# Patient Record
Sex: Female | Born: 1995 | Race: Black or African American | Hispanic: No | Marital: Single | Smoking: Never smoker
Health system: Southern US, Community
[De-identification: ages and names within clinical notes are randomized; demographics above are authoritative.]

## PROBLEM LIST (undated history)

## (undated) ENCOUNTER — Inpatient Hospital Stay (HOSPITAL_COMMUNITY): Payer: Self-pay

## (undated) DIAGNOSIS — O24419 Gestational diabetes mellitus in pregnancy, unspecified control: Secondary | ICD-10-CM

## (undated) DIAGNOSIS — Z8709 Personal history of other diseases of the respiratory system: Secondary | ICD-10-CM

## (undated) DIAGNOSIS — R0600 Dyspnea, unspecified: Secondary | ICD-10-CM

## (undated) DIAGNOSIS — R51 Headache: Secondary | ICD-10-CM

## (undated) DIAGNOSIS — R55 Syncope and collapse: Secondary | ICD-10-CM

## (undated) DIAGNOSIS — Z8659 Personal history of other mental and behavioral disorders: Secondary | ICD-10-CM

## (undated) HISTORY — DX: Gestational diabetes mellitus in pregnancy, unspecified control: O24.419

## (undated) HISTORY — DX: Dyspnea, unspecified: R06.00

## (undated) HISTORY — PX: NO PAST SURGERIES: SHX2092

## (undated) HISTORY — DX: Personal history of other mental and behavioral disorders: Z86.59

## (undated) HISTORY — DX: Headache: R51

## (undated) HISTORY — DX: Personal history of other diseases of the respiratory system: Z87.09

---
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
## 2013-03-07 ENCOUNTER — Inpatient Hospital Stay (HOSPITAL_COMMUNITY)
Admission: AD | Admit: 2013-03-07 | Discharge: 2013-03-07 | Disposition: A | Discharge status: Home / Self Care | Payer: Medicaid Other | Source: Ambulatory Visit | Attending: Obstetrics and Gynecology | Admitting: Obstetrics and Gynecology

## 2013-03-07 ENCOUNTER — Encounter (HOSPITAL_COMMUNITY): Payer: Self-pay

## 2013-03-07 ENCOUNTER — Inpatient Hospital Stay (HOSPITAL_COMMUNITY): Payer: Medicaid Other

## 2013-03-07 DIAGNOSIS — N926 Irregular menstruation, unspecified: Secondary | ICD-10-CM | POA: Insufficient documentation

## 2013-03-07 DIAGNOSIS — R109 Unspecified abdominal pain: Secondary | ICD-10-CM | POA: Insufficient documentation

## 2013-03-07 DIAGNOSIS — N946 Dysmenorrhea, unspecified: Secondary | ICD-10-CM

## 2013-03-07 DIAGNOSIS — N921 Excessive and frequent menstruation with irregular cycle: Secondary | ICD-10-CM

## 2013-03-07 DIAGNOSIS — N920 Excessive and frequent menstruation with regular cycle: Secondary | ICD-10-CM

## 2013-03-07 LAB — HCG, QUANTITATIVE, PREGNANCY: hCG, Beta Chain, Quant, S: <1 m[IU]/mL (ref <5)

## 2013-03-07 LAB — CBC
HCT: 38 % (ref 36.0–46.0)
MCHC: 33.4 g/dL (ref 30.0–36.0)
RDW: 14.5 % (ref 11.5–15.5)

## 2013-03-07 LAB — WET PREP, GENITAL
Trich, Wet Prep: NONE SEEN
Yeast Wet Prep HPF POC: NONE SEEN

## 2013-03-07 LAB — URINALYSIS, ROUTINE W REFLEX MICROSCOPIC
Nitrite: NEGATIVE
Protein, ur: NEGATIVE mg/dL
Urobilinogen, UA: 0.2 mg/dL (ref 0.0–1.0)

## 2013-03-07 LAB — URINE MICROSCOPIC-ADD ON

## 2013-03-07 LAB — ABO/RH: ABO/RH(D): O POS

## 2013-03-07 MED ORDER — NORGESTIMATE-ETH ESTRADIOL 0.25-35 MG-MCG PO TABS: 1.0000 | ORAL_TABLET | Freq: Every day | ORAL | Status: DC

## 2013-03-07 MED ORDER — IBUPROFEN 600 MG PO TABS: 600.0000 mg | ORAL_TABLET | Freq: Four times a day (QID) | ORAL | Status: DC | PRN

## 2013-03-07 NOTE — MAU Note (Signed)
Pt states here for abd pain in upper left side of abdomen that is constant, and lower abd pain that is intermittent. Used restroom yesterday and showed RN picture of clot/tissue?. Pt last had negative upt at home beginning of October.

## 2013-03-07 NOTE — MAU Provider Note (Signed)
Chief Complaint: Abdominal Pain   None    SUBJECTIVE HPI: Erin Wilkinson is a 17 y.o. G1P1000 who presents to maternity admissions reporting abdominal cramping and passing of a large clot, suspicious for miscarriage, in the toilet 2 days ago. She brought in photos and a video of the blood clot in the toilet.  She reports she tried to bring it in but it scared her and she dropped it back in the toilet. She reports her periods were regular until 4 months ago, and since July she has had only 2 episodes of bleeding which have been unusual since, one with just pink spotting x3-4 days, the other with heavy bleeding and clots x3 days.  She denies vaginal itching/burning, urinary symptoms, h/a, dizziness, n/v, or fever/chills.    Past Medical History  Diagnosis Date  . History of chronic bronchitis   . Asthma   . Headache(784.0)   . History of depression   . Gestational diabetes mellitus in pregnancy     new diagnosis 01/2011   History reviewed. No pertinent past surgical history. History   Social History  . Marital Status: Single    Spouse Name: N/A    Number of Children: N/A  . Years of Education: N/A   Occupational History  . Not on file.   Social History Main Topics  . Smoking status: Never Smoker   . Smokeless tobacco: Never Used  . Alcohol Use: No  . Drug Use: No  . Sexual Activity: Yes   Other Topics Concern  . Not on file   Social History Narrative  . No narrative on file   No current facility-administered medications on file prior to encounter.   No current outpatient prescriptions on file prior to encounter.   No Known Allergies  ROS: Pertinent items in HPI  OBJECTIVE Blood pressure 102/74, pulse 86, temperature 97.6 F (36.4 C), temperature source Oral, resp. rate 18, height 5\' 1"  (1.549 m), weight 96.276 kg (212 lb 4 oz). GENERAL: Well-developed, well-nourished female in no acute distress.  HEENT: Normocephalic HEART: normal rate RESP: normal  effort ABDOMEN: Soft, non-tender EXTREMITIES: Nontender, no edema NEURO: Alert and oriented Pelvic exam: Cervix pink, visually closed, without lesion, scant white creamy discharge, trace light brown bleeding, vaginal walls and external genitalia normal Bimanual exam: Cervix 0/long/high, firm, anterior, neg CMT, uterus nontender, nonenlarged, adnexa without tenderness, enlargement, or mass  LAB RESULTS Results for orders placed during the hospital encounter of 03/07/13 (from the past 24 hour(s))  URINALYSIS, ROUTINE W REFLEX MICROSCOPIC     Status: Abnormal   Collection Time    03/07/13 11:49 AM      Result Value Range   Color, Urine YELLOW  YELLOW   APPearance CLEAR  CLEAR   Specific Gravity, Urine >1.030 (*) 1.005 - 1.030   pH 6.0  5.0 - 8.0   Glucose, UA NEGATIVE  NEGATIVE mg/dL   Hgb urine dipstick LARGE (*) NEGATIVE   Bilirubin Urine NEGATIVE  NEGATIVE   Ketones, ur NEGATIVE  NEGATIVE mg/dL   Protein, ur NEGATIVE  NEGATIVE mg/dL   Urobilinogen, UA 0.2  0.0 - 1.0 mg/dL   Nitrite NEGATIVE  NEGATIVE   Leukocytes, UA SMALL (*) NEGATIVE  URINE MICROSCOPIC-ADD ON     Status: Abnormal   Collection Time    03/07/13 11:49 AM      Result Value Range   Squamous Epithelial / LPF FEW (*) RARE   WBC, UA 3-6  <3 WBC/hpf   RBC / HPF  3-6  <3 RBC/hpf   Bacteria, UA MANY (*) RARE   Urine-Other MUCOUS PRESENT    HCG, QUANTITATIVE, PREGNANCY     Status: None   Collection Time    03/07/13 12:24 PM      Result Value Range   hCG, Beta Chain, Quant, S <1  <5 mIU/mL  ABO/RH     Status: None   Collection Time    03/07/13 12:24 PM      Result Value Range   ABO/RH(D) O POS    CBC     Status: Abnormal   Collection Time    03/07/13 12:30 PM      Result Value Range   WBC 10.3  4.0 - 10.5 K/uL   RBC 5.33 (*) 3.87 - 5.11 MIL/uL   Hemoglobin 12.7  12.0 - 15.0 g/dL   HCT 57.8  46.9 - 62.9 %   MCV 71.3 (*) 78.0 - 100.0 fL   MCH 23.8 (*) 26.0 - 34.0 pg   MCHC 33.4  30.0 - 36.0 g/dL   RDW 52.8   41.3 - 24.4 %   Platelets 275  150 - 400 K/uL    IMAGING US Transvaginal Non-ob  03/07/2013   CLINICAL DATA:  Abnormal vaginal bleeding. Large clots. LMP July 2014.  EXAM: TRANSABDOMINAL AND TRANSVAGINAL ULTRASOUND OF PELVIS  TECHNIQUE: Both transabdominal and transvaginal ultrasound examinations of the pelvis were performed. Transabdominal technique was performed for global imaging of the pelvis including uterus, ovaries, adnexal regions, and pelvic cul-de-sac. It was necessary to proceed with endovaginal exam following the transabdominal exam to visualize the endometrium.  COMPARISON:  Obstetric ultrasound 02/10/2011  FINDINGS: Uterus  Measurements: Anteverted and measures 7.3 x 3.6 x 4.4 cm. Normal in size in echotexture. No evidence of mass. No fibroids or other mass visualized.  Endometrium  Thickness: Measures up to 1.5 cm and is diffusely homogeneous. No focal abnormality visualized.  Right ovary  Measurements: 3.8 x 2.0 x 2.6 cm. Normal appearance/no adnexal mass.  Left ovary  Measurements: 3.2 x 2.1 x 2.1 cm. Normal appearance/no adnexal mass.  Other findings  No free fluid.  IMPRESSION: 1. The endometrium is at the upper limits of normal for a premenopausal patient (1.5 cm). If there is clinical concern for a focal endometrial abnormality, consider followup pelvic ultrasound immediately following cessation of the menstrual cycle. 2. Normal ovaries.   Electronically Signed   By: Britta Mccreedy M.D.   On: 03/07/2013 16:41   US Pelvis Complete  03/07/2013   CLINICAL DATA:  Abnormal vaginal bleeding. Large clots. LMP July 2014.  EXAM: TRANSABDOMINAL AND TRANSVAGINAL ULTRASOUND OF PELVIS  TECHNIQUE: Both transabdominal and transvaginal ultrasound examinations of the pelvis were performed. Transabdominal technique was performed for global imaging of the pelvis including uterus, ovaries, adnexal regions, and pelvic cul-de-sac. It was necessary to proceed with endovaginal exam following the  transabdominal exam to visualize the endometrium.  COMPARISON:  Obstetric ultrasound 02/10/2011  FINDINGS: Uterus  Measurements: Anteverted and measures 7.3 x 3.6 x 4.4 cm. Normal in size in echotexture. No evidence of mass. No fibroids or other mass visualized.  Endometrium  Thickness: Measures up to 1.5 cm and is diffusely homogeneous. No focal abnormality visualized.  Right ovary  Measurements: 3.8 x 2.0 x 2.6 cm. Normal appearance/no adnexal mass.  Left ovary  Measurements: 3.2 x 2.1 x 2.1 cm. Normal appearance/no adnexal mass.  Other findings  No free fluid.  IMPRESSION: 1. The endometrium is at the upper limits of normal for  a premenopausal patient (1.5 cm). If there is clinical concern for a focal endometrial abnormality, consider followup pelvic ultrasound immediately following cessation of the menstrual cycle. 2. Normal ovaries.   Electronically Signed   By: Britta Mccreedy M.D.   On: 03/07/2013 16:41    ASSESSMENT 1. Menorrhagia with irregular cycle   2. Dysmenorrhea     PLAN Discharge home Sprintec 28 with 3 refills Outpatient f/u ultrasound ordered in 2 months F/U in clinic in 2-3 months  Return to MAU as needed   Medication List    ASK your doctor about these medications       GOODY HEADACHE PO  Take 1 tablet by mouth as needed. pain         Sharen Counter Certified Nurse-Midwife 03/07/2013  2:48 PM

## 2013-03-08 LAB — URINE CULTURE: Colony Count: NO GROWTH

## 2013-03-09 NOTE — MAU Provider Note (Signed)
Attestation of Attending Supervision of Advanced Practitioner: Evaluation and management procedures were performed by the PA/NP/CNM/OB Fellow under my supervision/collaboration. Chart reviewed and agree with management and plan.  Tilda Burrow 03/09/2013 9:36 PM

## 2013-03-11 ENCOUNTER — Encounter (HOSPITAL_COMMUNITY): Payer: Self-pay | Admitting: *Deleted

## 2013-03-18 ENCOUNTER — Encounter: Payer: Self-pay | Admitting: Nurse Practitioner

## 2013-03-19 ENCOUNTER — Telehealth: Payer: Self-pay | Admitting: *Deleted

## 2013-03-19 DIAGNOSIS — A749 Chlamydial infection, unspecified: Secondary | ICD-10-CM

## 2013-03-19 MED ORDER — AZITHROMYCIN 250 MG PO TABS: 1000.0000 mg | ORAL_TABLET | Freq: Once | ORAL | Status: DC

## 2013-03-19 NOTE — Telephone Encounter (Signed)
Message copied by Mannie Stabile on Wed Mar 19, 2013  8:22 AM ------      Message from: [place],  W      Created: Wed Mar 19, 2013  7:57 AM       She uses [employer].  ------

## 2013-03-19 NOTE — Telephone Encounter (Signed)
Message copied by Mannie Stabile on Wed Mar 19, 2013  8:22 AM ------      Message from: [place], [place] W      Created: Wed Mar 19, 2013  7:54 AM       Needs Rx called in for positive chlamydia.  ------

## 2013-05-04 ENCOUNTER — Inpatient Hospital Stay (HOSPITAL_COMMUNITY)
Admission: AD | Admit: 2013-05-04 | Discharge: 2013-05-04 | Disposition: A | Discharge status: Home / Self Care | Payer: Medicaid Other | Source: Ambulatory Visit | Attending: Obstetrics and Gynecology | Admitting: Obstetrics and Gynecology

## 2013-05-04 ENCOUNTER — Encounter (HOSPITAL_COMMUNITY): Payer: Self-pay | Admitting: *Deleted

## 2013-05-04 DIAGNOSIS — R109 Unspecified abdominal pain: Secondary | ICD-10-CM | POA: Insufficient documentation

## 2013-05-04 DIAGNOSIS — N938 Other specified abnormal uterine and vaginal bleeding: Secondary | ICD-10-CM | POA: Insufficient documentation

## 2013-05-04 DIAGNOSIS — N949 Unspecified condition associated with female genital organs and menstrual cycle: Secondary | ICD-10-CM | POA: Insufficient documentation

## 2013-05-04 DIAGNOSIS — N390 Urinary tract infection, site not specified: Secondary | ICD-10-CM

## 2013-05-04 LAB — WET PREP, GENITAL
Trich, Wet Prep: NONE SEEN
YEAST WET PREP: NONE SEEN

## 2013-05-04 LAB — URINALYSIS, ROUTINE W REFLEX MICROSCOPIC
Glucose, UA: NEGATIVE mg/dL
Ketones, ur: 15 mg/dL — AB
LEUKOCYTES UA: NEGATIVE
Nitrite: POSITIVE — AB
Protein, ur: 100 mg/dL — AB
UROBILINOGEN UA: 1 mg/dL (ref 0.0–1.0)
pH: 5.5 (ref 5.0–8.0)

## 2013-05-04 LAB — URINE MICROSCOPIC-ADD ON

## 2013-05-04 LAB — HIV ANTIBODY (ROUTINE TESTING W REFLEX): HIV: NONREACTIVE

## 2013-05-04 LAB — POCT PREGNANCY, URINE: PREG TEST UR: NEGATIVE

## 2013-05-04 MED ORDER — CIPROFLOXACIN HCL 500 MG PO TABS: 500.0000 mg | ORAL_TABLET | Freq: Two times a day (BID) | ORAL | Status: DC

## 2013-05-04 MED ORDER — NORGESTIMATE-ETH ESTRADIOL 0.25-35 MG-MCG PO TABS: 1.0000 | ORAL_TABLET | Freq: Every day | ORAL | Status: DC

## 2013-05-04 NOTE — Discharge Instructions (Signed)
Urinary Tract Infection  Urinary tract infections (UTIs) can develop anywhere along your urinary tract. Your urinary tract is your body's drainage system for removing wastes and extra water. Your urinary tract includes two kidneys, two ureters, a bladder, and a urethra. Your kidneys are a pair of bean-shaped organs. Each kidney is about the size of your fist. They are located below your ribs, one on each side of your spine.  CAUSES  Infections are caused by microbes, which are microscopic organisms, including fungi, viruses, and bacteria. These organisms are so small that they can only be seen through a microscope. Bacteria are the microbes that most commonly cause UTIs.  SYMPTOMS   Symptoms of UTIs may vary by age and gender of the patient and by the location of the infection. Symptoms in young women typically include a frequent and intense urge to urinate and a painful, burning feeling in the bladder or urethra during urination. Older women and men are more likely to be tired, shaky, and weak and have muscle aches and abdominal pain. A fever may mean the infection is in your kidneys. Other symptoms of a kidney infection include pain in your back or sides below the ribs, nausea, and vomiting.  DIAGNOSIS  To diagnose a UTI, your caregiver will ask you about your symptoms. Your caregiver also will ask to provide a urine sample. The urine sample will be tested for bacteria and white blood cells. White blood cells are made by your body to help fight infection.  TREATMENT   Typically, UTIs can be treated with medication. Because most UTIs are caused by a bacterial infection, they usually can be treated with the use of antibiotics. The choice of antibiotic and length of treatment depend on your symptoms and the type of bacteria causing your infection.  HOME CARE INSTRUCTIONS   If you were prescribed antibiotics, take them exactly as your caregiver instructs you. Finish the medication even if you feel better after you  have only taken some of the medication.   Drink enough water and fluids to keep your urine clear or pale yellow.   Avoid caffeine, tea, and carbonated beverages. They tend to irritate your bladder.   Empty your bladder often. Avoid holding urine for long periods of time.   Empty your bladder before and after sexual intercourse.   After a bowel movement, women should cleanse from front to back. Use each tissue only once.  SEEK MEDICAL CARE IF:    You have back pain.   You develop a fever.   Your symptoms do not begin to resolve within 3 days.  SEEK IMMEDIATE MEDICAL CARE IF:    You have severe back pain or lower abdominal pain.   You develop chills.   You have nausea or vomiting.   You have continued burning or discomfort with urination.  MAKE SURE YOU:    Understand these instructions.   Will watch your condition.   Will get help right away if you are not doing well or get worse.  Document Released: 01/04/2005 Document Revised: 09/26/2011 Document Reviewed: 05/05/2011  [employer] Patient Information 2014 [employer].

## 2013-05-04 NOTE — MAU Provider Note (Signed)
History     CSN: [id number]  Arrival date and time: 05/04/13 1521   First Provider Initiated Contact with Patient 05/04/13 1608      Chief Complaint  Patient presents with  . Vaginal Bleeding  . Abdominal Pain   HPI  Erin Wilkinson is a.age G1P1000 who presents today because she passed a blood clot this morning. She states that she had pain last night, but she does not have pain currently. She states that she has an appointment in the clinic on 05/09/13. She states that she wants STD testing, and would like HIV testing.   Past Medical History  Diagnosis Date  . History of chronic bronchitis   . Asthma   . Headache(784.0)   . History of depression   . Gestational diabetes mellitus in pregnancy     new diagnosis 01/2011    Past Surgical History  Procedure Laterality Date  . No past surgeries      Family History  Problem Relation Age of Onset  . Asthma Mother   . Depression Mother   . Miscarriages / [place] Mother   . Cancer Maternal Uncle   . Diabetes Maternal Grandmother   . Hypertension Maternal Grandmother   . Arthritis Paternal Grandmother   . Stroke Paternal Grandmother     History  Substance Use Topics  . Smoking status: Never Smoker   . Smokeless tobacco: Never Used  . Alcohol Use: No    Allergies: No Known Allergies  Prescriptions prior to admission  Medication Sig Dispense Refill  . norgestimate-ethinyl estradiol (ORTHO-CYCLEN,SPRINTEC,PREVIFEM) 0.25-35 MG-MCG tablet Take 1 tablet by mouth daily.  1 Package  3    ROS Physical Exam   Blood pressure 135/73, pulse 78, temperature 98.1 F (36.7 C), temperature source Oral, resp. rate 18, height 5\' 2"  (1.575 m), weight 91.627 kg (202 lb), last menstrual period 04/28/2013.  Physical Exam  MAU Course  Procedures  Results for orders placed during the hospital encounter of 05/04/13 (from the past 24 hour(s))  URINALYSIS, ROUTINE W REFLEX MICROSCOPIC     Status: Abnormal   Collection Time   05/04/13  3:50 PM      Result Value Range   Color, Urine AMBER (*) YELLOW   APPearance HAZY (*) CLEAR   Specific Gravity, Urine >1.030 (*) 1.005 - 1.030   pH 5.5  5.0 - 8.0   Glucose, UA NEGATIVE  NEGATIVE mg/dL   Hgb urine dipstick LARGE (*) NEGATIVE   Bilirubin Urine SMALL (*) NEGATIVE   Ketones, ur 15 (*) NEGATIVE mg/dL   Protein, ur 409 (*) NEGATIVE mg/dL   Urobilinogen, UA 1.0  0.0 - 1.0 mg/dL   Nitrite POSITIVE (*) NEGATIVE   Leukocytes, UA NEGATIVE  NEGATIVE  URINE MICROSCOPIC-ADD ON     Status: Abnormal   Collection Time    05/04/13  3:50 PM      Result Value Range   Squamous Epithelial / LPF FEW (*) RARE   WBC, UA 3-6  <3 WBC/hpf   RBC / HPF TOO NUMEROUS TO COUNT  <3 RBC/hpf   Bacteria, UA FEW (*) RARE   Urine-Other MUCOUS PRESENT    POCT PREGNANCY, URINE     Status: None   Collection Time    05/04/13  3:56 PM      Result Value Range   Preg Test, Ur NEGATIVE  NEGATIVE  WET PREP, GENITAL     Status: Abnormal   Collection Time    05/04/13  4:17 PM  Result Value Range   Yeast Wet Prep HPF POC NONE SEEN  NONE SEEN   Trich, Wet Prep NONE SEEN  NONE SEEN   Clue Cells Wet Prep HPF POC FEW (*) NONE SEEN   WBC, Wet Prep HPF POC FEW (*) NONE SEEN     Assessment and Plan   1. UTI (lower urinary tract infection)      Medication List         ciprofloxacin 500 MG tablet  Commonly known as:  CIPRO  Take 1 tablet (500 mg total) by mouth 2 (two) times daily.     norgestimate-ethinyl estradiol 0.25-35 MG-MCG tablet  Commonly known as:  ORTHO-CYCLEN,SPRINTEC,PREVIFEM  Take 1 tablet by mouth daily.       Follow-up Information   [facility] (as scheduled )    Specialty:  Obstetrics and Gynecology   Contact information:   [address] [place] [id number] [phone]       Tawnya CrookHogan, Serah Nicoletti Donovan 05/04/2013, 4:13 PM

## 2013-05-04 NOTE — MAU Note (Signed)
Pt presents with complaints of abdominal pressure that started last night and today she passed a large, bright red  clot Pt states that she had her period last week and has continued to bleed into this week. Denies

## 2013-05-05 LAB — GC/CHLAMYDIA PROBE AMP
CT Probe RNA: NEGATIVE
GC Probe RNA: NEGATIVE

## 2013-05-06 LAB — URINE CULTURE: Colony Count: 70000

## 2013-05-07 NOTE — MAU Provider Note (Signed)
Attestation of Attending Supervision of Advanced Practitioner: Evaluation and management procedures were performed by the PA/NP/CNM/OB Fellow under my supervision/collaboration. Chart reviewed and agree with management and plan.  Tilda BurrowFERGUSON,Keaunna Skipper V 05/07/2013 5:28 PM  Attestation of Attending Supervision of Advanced Practitioner: Evaluation and management procedures were performed by the PA/NP/CNM/OB Fellow under my supervision/collaboration. Chart reviewed and agree with management and plan.  Tilda BurrowFERGUSON,Dimitrius Steedman V 05/07/2013 5:28 PM  Attestation of Attending Supervision of Advanced Practitioner: Evaluation and management procedures were performed by the PA/NP/CNM/OB Fellow under my supervision/collaboration. Chart reviewed and agree with management and plan.  Tilda BurrowFERGUSON,Meryl Hubers V 05/07/2013 5:28 PM  Attestation of Attending Supervision of Advanced Practitioner: Evaluation and management procedures were performed by the PA/NP/CNM/OB Fellow under my supervision/collaboration. Chart reviewed and agree with management and plan.  Rilynn Habel V 05/07/2013 5:28 PM

## 2013-05-09 ENCOUNTER — Encounter: Payer: Medicaid Other | Admitting: Family Medicine

## 2013-05-09 ENCOUNTER — Encounter: Payer: Medicaid Other | Admitting: Nurse Practitioner

## 2013-06-16 ENCOUNTER — Telehealth: Payer: Self-pay | Admitting: General Practice

## 2013-06-16 ENCOUNTER — Encounter: Payer: Medicaid Other | Admitting: Obstetrics & Gynecology

## 2013-06-16 ENCOUNTER — Encounter: Payer: Self-pay | Admitting: Obstetrics & Gynecology

## 2013-06-16 NOTE — Telephone Encounter (Signed)
Called patient because she no showed for her appt today, also no showed for 1/30 appt, Called patient, no answer- left message stating we are calling about her appt that she missed today, if she would like to reschedule this appt please call our front office staff in order to do so.

## 2013-08-04 ENCOUNTER — Ambulatory Visit (INDEPENDENT_AMBULATORY_CARE_PROVIDER_SITE_OTHER): Payer: Medicaid Other | Admitting: Nurse Practitioner

## 2013-08-04 ENCOUNTER — Encounter: Payer: Self-pay | Admitting: Nurse Practitioner

## 2013-08-04 VITALS — BP 108/74 | HR 85 | Temp 99.4°F | Ht 61.0 in | Wt 207.0 lb

## 2013-08-04 DIAGNOSIS — E669 Obesity, unspecified: Secondary | ICD-10-CM

## 2013-08-04 DIAGNOSIS — Z113 Encounter for screening for infections with a predominantly sexual mode of transmission: Secondary | ICD-10-CM

## 2013-08-04 DIAGNOSIS — Z309 Encounter for contraceptive management, unspecified: Secondary | ICD-10-CM | POA: Insufficient documentation

## 2013-08-04 DIAGNOSIS — O99210 Obesity complicating pregnancy, unspecified trimester: Secondary | ICD-10-CM | POA: Insufficient documentation

## 2013-08-04 MED ORDER — NORGESTIMATE-ETH ESTRADIOL 0.25-35 MG-MCG PO TABS: 1.0000 | ORAL_TABLET | Freq: Every day | ORAL | Status: DC

## 2013-08-04 NOTE — Progress Notes (Signed)
History:  Erin Rifeequiara E Perezis a 18 y.o. G1P1001 who [facility] for follow up on vaginal bleeding, contraception and STD screening. She is currently on her 4 th package of birth control pills and doing better. She is now only bleeding on her menses times and changing her tampon every 4 hours. She has some clots. She would like to stay on BCPs and does not want any more children. New partner in past 1 year and she would like STD screening today  The following portions of the patient's history were reviewed and updated as appropriate: allergies, current medications, past family history, past medical history, past social history, past surgical history and problem list.  Review of Systems:  Pertinent items are noted in HPI.  Objective:  Physical Exam BP 108/74  Pulse 85  Temp(Src) 99.4 F (37.4 C) (Oral)  Ht 5\' 1"  (1.549 m)  Wt 207 lb (93.895 kg)  BMI 39.13 kg/m2  LMP 07/27/2013 GENERAL: Well-developed, well-nourished female in no acute distress. Obese HEENT: Normocephalic, atraumatic.  ABDOMEN: Soft, nontender, nondistended. No organomegaly. Normal bowel sounds appreciated in all quadrants.  PELVIC: Normal external female genitalia. Vagina is pink and rugated.  Normal discharge. Normal cervix contour. No adnexal mass or tenderness. Uterus is nontender and no bleeding noted EXTREMITIES: No cyanosis, clubbing, or edema, 2+ distal pulses.   Labs and Imaging No results found for this or any previous visit (from the past 24 hour(s)).   Assessment & Plan:  Assessment:  Contraception Obesity  Plans:  Advised on taking BCPs daily and menses is normal GC/Chlamydia done today Refill Orthocyclen daily/ 11 refills Follow up prn or one year  Delbert PhenixLinda M Somnang Mahan, NP 08/04/2013 3:15 PM

## 2013-08-04 NOTE — Patient Instructions (Signed)
Contraception Choices Contraception (birth control) is the use of any methods or devices to prevent pregnancy. Below are some methods to help avoid pregnancy. HORMONAL METHODS   Contraceptive implant This is a thin, plastic tube containing progesterone hormone. It does not contain estrogen hormone. Your health care provider inserts the tube in the inner part of the upper arm. The tube can remain in place for up to 3 years. After 3 years, the implant must be removed. The implant prevents the ovaries from releasing an egg (ovulation), thickens the cervical mucus to prevent sperm from entering the uterus, and thins the lining of the inside of the uterus.  Progesterone-only injections These injections are given every 3 months by your health care provider to prevent pregnancy. This synthetic progesterone hormone stops the ovaries from releasing eggs. It also thickens cervical mucus and changes the uterine lining. This makes it harder for sperm to survive in the uterus.  Birth control pills These pills contain estrogen and progesterone hormone. They work by preventing the ovaries from releasing eggs (ovulation). They also cause the cervical mucus to thicken, preventing the sperm from entering the uterus. Birth control pills are prescribed by a health care provider.Birth control pills can also be used to treat heavy periods.  Minipill This type of birth control pill contains only the progesterone hormone. They are taken every day of each month and must be prescribed by your health care provider.  Birth control patch The patch contains hormones similar to those in birth control pills. It must be changed once a week and is prescribed by a health care provider.  Vaginal ring The ring contains hormones similar to those in birth control pills. It is left in the vagina for 3 weeks, removed for 1 week, and then a new one is put back in place. The patient must be comfortable inserting and removing the ring from the  vagina.A health care provider's prescription is necessary.  Emergency contraception Emergency contraceptives prevent pregnancy after unprotected sexual intercourse. This pill can be taken right after sex or up to 5 days after unprotected sex. It is most effective the sooner you take the pills after having sexual intercourse. Most emergency contraceptive pills are available without a prescription. Check with your pharmacist. Do not use emergency contraception as your only form of birth control. BARRIER METHODS   Female condom This is a thin sheath (latex or rubber) that is worn over the penis during sexual intercourse. It can be used with spermicide to increase effectiveness.  Female condom. This is a soft, loose-fitting sheath that is put into the vagina before sexual intercourse.  Diaphragm This is a soft, latex, dome-shaped barrier that must be fitted by a health care provider. It is inserted into the vagina, along with a spermicidal jelly. It is inserted before intercourse. The diaphragm should be left in the vagina for 6 to 8 hours after intercourse.  Cervical cap This is a round, soft, latex or plastic cup that fits over the cervix and must be fitted by a health care provider. The cap can be left in place for up to 48 hours after intercourse.  Sponge This is a soft, circular piece of polyurethane foam. The sponge has spermicide in it. It is inserted into the vagina after wetting it and before sexual intercourse.  Spermicides These are chemicals that kill or block sperm from entering the cervix and uterus. They come in the form of creams, jellies, suppositories, foam, or tablets. They do not require a   prescription. They are inserted into the vagina with an applicator before having sexual intercourse. The process must be repeated every time you have sexual intercourse. INTRAUTERINE CONTRACEPTION  Intrauterine device (IUD) This is a T-shaped device that is put in a woman's uterus during a  menstrual period to prevent pregnancy. There are 2 types:  Copper IUD This type of IUD is wrapped in copper wire and is placed inside the uterus. Copper makes the uterus and fallopian tubes produce a fluid that kills sperm. It can stay in place for 10 years.  Hormone IUD This type of IUD contains the hormone progestin (synthetic progesterone). The hormone thickens the cervical mucus and prevents sperm from entering the uterus, and it also thins the uterine lining to prevent implantation of a fertilized egg. The hormone can weaken or kill the sperm that get into the uterus. It can stay in place for 3 5 years, depending on which type of IUD is used. PERMANENT METHODS OF CONTRACEPTION  Female tubal ligation This is when the woman's fallopian tubes are surgically sealed, tied, or blocked to prevent the egg from traveling to the uterus.  Hysteroscopic sterilization This involves placing a small coil or insert into each fallopian tube. Your doctor uses a technique called hysteroscopy to do the procedure. The device causes scar tissue to form. This results in permanent blockage of the fallopian tubes, so the sperm cannot fertilize the egg. It takes about 3 months after the procedure for the tubes to become blocked. You must use another form of birth control for these 3 months.  Female sterilization This is when the female has the tubes that carry sperm tied off (vasectomy).This blocks sperm from entering the vagina during sexual intercourse. After the procedure, the man can still ejaculate fluid (semen). NATURAL PLANNING METHODS  Natural family planning This is not having sexual intercourse or using a barrier method (condom, diaphragm, cervical cap) on days the woman could become pregnant.  Calendar method This is keeping track of the length of each menstrual cycle and identifying when you are fertile.  Ovulation method This is avoiding sexual intercourse during ovulation.  Symptothermal method This is  avoiding sexual intercourse during ovulation, using a thermometer and ovulation symptoms.  Post ovulation method This is timing sexual intercourse after you have ovulated. Regardless of which type or method of contraception you choose, it is important that you use condoms to protect against the transmission of sexually transmitted infections (STIs). Talk with your health care provider about which form of contraception is most appropriate for you. Document Released: 03/27/2005 Document Revised: 11/27/2012 Document Reviewed: 09/19/2012 [employer] Patient Information 2014 [employer].  

## 2013-08-05 LAB — GC/CHLAMYDIA PROBE AMP
CT PROBE, AMP APTIMA: POSITIVE — AB
GC PROBE AMP APTIMA: NEGATIVE

## 2013-08-06 ENCOUNTER — Telehealth: Payer: Self-pay

## 2013-08-06 MED ORDER — AZITHROMYCIN 500 MG PO TABS: 1000.0000 mg | ORAL_TABLET | Freq: Once | ORAL | Status: DC

## 2013-08-06 NOTE — Telephone Encounter (Signed)
Called pt with + chlamydia results [facility] [address] [facility] treatment up and take all pills at once.  She needs to make sure that she has her partner(s) treated as well to abstain from intercourse until she knows that he is treated.  That they can go to Urgent Care or [facility].  Pt stated understanding.  [facility] STD faxed.

## 2013-08-14 ENCOUNTER — Telehealth: Payer: Self-pay
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
## 2013-08-17 ENCOUNTER — Emergency Department (HOSPITAL_COMMUNITY)
Admission: EM | Admit: 2013-08-17 | Discharge: 2013-08-17 | Disposition: A | Discharge status: Home / Self Care | Payer: No Typology Code available for payment source | Attending: Emergency Medicine | Admitting: Emergency Medicine

## 2013-08-17 ENCOUNTER — Emergency Department (HOSPITAL_COMMUNITY): Payer: No Typology Code available for payment source

## 2013-08-17 ENCOUNTER — Encounter (HOSPITAL_COMMUNITY): Payer: Self-pay | Admitting: Emergency Medicine

## 2013-08-17 ENCOUNTER — Other Ambulatory Visit (HOSPITAL_COMMUNITY): Payer: Medicaid Other

## 2013-08-17 DIAGNOSIS — Z79899 Other long term (current) drug therapy: Secondary | ICD-10-CM | POA: Insufficient documentation

## 2013-08-17 DIAGNOSIS — Z792 Long term (current) use of antibiotics: Secondary | ICD-10-CM | POA: Insufficient documentation

## 2013-08-17 DIAGNOSIS — IMO0002 Reserved for concepts with insufficient information to code with codable children: Secondary | ICD-10-CM | POA: Insufficient documentation

## 2013-08-17 DIAGNOSIS — J45909 Unspecified asthma, uncomplicated: Secondary | ICD-10-CM | POA: Insufficient documentation

## 2013-08-17 DIAGNOSIS — Y9389 Activity, other specified: Secondary | ICD-10-CM | POA: Insufficient documentation

## 2013-08-17 DIAGNOSIS — S1091XA Abrasion of unspecified part of neck, initial encounter: Secondary | ICD-10-CM

## 2013-08-17 DIAGNOSIS — S139XXA Sprain of joints and ligaments of unspecified parts of neck, initial encounter: Secondary | ICD-10-CM | POA: Insufficient documentation

## 2013-08-17 DIAGNOSIS — Y92410 Unspecified street and highway as the place of occurrence of the external cause: Secondary | ICD-10-CM | POA: Insufficient documentation

## 2013-08-17 DIAGNOSIS — S161XXA Strain of muscle, fascia and tendon at neck level, initial encounter: Secondary | ICD-10-CM

## 2013-08-17 DIAGNOSIS — Z8659 Personal history of other mental and behavioral disorders: Secondary | ICD-10-CM | POA: Insufficient documentation

## 2013-08-17 LAB — I-STAT CREATININE, ED: Creatinine, Ser: 0.8 mg/dL (ref 0.50–1.10)

## 2013-08-17 MED ORDER — IOHEXOL 350 MG/ML SOLN
50.0000 mL | Freq: Once | INTRAVENOUS | Status: AC | PRN
  Administered 2013-08-17: 50 mL via INTRAVENOUS

## 2013-08-17 MED ORDER — METHOCARBAMOL 500 MG PO TABS: 500.0000 mg | ORAL_TABLET | Freq: Two times a day (BID) | ORAL | Status: DC

## 2013-08-17 MED ORDER — HYDROCODONE-ACETAMINOPHEN 5-325 MG PO TABS: 1.0000 | ORAL_TABLET | Freq: Four times a day (QID) | ORAL | Status: DC | PRN

## 2013-08-17 MED ORDER — NAPROXEN 500 MG PO TABS: 500.0000 mg | ORAL_TABLET | Freq: Two times a day (BID) | ORAL | Status: DC
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
## 2013-08-17 NOTE — ED Provider Notes (Signed)
Medical screening examination/treatment/procedure(s) were performed by non-physician practitioner and as supervising physician I was immediately available for consultation/collaboration.   EKG Interpretation None        Dagmar HaitWilliam Markey Deady, MD 08/17/13 [phone]
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
## 2013-08-17 NOTE — Discharge Instructions (Signed)
Recommend Naproxen and Robaxin for symptoms. Your may take Norco for severe pain. Apply ice to the area as instructed. Follow up with your primary doctor.  Abrasion An abrasion is a cut or scrape of the skin. Abrasions do not extend through all layers of the skin and most heal within 10 days. It is important to care for your abrasion properly to prevent infection. CAUSES  Most abrasions are caused by falling on, or gliding across, the ground or other surface. When your skin rubs on something, the outer and inner layer of skin rubs off, causing an abrasion. DIAGNOSIS  Your caregiver will be able to diagnose an abrasion during a physical exam.  TREATMENT  Your treatment depends on how large and deep the abrasion is. Generally, your abrasion will be cleaned with water and a mild soap to remove any dirt or debris. An antibiotic ointment may be put over the abrasion to prevent an infection. A bandage (dressing) may be wrapped around the abrasion to keep it from getting dirty.  You may need a tetanus shot if:  You cannot remember when you had your last tetanus shot.  You have never had a tetanus shot.  The injury broke your skin. If you get a tetanus shot, your arm may swell, get red, and feel warm to the touch. This is common and not a problem. If you need a tetanus shot and you choose not to have one, there is a rare chance of getting tetanus. Sickness from tetanus can be serious.  HOME CARE INSTRUCTIONS   If a dressing was applied, change it at least once a day or as directed by your caregiver. If the bandage sticks, soak it off with warm water.   Wash the area with water and a mild soap to remove all the ointment 2 times a day. Rinse off the soap and pat the area dry with a clean towel.   Reapply any ointment as directed by your caregiver. This will help prevent infection and keep the bandage from sticking. Use gauze over the wound and under the dressing to help keep the bandage from  sticking.   Change your dressing right away if it becomes wet or dirty.   Only take over-the-counter or prescription medicines for pain, discomfort, or fever as directed by your caregiver.   Follow up with your caregiver within 24 48 hours for a wound check, or as directed. If you were not given a wound-check appointment, look closely at your abrasion for redness, swelling, or pus. These are signs of infection. SEEK IMMEDIATE MEDICAL CARE IF:   You have increasing pain in the wound.   You have redness, swelling, or tenderness around the wound.   You have pus coming from the wound.   You have a fever or persistent symptoms for more than 2 3 days.  You have a fever and your symptoms suddenly get worse.  You have a bad smell coming from the wound or dressing.  MAKE SURE YOU:   Understand these instructions.  Will watch your condition.  Will get help right away if you are not doing well or get worse. Document Released: 01/04/2005 Document Revised: 03/13/2012 Document Reviewed: 02/28/2011 [facility] Patient Information 2014 [place] Center[place]Care, [place].  Cervical Sprain A cervical sprain is when the tissues (ligaments) that hold the neck bones in place stretch or tear. HOME CARE   Put ice on the injured area.  Put ice in a plastic bag.  Place a towel between your skin and  the bag.  Leave the ice on for 15 20 minutes, 3 4 times a day.  You may have been given a collar to wear. This collar keeps your neck from moving while you heal.  Do not take the collar off unless told by your doctor.  If you have long hair, keep it outside of the collar.  Ask your doctor before changing the position of your collar. You may need to change its position over time to make it more comfortable.  If you are allowed to take off the collar for cleaning or bathing, follow your doctor's instructions on how to do it safely.  Keep your collar clean by wiping it with mild soap and water. Dry it completely.  If the collar has removable pads, remove them every 1 2 days to hand wash them with soap and water. Allow them to air dry. They should be dry before you wear them in the collar.  Do not drive while wearing the collar.  Only take medicine as told by your doctor.  Keep all doctor visits as told.  Keep all physical therapy visits as told.  Adjust your work station so that you have good posture while you work.  Avoid positions and activities that make your problems worse.  Warm up and stretch before being active. GET HELP IF:  Your pain is not controlled with medicine.  You cannot take less pain medicine over time as planned.  Your activity level does not improve as expected. GET HELP RIGHT AWAY IF:   You are bleeding.  Your stomach is upset.  You have an allergic reaction to your medicine.  You develop new problems that you cannot explain.  You lose feeling (become numb) or you cannot move any part of your body (paralysis).  You have tingling or weakness in any part of your body.  Your symptoms get worse. Symptoms include:  Pain, soreness, stiffness, puffiness (swelling), or a burning feeling in your neck.  Pain when your neck is touched.  Shoulder or upper back pain.  Limited ability to move your neck.  Headache.  Dizziness.  Your hands or arms feel week, lose feeling, or tingle.  Muscle spasms.  Difficulty swallowing or chewing. MAKE SURE YOU:   Understand these instructions.  Will watch your condition.  Will get help right away if you are not doing well or get worse. Document Released: 09/13/2007 Document Revised: 11/27/2012 Document Reviewed: 10/02/2012 [facility] Patient Information 2014 [place], [place].

## 2013-12-16 ENCOUNTER — Encounter: Payer: Self-pay | Admitting: General Practice

## 2014-02-09 ENCOUNTER — Encounter (HOSPITAL_COMMUNITY): Payer: Self-pay | Admitting: Emergency Medicine

## 2014-04-21 ENCOUNTER — Ambulatory Visit: Payer: Medicaid Other | Admitting: Obstetrics

## 2014-06-05 ENCOUNTER — Ambulatory Visit (INDEPENDENT_AMBULATORY_CARE_PROVIDER_SITE_OTHER): Payer: Medicaid Other | Admitting: Certified Nurse Midwife

## 2014-06-05 ENCOUNTER — Encounter: Payer: Self-pay | Admitting: Certified Nurse Midwife

## 2014-06-05 VITALS — BP 114/78 | HR 82 | Temp 97.7°F | Ht 62.0 in | Wt 222.0 lb

## 2014-06-05 DIAGNOSIS — N939 Abnormal uterine and vaginal bleeding, unspecified: Secondary | ICD-10-CM | POA: Diagnosis not present

## 2014-06-05 LAB — CBC WITH DIFFERENTIAL/PLATELET
BASOS ABS: 0 10*3/uL (ref 0.0–0.1)
Basophils Relative: 0 % (ref 0–1)
EOS ABS: 0.4 10*3/uL (ref 0.0–0.7)
Eosinophils Relative: 5 % (ref 0–5)
HCT: 37.7 % (ref 36.0–46.0)
HEMOGLOBIN: 12 g/dL (ref 12.0–15.0)
Lymphocytes Relative: 31 % (ref 12–46)
Lymphs Abs: 2.5 10*3/uL (ref 0.7–4.0)
MCH: 22.5 pg — AB (ref 26.0–34.0)
MCHC: 31.8 g/dL (ref 30.0–36.0)
MCV: 70.7 fL — ABNORMAL LOW (ref 78.0–100.0)
MONOS PCT: 7 % (ref 3–12)
MPV: 10.2 fL (ref 8.6–12.4)
Monocytes Absolute: 0.6 10*3/uL (ref 0.1–1.0)
Neutro Abs: 4.6 10*3/uL (ref 1.7–7.7)
Neutrophils Relative %: 57 % (ref 43–77)
Platelets: 297 10*3/uL (ref 150–400)
RBC: 5.33 MIL/uL — ABNORMAL HIGH (ref 3.87–5.11)
RDW: 15.6 % — ABNORMAL HIGH (ref 11.5–15.5)
WBC: 8.1 10*3/uL (ref 4.0–10.5)

## 2014-06-05 LAB — TSH: TSH: 0.054 u[IU]/mL — ABNORMAL LOW (ref 0.350–4.500)

## 2014-06-05 MED ORDER — IBUPROFEN 800 MG PO TABS: 800.0000 mg | ORAL_TABLET | Freq: Three times a day (TID) | ORAL | Status: DC | PRN

## 2014-06-05 NOTE — Progress Notes (Signed)
Patient ID: Erin Wilkinson Conkey, female   DOB: Aug 23, 1995, 19 y.o.   MRN: [id number]   Chief Complaint  Patient presents with  . Menstrual Problem    HPI Erin Wilkinson Heaslip is a 19 y.o. female.  C/O: irregular, heavy periods lasting 2 months. LMP started mid December and is still menstruating. Before December no period for 6 months.  G1P1. Doesn't desire hormonal contraception, desires hysterectomy.     HPI  Past Medical History  Diagnosis Date  . History of chronic bronchitis   . Asthma   . Headache(784.0)   . History of depression   . Gestational diabetes mellitus in pregnancy     new diagnosis 01/2011    Past Surgical History  Procedure Laterality Date  . No past surgeries      Family History  Problem Relation Age of Onset  . Asthma Mother   . Depression Mother   . Miscarriages / [place]   . Cancer Maternal Uncle   . Diabetes Maternal Grandmother   . Hypertension Maternal Grandmother   . Arthritis Paternal Grandmother   . Stroke Paternal Grandmother     Social History History  Substance Use Topics  . Smoking status: Never Smoker   . Smokeless tobacco: Never Used  . Alcohol Use: No    No Known Allergies  Current Outpatient Prescriptions  Medication Sig Dispense Refill  . ibuprofen (ADVIL,MOTRIN) 800 MG tablet Take 1 tablet (800 mg total) by mouth every 8 (eight) hours as needed. 30 tablet 0   No current facility-administered medications for this visit.    Review of Systems Review of Systems Constitutional: negative for fatigue and weight loss Respiratory: negative for cough and wheezing Cardiovascular: negative for chest pain, fatigue and palpitations Gastrointestinal: negative for abdominal pain and change in bowel habits Genitourinary:negative Integument/breast: negative for nipple discharge Musculoskeletal:negative for myalgias Neurological: negative for gait problems and tremors Behavioral/Psych: negative for abusive relationship,  depression Endocrine: negative for temperature intolerance     Blood pressure 114/78, pulse 82, temperature 97.7 F (36.5 C), height 5\' 2"  (1.575 m), weight 100.699 kg (222 lb), last menstrual period 04/06/2014.  Physical Exam Physical Exam General:   alert  Skin:   no rash or abnormalities  Lungs:   clear to auscultation bilaterally  Heart:   regular rate and rhythm, S1, S2 normal, no murmur, click, rub or gallop  Breasts:   normal without suspicious masses, skin or nipple changes or axillary nodes  Abdomen:  normal findings: no organomegaly, soft, non-tender and no hernia  Pelvis:  External genitalia: normal general appearance Urinary system: urethral meatus normal and bladder without fullness, nontender Vaginal: normal without tenderness, induration or masses Cervix: normal appearance Adnexa: normal bimanual exam, hard to palpate due to body habitus Uterus: anteverted tender, slightly enlarged size    75% of 30 min visit spent on counseling and coordination of care.   Data Reviewed [facility] hx.    Assessment     Healthy female exam AUB- most likely nonstructural cause     Plan    Orders Placed This Encounter  Procedures  . US Transvaginal Non-OB    Standing Status: Future     Number of Occurrences:      Standing Expiration Date: 08/04/2015    Order Specific Question:  Reason for Exam (SYMPTOM  OR DIAGNOSIS REQUIRED)    Answer:  AUB    Order Specific Question:  Preferred imaging location?    Answer:  Internal  . CBC with Differential/Platelet  .  TSH  . Prolactin  . APTT  . Fibrinogen   Meds ordered this encounter  Medications  . ibuprofen (ADVIL,MOTRIN) 800 MG tablet    Sig: Take 1 tablet (800 mg total) by mouth every 8 (eight) hours as needed.    Dispense:  30 tablet    Refill:  0    Need to obtain previous records Possible management options include: hormonal therapy, patient declined any hormonal treatment, she desires surgical intervention.   Follow up one month with Dr. Clearance Coots per patient request.

## 2014-06-05 NOTE — Addendum Note (Signed)
Addended by: Odessa FlemingBOHNE, Ricarda Atayde M on: 06/05/2014 03:15 PM   Modules accepted: Orders

## 2014-06-06 LAB — PROLACTIN: Prolactin: 6.3 ng/mL

## 2014-06-09 LAB — SURESWAB, VAGINOSIS/VAGINITIS PLUS
Atopobium vaginae: NOT DETECTED Log (cells/mL)
C. albicans, DNA: NOT DETECTED
C. glabrata, DNA: NOT DETECTED
C. parapsilosis, DNA: NOT DETECTED
C. trachomatis RNA, TMA: NOT DETECTED
C. tropicalis, DNA: NOT DETECTED
GARDNERELLA VAGINALIS: 5.3 Log (cells/mL)
LACTOBACILLUS SPECIES: NOT DETECTED Log (cells/mL)
MEGASPHAERA SPECIES: NOT DETECTED Log (cells/mL)
N. gonorrhoeae RNA, TMA: NOT DETECTED
T. VAGINALIS RNA, QL TMA: NOT DETECTED

## 2014-06-12 ENCOUNTER — Other Ambulatory Visit: Payer: Self-pay | Admitting: Certified Nurse Midwife

## 2014-06-12 DIAGNOSIS — N939 Abnormal uterine and vaginal bleeding, unspecified: Secondary | ICD-10-CM

## 2014-06-17 ENCOUNTER — Ambulatory Visit (INDEPENDENT_AMBULATORY_CARE_PROVIDER_SITE_OTHER): Payer: Medicaid Other

## 2014-06-17 ENCOUNTER — Other Ambulatory Visit: Payer: Medicaid Other

## 2014-06-17 DIAGNOSIS — N939 Abnormal uterine and vaginal bleeding, unspecified: Secondary | ICD-10-CM

## 2014-06-18 ENCOUNTER — Ambulatory Visit: Payer: Medicaid Other | Admitting: Obstetrics

## 2014-06-22 ENCOUNTER — Other Ambulatory Visit: Payer: Medicaid Other

## 2014-06-22 DIAGNOSIS — N939 Abnormal uterine and vaginal bleeding, unspecified: Secondary | ICD-10-CM

## 2014-06-22 LAB — T4: T4 TOTAL: 7.1 ug/dL (ref 4.5–12.0)

## 2014-06-22 LAB — T3 UPTAKE: T3 UPTAKE: 25 % (ref 22–35)

## 2014-06-22 LAB — FREE THYROXINE INDEX: Free Thyroxine Index: 1.8 (ref 1.4–3.8)

## 2014-06-24 ENCOUNTER — Ambulatory Visit: Payer: Medicaid Other | Admitting: Certified Nurse Midwife

## 2014-06-26 ENCOUNTER — Ambulatory Visit: Payer: Medicaid Other | Admitting: Obstetrics

## 2014-07-18 ENCOUNTER — Encounter (HOSPITAL_COMMUNITY): Payer: Self-pay | Admitting: *Deleted

## 2014-07-18 ENCOUNTER — Inpatient Hospital Stay (HOSPITAL_COMMUNITY)
Admission: AD | Admit: 2014-07-18 | Discharge: 2014-07-18 | Disposition: A | Discharge status: Home / Self Care | Payer: Medicaid Other | Source: Ambulatory Visit | Attending: Obstetrics | Admitting: Obstetrics

## 2014-07-18 DIAGNOSIS — N939 Abnormal uterine and vaginal bleeding, unspecified: Secondary | ICD-10-CM | POA: Insufficient documentation

## 2014-07-18 LAB — URINALYSIS, ROUTINE W REFLEX MICROSCOPIC
Bilirubin Urine: NEGATIVE
GLUCOSE, UA: NEGATIVE mg/dL
Ketones, ur: NEGATIVE mg/dL
Leukocytes, UA: NEGATIVE
Nitrite: NEGATIVE
Protein, ur: NEGATIVE mg/dL
Urobilinogen, UA: 0.2 mg/dL (ref 0.0–1.0)
pH: 5.5 (ref 5.0–8.0)

## 2014-07-18 LAB — WET PREP, GENITAL
Clue Cells Wet Prep HPF POC: NONE SEEN
Trich, Wet Prep: NONE SEEN
YEAST WET PREP: NONE SEEN

## 2014-07-18 LAB — CBC
HEMATOCRIT: 36.1 % (ref 36.0–46.0)
Hemoglobin: 11.8 g/dL — ABNORMAL LOW (ref 12.0–15.0)
MCH: 22.6 pg — ABNORMAL LOW (ref 26.0–34.0)
MCHC: 32.7 g/dL (ref 30.0–36.0)
MCV: 69.3 fL — ABNORMAL LOW (ref 78.0–100.0)
PLATELETS: 283 10*3/uL (ref 150–400)
RBC: 5.21 MIL/uL — AB (ref 3.87–5.11)
RDW: 14.4 % (ref 11.5–15.5)
WBC: 12.2 10*3/uL — ABNORMAL HIGH (ref 4.0–10.5)

## 2014-07-18 LAB — URINE MICROSCOPIC-ADD ON

## 2014-07-18 LAB — POCT PREGNANCY, URINE: Preg Test, Ur: NEGATIVE

## 2014-07-18 MED ORDER — KETOROLAC TROMETHAMINE 60 MG/2ML IM SOLN
60.0000 mg | Freq: Once | INTRAMUSCULAR | Status: AC
  Administered 2014-07-18: 60 mg via INTRAMUSCULAR
  Filled 2014-07-18: qty 2

## 2014-07-18 MED ORDER — MEDROXYPROGESTERONE ACETATE 10 MG PO TABS: 10.0000 mg | ORAL_TABLET | Freq: Every day | ORAL | Status: DC

## 2014-07-18 MED ORDER — IBUPROFEN 800 MG PO TABS: 800.0000 mg | ORAL_TABLET | Freq: Three times a day (TID) | ORAL | Status: DC | PRN

## 2014-07-18 NOTE — Progress Notes (Signed)
Erin MouseNatalie Wilkinson CNM in earlier to discuss test results and d/c plan. WRitten and verbal d/c instructions given and understanding voiced

## 2014-07-18 NOTE — Discharge Instructions (Signed)
Abnormal Uterine Bleeding Abnormal uterine bleeding can affect women at various stages in life, including teenagers, women in their reproductive years, pregnant women, and women who have reached menopause. Several kinds of uterine bleeding are considered abnormal, including:  Bleeding or spotting between periods.   Bleeding after sexual intercourse.   Bleeding that is heavier or more than normal.   Periods that last longer than usual.  Bleeding after menopause.  Many cases of abnormal uterine bleeding are minor and simple to treat, while others are more serious. Any type of abnormal bleeding should be evaluated by your health care provider. Treatment will depend on the cause of the bleeding. HOME CARE INSTRUCTIONS Monitor your condition for any changes. The following actions may help to alleviate any discomfort you are experiencing:  Avoid the use of tampons and douches as directed by your health care provider.  Change your pads frequently. You should get regular pelvic exams and Pap tests. Keep all follow-up appointments for diagnostic tests as directed by your health care provider.  SEEK MEDICAL CARE IF:   Your bleeding lasts more than 1 week.   You feel dizzy at times.  SEEK IMMEDIATE MEDICAL CARE IF:   You pass out.   You are changing pads every 15 to 30 minutes.   You have abdominal pain.  You have a fever.   You become sweaty or weak.   You are passing large blood clots from the vagina.   You start to feel nauseous and vomit. MAKE SURE YOU:   Understand these instructions.  Will watch your condition.  Will get help right away if you are not doing well or get worse. Document Released: 03/27/2005 Document Revised: 04/01/2013 Document Reviewed: 10/24/2012 Exit[employer] Patient Information 2015 [employer]. This information is not intended to replace advice given to you by your health care provider. Make sure you discuss any questions you have with your  health care provider.  

## 2014-07-18 NOTE — MAU Note (Signed)
Not in lobby when called 

## 2014-07-18 NOTE — Progress Notes (Signed)
"  Sore" pain is constant and "shocking" pain comes and goes

## 2014-07-18 NOTE — MAU Note (Signed)
LMP 12/25 and stopped March 30. Stopped and started back yesterday. Now is "like running water". Some vaginal pain that is "shocking" like sometimes and sometimes just "sore"

## 2014-07-18 NOTE — MAU Provider Note (Signed)
History     CSN: [id number]  Arrival date and time: 07/18/14 1845   First Provider Initiated Contact with Patient 07/18/14 2138      Chief Complaint  Patient presents with  . Vaginal Bleeding  . Vaginal Pain   HPI 19 y.o. G1P1001 with vaginal bleeding x 97 days, onset of intermittent pain today, has not taken any medication for pain relief. States bleeding has been "heavy". Pt was seen in Dr. Verdell Carmine office  2/26, had normal labs, normal u/s, pt declined hormonal intervention for bleeding at that point, states she wants surgery, and was prescribed motrin which she did not get filled. She had a follow up appointment scheduled w/ Dr. Clearance Coots to further discuss management options, pt chose not to keep that appointment "because they couldn't tell me what was going on".   Past Medical History  Diagnosis Date  . History of chronic bronchitis   . Asthma   . Headache(784.0)   . History of depression   . Gestational diabetes mellitus in pregnancy     new diagnosis 01/2011    Past Surgical History  Procedure Laterality Date  . No past surgeries      Family History  Problem Relation Age of Onset  . Asthma Mother   . Depression Mother   . Miscarriages / [place] Mother   . Cancer Maternal Uncle   . Diabetes Maternal Grandmother   . Hypertension Maternal Grandmother   . Arthritis Paternal Grandmother   . Stroke Paternal Grandmother     History  Substance Use Topics  . Smoking status: Never Smoker   . Smokeless tobacco: Never Used  . Alcohol Use: No    Allergies: No Known Allergies  Prescriptions prior to admission  Medication Sig Dispense Refill Last Dose  . [DISCONTINUED] ibuprofen (ADVIL,MOTRIN) 800 MG tablet Take 1 tablet (800 mg total) by mouth every 8 (eight) hours as needed. (Patient not taking: Reported on 07/18/2014) 30 tablet 0     Review of Systems  Constitutional: Negative.   Respiratory: Negative.   Cardiovascular: Negative.   Gastrointestinal: Positive  for abdominal pain. Negative for nausea, vomiting, diarrhea and constipation.  Genitourinary: Negative for dysuria, urgency, frequency, hematuria and flank pain.       + bleeding   Musculoskeletal: Negative.   Neurological: Negative.   Psychiatric/Behavioral: Negative.    Physical Exam   Blood pressure 114/71, pulse 85, temperature 98.7 F (37.1 C), resp. rate 18, height 5' 1.5" (1.562 m), weight 215 lb 3.2 oz (97.614 kg), last menstrual period 04/03/2014.  Physical Exam  Nursing note and vitals reviewed. Constitutional: She is oriented to person, place, and time. She appears well-developed and well-nourished. No distress.  Obese   Cardiovascular: Normal rate.   Respiratory: Effort normal.  GI: Soft. There is no tenderness.  Genitourinary: There is no rash, tenderness or lesion on the right labia. There is no rash, tenderness or lesion on the left labia. Uterus is tender. Uterus is not enlarged. Cervix exhibits no discharge. Right adnexum displays tenderness. Right adnexum displays no mass and no fullness. Left adnexum displays tenderness. Left adnexum displays no mass and no fullness. There is bleeding (moderate) in the vagina. No erythema or tenderness in the vagina. No vaginal discharge found.  Moderate diffuse tenderness  Musculoskeletal: Normal range of motion.  Neurological: She is alert and oriented to person, place, and time.  Skin: Skin is warm and dry.  Psychiatric: She has a normal mood and affect.    MAU Course  Procedures  Results for orders placed or performed during the hospital encounter of 07/18/14 (from the past 24 hour(s))  Urinalysis, Routine w reflex microscopic     Status: Abnormal   Collection Time: 07/18/14  8:00 PM  Result Value Ref Range   Color, Urine YELLOW YELLOW   APPearance CLOUDY (A) CLEAR   Specific Gravity, Urine >1.030 (H) 1.005 - 1.030   pH 5.5 5.0 - 8.0   Glucose, UA NEGATIVE NEGATIVE mg/dL   Hgb urine dipstick LARGE (A) NEGATIVE    Bilirubin Urine NEGATIVE NEGATIVE   Ketones, ur NEGATIVE NEGATIVE mg/dL   Protein, ur NEGATIVE NEGATIVE mg/dL   Urobilinogen, UA 0.2 0.0 - 1.0 mg/dL   Nitrite NEGATIVE NEGATIVE   Leukocytes, UA NEGATIVE NEGATIVE  Urine microscopic-add on     Status: None   Collection Time: 07/18/14  8:00 PM  Result Value Ref Range   Squamous Epithelial / LPF RARE RARE   WBC, UA 0-2 <3 WBC/hpf   RBC / HPF 21-50 <3 RBC/hpf   Urine-Other AMORPHOUS URATES/PHOSPHATES   Pregnancy, urine POC     Status: None   Collection Time: 07/18/14  8:08 PM  Result Value Ref Range   Preg Test, Ur NEGATIVE NEGATIVE  CBC     Status: Abnormal   Collection Time: 07/18/14  8:11 PM  Result Value Ref Range   WBC 12.2 (H) 4.0 - 10.5 K/uL   RBC 5.21 (H) 3.87 - 5.11 MIL/uL   Hemoglobin 11.8 (L) 12.0 - 15.0 g/dL   HCT 40.936.1 81.136.0 - 91.446.0 %   MCV 69.3 (L) 78.0 - 100.0 fL   MCH 22.6 (L) 26.0 - 34.0 pg   MCHC 32.7 30.0 - 36.0 g/dL   RDW 78.214.4 95.611.5 - 21.315.5 %   Platelets 283 150 - 400 K/uL  Wet prep, genital     Status: Abnormal   Collection Time: 07/18/14  9:40 PM  Result Value Ref Range   Yeast Wet Prep HPF POC NONE SEEN NONE SEEN   Trich, Wet Prep NONE SEEN NONE SEEN   Clue Cells Wet Prep HPF POC NONE SEEN NONE SEEN   WBC, Wet Prep HPF POC FEW (A) NONE SEEN    Toradol for pain in MAU  GC/CT pending Assessment and Plan   1. Abnormal uterine bleeding (AUB)   Recent normal u/s, exam WNL except for nonspecific tenderness in pelvis, Hgb 11.8. Provera for bleeding, Motrin for pain, stressed importance of outpatient follow up for ongoing management and eval of bleeding, pt to call Dr. Verdell CarmineHarper's office to schedule appointment.     Medication List    TAKE these medications        ibuprofen 800 MG tablet  Commonly known as:  ADVIL,MOTRIN  Take 1 tablet (800 mg total) by mouth every 8 (eight) hours as needed.     medroxyPROGESTERone 10 MG tablet  Commonly known as:  PROVERA  Take 1 tablet (10 mg total) by mouth daily.             Follow-up Information    Follow up with HARPER,CHARLES A, MD.   Specialty:  Obstetrics and Gynecology   Contact information:   [address] [place] [address] [phone]         [facility],NATALIE 07/18/2014, 10:44 PM

## 2014-07-19 ENCOUNTER — Other Ambulatory Visit: Payer: Self-pay | Admitting: Obstetrics

## 2014-07-19 DIAGNOSIS — N939 Abnormal uterine and vaginal bleeding, unspecified: Secondary | ICD-10-CM

## 2014-07-20 LAB — GC/CHLAMYDIA PROBE AMP (~~LOC~~) NOT AT ARMC
Chlamydia: NEGATIVE
Neisseria Gonorrhea: NEGATIVE

## 2014-07-21 LAB — HIV ANTIBODY (ROUTINE TESTING W REFLEX): HIV Screen 4th Generation wRfx: NONREACTIVE

## 2014-08-23 ENCOUNTER — Inpatient Hospital Stay (HOSPITAL_COMMUNITY)
Admission: AD | Admit: 2014-08-23 | Discharge: 2014-08-23 | Disposition: A | Discharge status: Home / Self Care | Payer: Medicaid Other | Source: Ambulatory Visit | Attending: Obstetrics and Gynecology | Admitting: Obstetrics and Gynecology

## 2014-08-23 DIAGNOSIS — N3000 Acute cystitis without hematuria: Secondary | ICD-10-CM

## 2014-08-23 DIAGNOSIS — N390 Urinary tract infection, site not specified: Secondary | ICD-10-CM | POA: Diagnosis not present

## 2014-08-23 DIAGNOSIS — R300 Dysuria: Secondary | ICD-10-CM | POA: Diagnosis present

## 2014-08-23 DIAGNOSIS — Z3202 Encounter for pregnancy test, result negative: Secondary | ICD-10-CM | POA: Diagnosis not present

## 2014-08-23 LAB — URINALYSIS, ROUTINE W REFLEX MICROSCOPIC
BILIRUBIN URINE: NEGATIVE
Glucose, UA: NEGATIVE mg/dL
KETONES UR: NEGATIVE mg/dL
Nitrite: POSITIVE — AB
PH: 5.5 (ref 5.0–8.0)
PROTEIN: 30 mg/dL — AB
Urobilinogen, UA: 0.2 mg/dL (ref 0.0–1.0)

## 2014-08-23 LAB — URINE MICROSCOPIC-ADD ON

## 2014-08-23 LAB — POCT PREGNANCY, URINE: Preg Test, Ur: NEGATIVE

## 2014-08-23 MED ORDER — PHENAZOPYRIDINE HCL 200 MG PO TABS: 200.0000 mg | ORAL_TABLET | Freq: Three times a day (TID) | ORAL | Status: DC

## 2014-08-23 MED ORDER — CIPROFLOXACIN HCL 500 MG PO TABS: 500.0000 mg | ORAL_TABLET | Freq: Two times a day (BID) | ORAL | Status: DC

## 2014-08-23 NOTE — Discharge Instructions (Signed)

## 2014-08-23 NOTE — MAU Provider Note (Signed)
History     CSN: [id number]  Arrival date and time: 08/23/14 1207   None     Chief Complaint  Patient presents with  . Urinary Frequency   HPI [name] is a 19 y.o. G1P1001 who presents to MAU today with complaint of dysuria x 2 days, worse since yesterday. The patient also states mild pelvic pain with urination only. She denies fever, N/V, vaginal discharge, bleeding or flank pain. She states occasional mild hematuria noted.   OB History    Gravida Para Term Preterm AB TAB SAB Ectopic Multiple Living   1 1 1       1       Past Medical History  Diagnosis Date  . History of chronic bronchitis   . Asthma   . Headache(784.0)   . History of depression   . Gestational diabetes mellitus in pregnancy     new diagnosis 01/2011    Past Surgical History  Procedure Laterality Date  . No past surgeries      Family History  Problem Relation Age of Onset  . Asthma Mother   . Depression Mother   . Miscarriages / [place] Mother   . Cancer Maternal Uncle   . Diabetes Maternal Grandmother   . Hypertension Maternal Grandmother   . Arthritis Paternal Grandmother   . Stroke Paternal Grandmother     History  Substance Use Topics  . Smoking status: Never Smoker   . Smokeless tobacco: Never Used  . Alcohol Use: No    Allergies: No Known Allergies  No prescriptions prior to admission    Review of Systems  Constitutional: Negative for fever and malaise/fatigue.  Gastrointestinal: Negative for nausea, vomiting, abdominal pain, diarrhea and constipation.  Genitourinary: Positive for dysuria and hematuria. Negative for urgency, frequency and flank pain.       Neg - vaginal bleeding, discharge   Physical Exam   Blood pressure 130/72, pulse 72, temperature 97.2 F (36.2 C), resp. rate 18, height 5\' 1"  (1.549 m), weight 221 lb (100.245 kg).  Physical Exam  Nursing note and vitals reviewed. Constitutional: She is oriented to person, place, and time. She  appears well-developed and well-nourished. No distress.  HENT:  Head: Normocephalic and atraumatic.  Cardiovascular: Normal rate.   Respiratory: Effort normal.  GI: Soft. She exhibits no distension and no mass. There is no tenderness. There is no rebound, no guarding and no CVA tenderness.  Neurological: She is alert and oriented to person, place, and time.  Skin: Skin is warm and dry. No erythema.  Psychiatric: She has a normal mood and affect.   Results for orders placed or performed during the hospital encounter of 08/23/14 (from the past 24 hour(s))  Urinalysis, Routine w reflex microscopic     Status: Abnormal   Collection Time: 08/23/14 12:28 PM  Result Value Ref Range   Color, Urine YELLOW YELLOW   APPearance HAZY (A) CLEAR   Specific Gravity, Urine >1.030 (H) 1.005 - 1.030   pH 5.5 5.0 - 8.0   Glucose, UA NEGATIVE NEGATIVE mg/dL   Hgb urine dipstick LARGE (A) NEGATIVE   Bilirubin Urine NEGATIVE NEGATIVE   Ketones, ur NEGATIVE NEGATIVE mg/dL   Protein, ur 30 (A) NEGATIVE mg/dL   Urobilinogen, UA 0.2 0.0 - 1.0 mg/dL   Nitrite POSITIVE (A) NEGATIVE   Leukocytes, UA SMALL (A) NEGATIVE  Urine microscopic-add on     Status: Abnormal   Collection Time: 08/23/14 12:28 PM  Result Value Ref Range  Squamous Epithelial / LPF FEW (A) RARE   WBC, UA 7-10 <3 WBC/hpf   RBC / HPF 0-2 <3 RBC/hpf   Bacteria, UA MANY (A) RARE  Pregnancy, urine POC     Status: None   Collection Time: 08/23/14 12:33 PM  Result Value Ref Range   Preg Test, Ur NEGATIVE NEGATIVE    MAU Course  Procedures None  MDM UPT - negative UA today consistent with UTI Urine culture pending GC/Chlamydia from urine sample ordered  Assessment and Plan  A: UTI  P: Discharge home Rx for Cipro and Pyridium given to patient Warning signs for pyelonephritis discussed Patient advised to follow-up with PCP as needed or if symptoms persist or worsen Patient may return to MAU as needed or if her condition were to  change or worsen   Marny LowensteinJulie N Chelbi Herber, PA-C  08/23/2014, 1:36 PM

## 2014-08-23 NOTE — MAU Note (Signed)
Pt presents to MAU with complaints of urine frequency and pain with urination

## 2014-08-24 LAB — URINE CULTURE: Colony Count: >=100000

## 2015-07-25 ENCOUNTER — Inpatient Hospital Stay (HOSPITAL_COMMUNITY)
Admission: AD | Admit: 2015-07-25 | Discharge: 2015-07-25 | Disposition: A | Discharge status: Home / Self Care | Payer: Medicaid Other | Source: Ambulatory Visit | Attending: Family Medicine | Admitting: Family Medicine

## 2015-07-25 ENCOUNTER — Encounter (HOSPITAL_COMMUNITY): Payer: Self-pay | Admitting: *Deleted

## 2015-07-25 DIAGNOSIS — N760 Acute vaginitis: Secondary | ICD-10-CM | POA: Insufficient documentation

## 2015-07-25 DIAGNOSIS — J45909 Unspecified asthma, uncomplicated: Secondary | ICD-10-CM | POA: Diagnosis not present

## 2015-07-25 DIAGNOSIS — A499 Bacterial infection, unspecified: Secondary | ICD-10-CM | POA: Diagnosis not present

## 2015-07-25 DIAGNOSIS — B9689 Other specified bacterial agents as the cause of diseases classified elsewhere: Secondary | ICD-10-CM

## 2015-07-25 LAB — WET PREP, GENITAL
Sperm: NONE SEEN
Trich, Wet Prep: NONE SEEN
Yeast Wet Prep HPF POC: NONE SEEN

## 2015-07-25 LAB — URINALYSIS, ROUTINE W REFLEX MICROSCOPIC
BILIRUBIN URINE: NEGATIVE
Glucose, UA: NEGATIVE mg/dL
KETONES UR: 15 mg/dL — AB
Nitrite: NEGATIVE
PROTEIN: NEGATIVE mg/dL
Specific Gravity, Urine: <1.005 — ABNORMAL LOW (ref 1.005–1.030)
pH: 5.5 (ref 5.0–8.0)

## 2015-07-25 LAB — URINE MICROSCOPIC-ADD ON

## 2015-07-25 LAB — POCT PREGNANCY, URINE: PREG TEST UR: NEGATIVE

## 2015-07-25 MED ORDER — FLUCONAZOLE 150 MG PO TABS: 150.0000 mg | ORAL_TABLET | Freq: Every day | ORAL | Status: DC

## 2015-07-25 MED ORDER — METRONIDAZOLE 500 MG PO TABS: 500.0000 mg | ORAL_TABLET | Freq: Two times a day (BID) | ORAL | Status: DC

## 2015-07-25 NOTE — MAU Provider Note (Signed)
History     CSN: [id number]  Arrival date and time: 07/25/15 [id number]   First Provider Initiated Contact with Patient 07/25/15 0242      Chief Complaint  Patient presents with  . Dysuria   HPI   Ms.Erin Wilkinson is a 20 y.o. female G1P1001 presenting to MAU with vaginal odor and vaginal itching. Symptoms have been present for a few days; "it is very uncomfortable".  She tried vagicare over the counter which has not helped completely, "it stopped the itching for about 30 minutes and then returned."  OB History    Gravida Para Term Preterm AB TAB SAB Ectopic Multiple Living   1 1 1       1       Past Medical History  Diagnosis Date  . History of chronic bronchitis   . Asthma   . Headache(784.0)   . History of depression   . Gestational diabetes mellitus in pregnancy     new diagnosis 01/2011    Past Surgical History  Procedure Laterality Date  . No past surgeries      Family History  Problem Relation Age of Onset  . Asthma Mother   . Depression Mother   . Miscarriages / [place] Mother   . Cancer Maternal Uncle   . Diabetes Maternal Grandmother   . Hypertension Maternal Grandmother   . Arthritis Paternal Grandmother   . Stroke Paternal Grandmother     Social History  Substance Use Topics  . Smoking status: Never Smoker   . Smokeless tobacco: Never Used  . Alcohol Use: No    Allergies: No Known Allergies  Prescriptions prior to admission  Medication Sig Dispense Refill Last Dose  . ciprofloxacin (CIPRO) 500 MG tablet Take 1 tablet (500 mg total) by mouth 2 (two) times daily. 10 tablet 0   . ibuprofen (ADVIL,MOTRIN) 800 MG tablet Take 1 tablet (800 mg total) by mouth every 8 (eight) hours as needed. 30 tablet 0   . medroxyPROGESTERone (PROVERA) 10 MG tablet Take 1 tablet (10 mg total) by mouth daily. 30 tablet 0   . phenazopyridine (PYRIDIUM) 200 MG tablet Take 1 tablet (200 mg total) by mouth 3 (three) times daily. 6 tablet 0    Results for orders  placed or performed during the hospital encounter of 07/25/15 (from the past 48 hour(s))  Urinalysis, Routine w reflex microscopic ([facility])     Status: Abnormal   Collection Time: 07/25/15  2:15 AM  Result Value Ref Range   Color, Urine YELLOW YELLOW   APPearance CLEAR CLEAR   Specific Gravity, Urine <1.005 (L) 1.005 - 1.030   pH 5.5 5.0 - 8.0   Glucose, UA NEGATIVE NEGATIVE mg/dL   Hgb urine dipstick LARGE (A) NEGATIVE   Bilirubin Urine NEGATIVE NEGATIVE   Ketones, ur 15 (A) NEGATIVE mg/dL   Protein, ur NEGATIVE NEGATIVE mg/dL   Nitrite NEGATIVE NEGATIVE   Leukocytes, UA SMALL (A) NEGATIVE  Urine microscopic-add on     Status: Abnormal   Collection Time: 07/25/15  2:15 AM  Result Value Ref Range   Squamous Epithelial / LPF 0-5 (A) NONE SEEN   WBC, UA 0-5 0 - 5 WBC/hpf   RBC / HPF 6-30 0 - 5 RBC/hpf   Bacteria, UA FEW (A) NONE SEEN  Pregnancy, urine POC     Status: None   Collection Time: 07/25/15  2:27 AM  Result Value Ref Range   Preg Test, Ur NEGATIVE NEGATIVE    Comment:  THE SENSITIVITY OF THIS METHODOLOGY IS >24 mIU/mL   Wet prep, genital     Status: Abnormal   Collection Time: 07/25/15  2:44 AM  Result Value Ref Range   Yeast Wet Prep HPF POC NONE SEEN NONE SEEN   Trich, Wet Prep NONE SEEN NONE SEEN   Clue Cells Wet Prep HPF POC PRESENT (A) NONE SEEN   WBC, Wet Prep HPF POC MODERATE (A) NONE SEEN    Comment: MODERATE BACTERIA SEEN   Sperm NONE SEEN     Review of Systems  Constitutional: Negative for fever and chills.  Gastrointestinal: Negative for nausea, vomiting and abdominal pain.  Genitourinary: Negative for dysuria, urgency, frequency and hematuria.   Physical Exam   Blood pressure 110/61, pulse 73, temperature 98.1 F (36.7 C), resp. rate 18, height  (1.575 m), weight 170 lb 9.6 oz (77.384 kg), last menstrual period 07/20/2015.  Physical Exam  Constitutional: She is oriented to person, place, and time. She appears well-developed and  well-nourished. No distress.  HENT:  Head: Normocephalic.  Respiratory: Effort normal.  Genitourinary:  Wet prep and GC collected without speculum.   Musculoskeletal: Normal range of motion.  Neurological: She is alert and oriented to person, place, and time.  Skin: Skin is warm. She is not diaphoretic.  Psychiatric: Her behavior is normal.    MAU Course  Procedures  None  MDM  Wet prep and GC collected   Assessment and Plan   A:  1. BV (bacterial vaginosis)   2. Vaginitis and vulvovaginitis     P:  Discharge home in stable condition RX: Flagyl, diflucan Return to MAU for emergencies only Follow up with GYN, HD info given to the patient.   Duane Lope, NP 07/25/2015 2:46 AM

## 2015-07-25 NOTE — MAU Note (Addendum)
Having a lot of itching and irritation vaginally and around clitoris. Some white/yellow vaginal d/c. Some odor to d/c. Symptoms present for a wk but worse last few days.

## 2015-07-25 NOTE — Discharge Instructions (Signed)

## 2015-07-27 LAB — GC/CHLAMYDIA PROBE AMP (~~LOC~~) NOT AT ARMC
Chlamydia: NEGATIVE
Neisseria Gonorrhea: NEGATIVE

## 2015-09-05 ENCOUNTER — Encounter (HOSPITAL_COMMUNITY): Payer: Self-pay | Admitting: *Deleted

## 2015-09-05 ENCOUNTER — Ambulatory Visit (HOSPITAL_COMMUNITY)
Admission: EM | Admit: 2015-09-05 | Discharge: 2015-09-05 | Disposition: A | Discharge status: Home / Self Care | Payer: Medicaid Other | Attending: Family Medicine | Admitting: Family Medicine

## 2015-09-05 DIAGNOSIS — L509 Urticaria, unspecified: Secondary | ICD-10-CM

## 2015-09-05 MED ORDER — PREDNISONE 20 MG PO TABS
60.0000 mg | ORAL_TABLET | Freq: Once | ORAL | Status: AC
  Administered 2015-09-05: 60 mg via ORAL

## 2015-09-05 MED ORDER — PREDNISONE 20 MG PO TABS: 40.0000 mg | ORAL_TABLET | Freq: Every day | ORAL | Status: AC

## 2015-09-05 MED ORDER — FAMOTIDINE 20 MG PO TABS
20.0000 mg | ORAL_TABLET | Freq: Once | ORAL | Status: AC
  Administered 2015-09-05: 20 mg via ORAL

## 2015-09-05 MED ORDER — DIPHENHYDRAMINE HCL 50 MG/ML IJ SOLN
50.0000 mg | Freq: Once | INTRAMUSCULAR | Status: AC
  Administered 2015-09-05: 50 mg via INTRAMUSCULAR

## 2015-09-05 MED ORDER — PREDNISONE 20 MG PO TABS
ORAL_TABLET | ORAL | Status: AC
  Filled 2015-09-05: qty 3

## 2015-09-05 MED ORDER — DIPHENHYDRAMINE HCL 50 MG/ML IJ SOLN
INTRAMUSCULAR | Status: AC
  Filled 2015-09-05: qty 1

## 2015-09-05 MED ORDER — FAMOTIDINE 20 MG PO TABS
ORAL_TABLET | ORAL | Status: AC
  Filled 2015-09-05: qty 1

## 2015-09-05 NOTE — ED Provider Notes (Signed)
CSN: [id number]     Arrival date & time 09/05/15  1406 History   First MD Initiated Contact with Patient 09/05/15 1501     Chief Complaint  Patient presents with  . Pruritis    Patient is a 20 y.o. female presenting with rash. The history is provided by the patient.  Rash Location:  Full body Severity:  Moderate Onset quality:  Sudden Duration:  2 days Timing:  Constant Progression:  Unchanged Chronicity:  New Context: new detergent/soap   Context: not animal contact, not chemical exposure, not eggs, not exposure to similar rash, not insect bite/sting, not plant contact, not pollen, not sick contacts and not sun exposure   Relieved by:  Nothing Pt reports onset of pruritic rash Friday night that has been unrelieved by OTC meds. No known allergens. Exposure recently to a new clothes detergent.   Past Medical History  Diagnosis Date  . History of chronic bronchitis   . Asthma   . Headache(784.0)   . History of depression   . Gestational diabetes mellitus in pregnancy     new diagnosis 01/2011   Past Surgical History  Procedure Laterality Date  . No past surgeries     Family History  Problem Relation Age of Onset  . Asthma Mother   . Depression Mother   . Miscarriages / [place] Mother   . Cancer Maternal Uncle   . Diabetes Maternal Grandmother   . Hypertension Maternal Grandmother   . Arthritis Paternal Grandmother   . Stroke Paternal Grandmother    Social History  Substance Use Topics  . Smoking status: Never Smoker   . Smokeless tobacco: Never Used  . Alcohol Use: No   OB History    Gravida Para Term Preterm AB TAB SAB Ectopic Multiple Living   1 1 1       1      Review of Systems  Skin: Positive for rash.  All other systems reviewed and are negative.   Allergies  Review of patient's allergies indicates no known allergies.  Home Medications   Prior to Admission medications   Medication Sig Start Date End Date Taking? Authorizing Provider   fluconazole (DIFLUCAN) 150 MG tablet Take 1 tablet (150 mg total) by mouth daily. Take one tablet today and repeat dose in three days. 07/25/15   Duane LopeJennifer I Rasch, NP  ibuprofen (ADVIL,MOTRIN) 800 MG tablet Take 1 tablet (800 mg total) by mouth every 8 (eight) hours as needed. 07/18/14   Archie PattenNatalie K Frazier, CNM  metroNIDAZOLE (FLAGYL) 500 MG tablet Take 1 tablet (500 mg total) by mouth 2 (two) times daily. 07/25/15   Duane LopeJennifer I Rasch, NP  predniSONE (DELTASONE) 20 MG tablet Take 2 tablets (40 mg total) by mouth daily with breakfast. 09/06/15 09/10/15  Leanne ChangKatherine P Jackalyn Haith, NP   Meds Ordered and Administered this Visit   Medications  predniSONE (DELTASONE) tablet 60 mg (60 mg Oral Given 09/05/15 1615)  diphenhydrAMINE (BENADRYL) injection 50 mg (50 mg Intramuscular Given 09/05/15 1615)  famotidine (PEPCID) tablet 20 mg (20 mg Oral Given 09/05/15 1616)    BP 104/74 mmHg  Pulse 79  Temp(Src) 98.9 F (37.2 C) (Oral)  Resp 16  SpO2 99%  LMP 08/16/2015 No data found.   Physical Exam  Constitutional: She appears well-developed and well-nourished.  HENT:  Head: Normocephalic.  Eyes: Conjunctivae are normal.  Cardiovascular: Normal rate.   Pulmonary/Chest: Effort normal. No stridor.  Neurological: She is alert.  Skin: Skin is warm and dry. Rash noted.  Rash is urticarial.       ED Course  Procedures (including critical care time)  Labs Review Labs Reviewed - No data to display  Imaging Review No results found.   Visual Acuity Review  Right Eye Distance:   Left Eye Distance:   Bilateral Distance:    Right Eye Near:   Left Eye Near:    Bilateral Near:         MDM  1. Urticaria: Will treat w/ prednisone, pepcid and PRN Benadryl w/ derm f/u if needed.    Leanne Chang, NP 09/05/15 1617  Roma Kayser Zealand Boyett, NP 09/05/15 1654

## 2015-09-05 NOTE — ED Notes (Signed)
Pt  Reports  Symptoms  Of      Itching          And  Rash  X  sev  Days    No  New   meds         no  Angioedema      Airway  Intact      Skin is  Warm  And  Dry

## 2015-09-05 NOTE — Discharge Instructions (Signed)
Take Prednisone as directed until completed. Take Benadryl 25-50 mg every 4-6 hours as needed for itching. Take Pepcid 20 mg daily. Follow up with the Dermatology referral provided if symoptoms do not improve.   Allergies An allergy is when your body reacts to a substance in a way that is not normal. An allergic reaction can happen after you:  Eat something.  Breathe in something.  Touch something. WHAT KINDS OF ALLERGIES ARE THERE? You can be allergic to:  Things that are only around during certain seasons, like molds and pollens.  Foods.  Drugs.  Insects.  Animal dander. WHAT ARE SYMPTOMS OF ALLERGIES?  Puffiness (swelling). This may happen on the lips, face, tongue, mouth, or throat.  Sneezing.  Coughing.  Breathing loudly (wheezing).  Stuffy nose.  Tingling in the mouth.  A rash.  Itching.  Itchy, red, puffy areas of skin (hives).  Watery eyes.  Throwing up (vomiting).  Watery poop (diarrhea).  Dizziness.  Feeling faint or fainting.  Trouble breathing or swallowing.  A tight feeling in the chest.  A fast heartbeat. HOW ARE ALLERGIES DIAGNOSED? Allergies can be diagnosed with:  A medical and family history.  Skin tests.  Blood tests.  A food diary. A food diary is a record of all the foods, drinks, and symptoms you have each day.  The results of an elimination diet. This diet involves making sure not to eat certain foods and then seeing what happens when you start eating them again. HOW ARE ALLERGIES TREATED? There is no cure for allergies, but allergic reactions can be treated with medicine. Severe reactions usually need to be treated at a hospital.  HOW CAN REACTIONS BE PREVENTED? The best way to prevent an allergic reaction is to avoid the thing you are allergic to. Allergy shots and medicines can also help prevent reactions in some cases.   This information is not intended to replace advice given to you by your health care provider.  Make sure you discuss any questions you have with your health care provider.   Document Released: 07/22/2012 Document Revised: 04/17/2014 Document Reviewed: 01/06/2014 Elsevier Interactive Patient Education 2016 Elsevier Inc.  Hives Hives are itchy, red, puffy (swollen) areas of the skin. Hives can change in size and location on your body. Hives can come and go for hours, days, or weeks. Hives do not spread from person to person (noncontagious). Scratching, exercise, and stress can make your hives worse. HOME CARE  Avoid things that cause your hives (triggers).  Take antihistamine medicines as told by your doctor. Do not drive while taking an antihistamine.  Take any other medicines for itching as told by your doctor.  Wear loose-fitting clothing.  Keep all doctor visits as told. GET HELP RIGHT AWAY IF:   You have a fever.  Your tongue or lips are puffy.  You have trouble breathing or swallowing.  You feel tightness in the throat or chest.  You have belly (abdominal) pain.  You have lasting or severe itching that is not helped by medicine.  You have painful or puffy joints. These problems may be the first sign of a life-threatening allergic reaction. Call your local emergency services (911 in [place] MAKE SURE YOU:   Understand these instructions.  Will watch your condition.  Will get help right away if you are not doing well or get worse.   This information is not intended to replace advice given to you by your health care provider. Make sure you discuss any questions  you have with your health care provider.   Document Released: 01/04/2008 Document Revised: 09/26/2011 Document Reviewed: 06/20/2011 Elsevier Interactive Patient Education [employer]2016 Elsevier Inc.

## 2015-11-29 ENCOUNTER — Emergency Department (HOSPITAL_COMMUNITY)
Admission: EM | Admit: 2015-11-29 | Discharge: 2015-11-29 | Disposition: A | Discharge status: Home / Self Care | Payer: Medicaid Other | Attending: Physician Assistant | Admitting: Physician Assistant

## 2015-11-29 ENCOUNTER — Encounter (HOSPITAL_COMMUNITY): Payer: Self-pay | Admitting: Emergency Medicine

## 2015-11-29 DIAGNOSIS — N898 Other specified noninflammatory disorders of vagina: Secondary | ICD-10-CM

## 2015-11-29 DIAGNOSIS — R110 Nausea: Secondary | ICD-10-CM | POA: Diagnosis not present

## 2015-11-29 DIAGNOSIS — J45909 Unspecified asthma, uncomplicated: Secondary | ICD-10-CM | POA: Insufficient documentation

## 2015-11-29 DIAGNOSIS — R300 Dysuria: Secondary | ICD-10-CM | POA: Insufficient documentation

## 2015-11-29 DIAGNOSIS — Z7251 High risk heterosexual behavior: Secondary | ICD-10-CM

## 2015-11-29 LAB — WET PREP, GENITAL
Clue Cells Wet Prep HPF POC: NONE SEEN
SPERM: NONE SEEN
TRICH WET PREP: NONE SEEN
WBC, Wet Prep HPF POC: NONE SEEN
YEAST WET PREP: NONE SEEN

## 2015-11-29 LAB — URINALYSIS, ROUTINE W REFLEX MICROSCOPIC
Glucose, UA: NEGATIVE mg/dL
Hgb urine dipstick: NEGATIVE
KETONES UR: NEGATIVE mg/dL
LEUKOCYTES UA: NEGATIVE
NITRITE: NEGATIVE
PH: 5.5 (ref 5.0–8.0)
Protein, ur: NEGATIVE mg/dL
Specific Gravity, Urine: 1.034 — ABNORMAL HIGH (ref 1.005–1.030)

## 2015-11-29 LAB — PREGNANCY, URINE: Preg Test, Ur: NEGATIVE

## 2015-11-29 NOTE — ED Provider Notes (Signed)
[facility]-EMERGENCY DEPT Provider Note   CSN: [id number] Arrival date & time: 11/29/15  1823  By signing my name below, I, Rosario AdieWilliam Andrew Hiatt, attest that this documentation has been prepared under the direction and in the presence of TR[employer] AutomotiveKelly Emric Kowalewski, PA-C.  Electronically Signed: Rosario AdieWilliam Andrew Hiatt, ED Scribe. 11/29/15. 9:10 PM.  History   Chief Complaint Chief Complaint  Patient presents with  . Vaginal Discharge  . Dysuria   The history is provided by the patient. No language interpreter was used.   HPI Comments: Erin Wilkinson is a 20 y.o. female who is G1P1001, with a PMHx of asthma, obesity, and depression, who presents to the Emergency Department complaining of sudden onset, intermittent episodes of milky white vaginal discharge x 5 days, worsening over the past 3 days. Pt report associated nausea, and dysuria secondary to her vaginal discharge. Pt notes that she is currently sexual active with one partner over the past six months. She states that she is concerned for STDs, but is unsure if her partner has been dx'd with an STD. She additionally states that her partner will not tell her if he has had any STDs in the past. Pt notes that she has had an STD previously, with similar vaginal discharge. She further reports that at that time the discharge had a malodorous smell, but denies any malodorous smelling discharge with her current episodes. No PSHx to the abdomen. Denies hematuria, emesis, constipation, diarrhea, bloody stools, fever, or any other associated symptoms.    Past Medical History:  Diagnosis Date  . Asthma   . Gestational diabetes mellitus in pregnancy    new diagnosis 01/2011  . Headache(784.0)   . History of chronic bronchitis   . History of depression    Patient Active Problem List   Diagnosis Date Noted  . Abnormal uterine bleeding (AUB) 06/05/2014  . Obesity (BMI 30-39.9) 08/04/2013  . Contraception management 08/04/2013   Past Surgical History:  Procedure  Laterality Date  . NO PAST SURGERIES     OB History    Gravida Para Term Preterm AB Living   1 1 1     1    SAB TAB Ectopic Multiple Live Births                 Home Medications    Prior to Admission medications   Medication Sig Start Date End Date Taking? Authorizing Provider  fluconazole (DIFLUCAN) 150 MG tablet Take 1 tablet (150 mg total) by mouth daily. Take one tablet today and repeat dose in three days. Patient not taking: Reported on 11/29/2015 07/25/15   Duane LopeJennifer I Rasch, NP  ibuprofen (ADVIL,MOTRIN) 800 MG tablet Take 1 tablet (800 mg total) by mouth every 8 (eight) hours as needed. Patient not taking: Reported on 11/29/2015 07/18/14   Archie PattenNatalie K Frazier, CNM  metroNIDAZOLE (FLAGYL) 500 MG tablet Take 1 tablet (500 mg total) by mouth 2 (two) times daily. Patient not taking: Reported on 11/29/2015 07/25/15   Duane LopeJennifer I Rasch, NP   Family History Family History  Problem Relation Age of Onset  . Asthma Mother   . Depression Mother   . Miscarriages / [place] Mother   . Cancer Maternal Uncle   . Diabetes Maternal Grandmother   . Hypertension Maternal Grandmother   . Arthritis Paternal Grandmother   . Stroke Paternal Grandmother    Social History Social History  Substance Use Topics  . Smoking status: Never Smoker  . Smokeless tobacco: Never Used  . Alcohol use No  Allergies   Review of patient's allergies indicates no known allergies.  Review of Systems Review of Systems  Gastrointestinal: Positive for nausea. Negative for blood in stool, constipation, diarrhea and vomiting.  Genitourinary: Positive for dysuria and vaginal discharge. Negative for hematuria.  A complete 10 system review of systems was obtained and all systems are negative except as noted in the HPI and PMH.    Physical Exam Updated Vital Signs BP 101/63 (BP Location: Left Arm)   Pulse 63   Temp 98.5 F (36.9 C) (Oral)   Resp 17   Ht 5\' 1"  (1.549 m)   Wt 81.6 kg   LMP 10/26/2015   SpO2 100%    BMI 34.01 kg/m   Physical Exam  Constitutional: She is oriented to person, place, and time. She appears well-developed and well-nourished. No distress.  Nontoxic appearing and in NAD  HENT:  Head: Normocephalic and atraumatic.  Eyes: Conjunctivae and EOM are normal. No scleral icterus.  Neck: Normal range of motion.  Cardiovascular: Normal rate, regular rhythm and intact distal pulses.   Pulmonary/Chest: Effort normal. No respiratory distress.  Respirations even and unlabored  Abdominal: Soft. She exhibits no distension. There is no tenderness. There is no guarding.  Soft, nontender abdomen  Genitourinary: There is no rash, tenderness or lesion on the right labia. There is no rash, tenderness or lesion on the left labia. Uterus is not tender. Cervix exhibits no motion tenderness and no friability. Right adnexum displays no mass and no tenderness. Left adnexum displays no mass and no tenderness. Vaginal discharge (scant, thin brown) found.  Musculoskeletal: Normal range of motion.  Neurological: She is alert and oriented to person, place, and time.  Skin: Skin is warm and dry. No rash noted. She is not diaphoretic. No erythema. No pallor.  Psychiatric: She has a normal mood and affect. Her behavior is normal.  Nursing note and vitals reviewed.   ED Treatments / Results  DIAGNOSTIC STUDIES: Oxygen Saturation is 99% on RA, normal by my interpretation.   COORDINATION OF CARE: 9:08 PM-Discussed next steps with pt. Pt verbalized understanding and is agreeable with the plan.   Labs (all labs ordered are listed, but only abnormal results are displayed) Labs Reviewed  URINALYSIS, ROUTINE W REFLEX MICROSCOPIC (NOT AT [facility]) - Abnormal; Notable for the following:       Result Value   Color, Urine AMBER (*)    Specific Gravity, Urine 1.034 (*)    Bilirubin Urine SMALL (*)    All other components within normal limits  WET PREP, GENITAL  PREGNANCY, URINE  GC/CHLAMYDIA PROBE AMP ([facility]) NOT AT [facility]    EKG  EKG Interpretation None       Radiology No results found.  Procedures Procedures (including critical care time)  Medications Ordered in ED Medications - No data to display  Initial Impression / Assessment and Plan / ED Course  I have reviewed the triage vital signs and the nursing notes.  Pertinent labs & imaging results that were available during my care of the patient were reviewed by me and considered in my medical decision making (see chart for details).  Clinical Course    20 year old female presents to the emergency department for evaluation of vaginal discharge. She is afebrile and without vomiting. No cervical motion tenderness or adnexal tenderness. Urinalysis does not suggest UTI. Urine pregnancy is negative. Patient also without evidence of yeast infection or bacterial vaginosis. No white blood cells on wet prep to suggest sexually  transmitted infection.  Suspect discharged to be a normal area. I do not believe the patient requires prophylactic treatment for STI's. She has been instructed to follow-up with the health department in 48 hours regarding the results of her STD tests. Return precautions discussed and provided. Patient discharged in satisfactory condition with no unaddressed concerns.   Final Clinical Impressions(s) / ED Diagnoses   Final diagnoses:  Vaginal discharge  History of unprotected sex    New Prescriptions Discharge Medication List as of 11/29/2015 10:09 PM      I personally performed the services described in this documentation, which was scribed in my presence. The recorded information has been reviewed and is accurate.       Antony Madura, PA-C 11/30/15 0000    Courteney Randall An, MD 11/30/15 1600

## 2015-11-29 NOTE — Discharge Instructions (Signed)
Your tests today were reassuring; therefore, you were not treated for sexual transmitted infections. Follow-up on the results of your STD test with the health department in 48 hours. Do not engage in sexual intercourse until you follow up on these results. If you are positive for STDs, do not engage in sexual intercourse for one week following treatment. Notify all sexual partners of their need to be tested and treated for STDs if your cultures are positive. You may return for any new or concerning symptoms.

## 2015-11-29 NOTE — ED Triage Notes (Signed)
Patient presents for dysuria and milky discharge x3 days. Denies fever, N/V. A&O x4.

## 2015-11-30 LAB — GC/CHLAMYDIA PROBE AMP (~~LOC~~) NOT AT ARMC
CHLAMYDIA, DNA PROBE: NEGATIVE
NEISSERIA GONORRHEA: NEGATIVE

## 2015-12-21 ENCOUNTER — Emergency Department (HOSPITAL_COMMUNITY): Payer: Medicaid Other

## 2015-12-21 ENCOUNTER — Encounter (HOSPITAL_COMMUNITY): Payer: Self-pay | Admitting: Emergency Medicine

## 2015-12-21 ENCOUNTER — Emergency Department (HOSPITAL_COMMUNITY)
Admission: EM | Admit: 2015-12-21 | Discharge: 2015-12-21 | Disposition: A | Discharge status: Home / Self Care | Payer: Medicaid Other | Attending: Emergency Medicine | Admitting: Emergency Medicine

## 2015-12-21 DIAGNOSIS — Y92410 Unspecified street and highway as the place of occurrence of the external cause: Secondary | ICD-10-CM | POA: Insufficient documentation

## 2015-12-21 DIAGNOSIS — J45909 Unspecified asthma, uncomplicated: Secondary | ICD-10-CM | POA: Insufficient documentation

## 2015-12-21 DIAGNOSIS — M25511 Pain in right shoulder: Secondary | ICD-10-CM | POA: Diagnosis present

## 2015-12-21 DIAGNOSIS — R0789 Other chest pain: Secondary | ICD-10-CM | POA: Diagnosis not present

## 2015-12-21 DIAGNOSIS — Z79899 Other long term (current) drug therapy: Secondary | ICD-10-CM | POA: Insufficient documentation

## 2015-12-21 DIAGNOSIS — R002 Palpitations: Secondary | ICD-10-CM | POA: Diagnosis not present

## 2015-12-21 DIAGNOSIS — Y999 Unspecified external cause status: Secondary | ICD-10-CM | POA: Diagnosis not present

## 2015-12-21 DIAGNOSIS — M542 Cervicalgia: Secondary | ICD-10-CM | POA: Diagnosis not present

## 2015-12-21 DIAGNOSIS — Y9389 Activity, other specified: Secondary | ICD-10-CM | POA: Insufficient documentation

## 2015-12-21 MED ORDER — IBUPROFEN 800 MG PO TABS: 800.0000 mg | ORAL_TABLET | Freq: Three times a day (TID) | ORAL | 0 refills | Status: DC

## 2015-12-21 MED ORDER — CYCLOBENZAPRINE HCL 10 MG PO TABS: 10.0000 mg | ORAL_TABLET | Freq: Two times a day (BID) | ORAL | 0 refills | Status: DC | PRN
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
## 2015-12-21 NOTE — ED Notes (Signed)
Pt refused arm sling.

## 2015-12-21 NOTE — ED Triage Notes (Signed)
Pt is unable to void more than a few drops. Will re-attempt after po hydration
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
## 2016-01-07 ENCOUNTER — Ambulatory Visit (HOSPITAL_COMMUNITY)
Admission: EM | Admit: 2016-01-07 | Discharge: 2016-01-07 | Disposition: A | Discharge status: Home / Self Care | Payer: Medicaid Other | Attending: Emergency Medicine | Admitting: Emergency Medicine

## 2016-01-07 ENCOUNTER — Encounter (HOSPITAL_COMMUNITY): Payer: Self-pay | Admitting: Emergency Medicine

## 2016-01-07 DIAGNOSIS — Z711 Person with feared health complaint in whom no diagnosis is made: Secondary | ICD-10-CM

## 2016-01-07 DIAGNOSIS — N898 Other specified noninflammatory disorders of vagina: Secondary | ICD-10-CM

## 2016-01-07 LAB — POCT URINALYSIS DIP (DEVICE)
BILIRUBIN URINE: NEGATIVE
GLUCOSE, UA: NEGATIVE mg/dL
HGB URINE DIPSTICK: NEGATIVE
LEUKOCYTES UA: NEGATIVE
NITRITE: NEGATIVE
Protein, ur: NEGATIVE mg/dL
Specific Gravity, Urine: 1.025 (ref 1.005–1.030)
UROBILINOGEN UA: 0.2 mg/dL (ref 0.0–1.0)
pH: 6 (ref 5.0–8.0)

## 2016-01-07 LAB — POCT PREGNANCY, URINE: PREG TEST UR: NEGATIVE

## 2016-01-07 LAB — GLUCOSE, CAPILLARY: GLUCOSE-CAPILLARY: 73 mg/dL (ref 65–99)

## 2016-01-07 MED ORDER — AZITHROMYCIN 250 MG PO TABS
ORAL_TABLET | ORAL | Status: AC
  Filled 2016-01-07: qty 4

## 2016-01-07 MED ORDER — METRONIDAZOLE 500 MG PO TABS
ORAL_TABLET | ORAL | Status: AC
  Filled 2016-01-07: qty 4

## 2016-01-07 MED ORDER — METRONIDAZOLE 500 MG PO TABS
2000.0000 mg | ORAL_TABLET | Freq: Once | ORAL | Status: AC
  Administered 2016-01-07: 2000 mg via ORAL

## 2016-01-07 MED ORDER — CEFTRIAXONE SODIUM 250 MG IJ SOLR
250.0000 mg | Freq: Once | INTRAMUSCULAR | Status: AC
  Administered 2016-01-07: 250 mg via INTRAMUSCULAR

## 2016-01-07 MED ORDER — LIDOCAINE HCL (PF) 1 % IJ SOLN
INTRAMUSCULAR | Status: AC
  Filled 2016-01-07: qty 2

## 2016-01-07 MED ORDER — CEFTRIAXONE SODIUM 250 MG IJ SOLR
INTRAMUSCULAR | Status: AC
  Filled 2016-01-07: qty 250

## 2016-01-07 MED ORDER — AZITHROMYCIN 250 MG PO TABS
1000.0000 mg | ORAL_TABLET | Freq: Once | ORAL | Status: AC
  Administered 2016-01-07: 1000 mg via ORAL

## 2016-01-07 NOTE — ED Triage Notes (Signed)
C/o vaginal discharge for four days  States discharge is milky white  Denies any itching

## 2016-01-07 NOTE — Discharge Instructions (Signed)
Do not drink while on the medication Use protection during sex.

## 2016-01-07 NOTE — ED Provider Notes (Signed)
CSN: [id number]     Arrival date & time 01/07/16  1429 History   First MD Initiated Contact with Patient 01/07/16 1539     Chief Complaint  Patient presents with  . Vaginal Discharge   (Consider location/radiation/quality/duration/timing/severity/associated sxs/prior Treatment) Pt coming in to be treated for possible STD and BV. Pt is sexual active last interaction 2 days ago with a female unprotected. Pt states that the female denies any sx. She is having a foul odor, thick white vaginal d/c itching and vaginal burning.. Denies any lower abd pain. Denies any bloody d/c. Has had a hx of this in the past.  Denies any fevers. Pt would like to "just be treated" for all of the possibilities. We discussed dong vaginal swab she prefers to have it checked in her urine.       Past Medical History:  Diagnosis Date  . Asthma   . Gestational diabetes mellitus in pregnancy    new diagnosis 01/2011  . Headache(784.0)   . History of chronic bronchitis   . History of depression    Past Surgical History:  Procedure Laterality Date  . NO PAST SURGERIES     Family History  Problem Relation Age of Onset  . Asthma Mother   . Depression Mother   . M[place]riages [place]   . Cancer Maternal Uncle   . Diabetes Maternal Grandmother   . Hypertension Maternal Grandmother   . Arthritis Paternal Grandmother   . Stroke Paternal Grandmother    Social History  Substance Use Topics  . Smoking status: Never Smoker  . Smokeless tobacco: Never Used  . Alcohol use No   OB History    Gravida Para Term Preterm AB Living   1 1 1     1    SAB TAB Ectopic Multiple Live Births                 Review of Systems  Constitutional: Negative.   Respiratory: Negative.   Cardiovascular: Negative.   Gastrointestinal: Negative.   Genitourinary: Positive for urgency, vaginal discharge and vaginal pain.       Foul odor to vaginal area, white thick discharge.   Neurological: Negative.     Allergies   Review of patient's allergies indicates no known allergies.  Home Medications   Prior to Admission medications   Medication Sig Start Date End Date Taking? Authorizing Provider  cyclobenzaprine (FLEXERIL) 10 MG tablet Take 1 tablet (10 mg total) by mouth 2 (two) times daily as needed for muscle spasms. 12/21/15   Fayrene HelperBowie Tran, PA-C  fluconazole (DIFLUCAN) 150 MG tablet Take 1 tablet (150 mg total) by mouth daily. Take one tablet today and repeat dose in three days. Patient not taking: Reported on 11/29/2015 07/25/15   Duane LopeJennifer I Rasch, NP  ibuprofen (ADVIL,MOTRIN) 800 MG tablet Take 1 tablet (800 mg total) by mouth 3 (three) times daily. 12/21/15   Fayrene HelperBowie Tran, PA-C  metroNIDAZOLE (FLAGYL) 500 MG tablet Take 1 tablet (500 mg total) by mouth 2 (two) times daily. Patient not taking: Reported on 11/29/2015 07/25/15   Duane LopeJennifer I Rasch, NP   Meds Ordered and Administered this Visit   Medications  azithromycin (ZITHROMAX) tablet 1,000 mg (not administered)  cefTRIAXone (ROCEPHIN) injection 250 mg (not administered)  metroNIDAZOLE (FLAGYL) tablet 2,000 mg (not administered)    BP 108/59 (BP Location: Left Arm)   Pulse 66   Temp 98.9 F (37.2 C) (Oral)   Resp 12   LMP 12/21/2015 (Exact Date)  SpO2 100%  No data found.   Physical Exam  Constitutional: She appears well-developed and well-nourished.  Cardiovascular: Normal rate, regular rhythm and normal heart sounds.   Pulmonary/Chest: Effort normal and breath sounds normal.  Abdominal: Soft. Bowel sounds are normal.  Genitourinary: Vaginal discharge found.  Genitourinary Comments: Thick white vaginal discharge, itching to labia majora, foul odor.   Skin: Skin is warm and dry.    Urgent Care Course   Clinical Course    Procedures (including critical care time)  Labs Review Labs Reviewed  POCT URINALYSIS DIP (DEVICE) - Abnormal; Notable for the following:       Result Value   Ketones, ur TRACE (*)    All other components within  normal limits  GLUCOSE, CAPILLARY  POCT PREGNANCY, URINE    Imaging Review No results found.          MDM   1. Concern about STD in female without diagnosis    Use protection while having sex If sx continue you may need to have a follow up pelvic exam Do not drink alcohol while taking medication. Will send off urine for culture if anything shows up that you have not been treated for we will call you,      Tobi Bastos, NP 01/07/16 1608

## 2016-02-21 ENCOUNTER — Inpatient Hospital Stay (HOSPITAL_COMMUNITY): Payer: Medicaid Other

## 2016-02-21 ENCOUNTER — Encounter (HOSPITAL_COMMUNITY): Payer: Self-pay | Admitting: *Deleted

## 2016-02-21 ENCOUNTER — Inpatient Hospital Stay (HOSPITAL_COMMUNITY)
Admission: AD | Admit: 2016-02-21 | Discharge: 2016-02-22 | Disposition: A | Discharge status: Home / Self Care | Payer: Medicaid Other | Source: Ambulatory Visit | Attending: Family Medicine | Admitting: Family Medicine

## 2016-02-21 DIAGNOSIS — Z8632 Personal history of gestational diabetes: Secondary | ICD-10-CM | POA: Insufficient documentation

## 2016-02-21 DIAGNOSIS — R300 Dysuria: Secondary | ICD-10-CM | POA: Insufficient documentation

## 2016-02-21 DIAGNOSIS — R102 Pelvic and perineal pain: Secondary | ICD-10-CM | POA: Insufficient documentation

## 2016-02-21 LAB — CBC
HCT: 34 % — ABNORMAL LOW (ref 36.0–46.0)
HEMOGLOBIN: 11.4 g/dL — AB (ref 12.0–15.0)
MCH: 24.8 pg — AB (ref 26.0–34.0)
MCHC: 33.5 g/dL (ref 30.0–36.0)
MCV: 74.1 fL — ABNORMAL LOW (ref 78.0–100.0)
PLATELETS: 226 10*3/uL (ref 150–400)
RBC: 4.59 MIL/uL (ref 3.87–5.11)
RDW: 13.2 % (ref 11.5–15.5)
WBC: 11.5 10*3/uL — AB (ref 4.0–10.5)

## 2016-02-21 LAB — WET PREP, GENITAL
Sperm: NONE SEEN
TRICH WET PREP: NONE SEEN
Yeast Wet Prep HPF POC: NONE SEEN

## 2016-02-21 LAB — URINE MICROSCOPIC-ADD ON

## 2016-02-21 LAB — URINALYSIS, ROUTINE W REFLEX MICROSCOPIC
Bilirubin Urine: NEGATIVE
GLUCOSE, UA: NEGATIVE mg/dL
KETONES UR: NEGATIVE mg/dL
NITRITE: NEGATIVE
PH: 7.5 (ref 5.0–8.0)
Protein, ur: NEGATIVE mg/dL
Specific Gravity, Urine: 1.02 (ref 1.005–1.030)

## 2016-02-21 LAB — POCT PREGNANCY, URINE: PREG TEST UR: NEGATIVE

## 2016-02-21 MED ORDER — KETOROLAC TROMETHAMINE 60 MG/2ML IM SOLN
60.0000 mg | Freq: Once | INTRAMUSCULAR | Status: AC
  Administered 2016-02-21: 60 mg via INTRAMUSCULAR
  Filled 2016-02-21: qty 2

## 2016-02-21 NOTE — MAU Note (Signed)
Pt reports "my left ovary is hurting". Reports the pain x 24 hours and a headache since yesterday.

## 2016-02-21 NOTE — MAU Provider Note (Signed)
History     CSN: [id number]  Arrival date and time: 02/21/16 2200   First Provider Initiated Contact with Patient 02/21/16 2254      Chief Complaint  Patient presents with  . Pelvic Pain   Pelvic Pain  The patient's primary symptoms include pelvic pain. This is a new problem. The current episode started yesterday. The problem occurs constantly. The problem has been unchanged. Pain severity now: 10/10. The problem affects the left side. She is not pregnant. Associated symptoms include abdominal pain, dysuria ("pressure") and headaches. Pertinent negatives include no chills, diarrhea, fever, frequency, nausea, urgency or vomiting. The vaginal discharge was mucoid. The vaginal bleeding is typical of menses. She has been passing clots (about the size of a grape ). She has not been passing tissue. The symptoms are aggravated by activity. She has tried nothing for the symptoms. She is sexually active. She uses nothing for contraception. Her menstrual history has been regular (LMP 02/14/16 and has been bleeding since. ).    Past Medical History:  Diagnosis Date  . Asthma   . Gestational diabetes mellitus in pregnancy    new diagnosis 01/2011  . Headache(784.0)   . History of chronic bronchitis   . History of depression     Past Surgical History:  Procedure Laterality Date  . NO PAST SURGERIES      Family History  Problem Relation Age of Onset  . Asthma Mother   . Depression Mother   . Miscarriages / [place] Mother   . Cancer Maternal Uncle   . Diabetes Maternal Grandmother   . Hypertension Maternal Grandmother   . Arthritis Paternal Grandmother   . Stroke Paternal Grandmother     Social History  Substance Use Topics  . Smoking status: Never Smoker  . Smokeless tobacco: Never Used  . Alcohol use No    Allergies: No Known Allergies  Prescriptions Prior to Admission  Medication Sig Dispense Refill Last Dose  . cyclobenzaprine (FLEXERIL) 10 MG tablet Take 1 tablet (10  mg total) by mouth 2 (two) times daily as needed for muscle spasms. 20 tablet 0   . fluconazole (DIFLUCAN) 150 MG tablet Take 1 tablet (150 mg total) by mouth daily. Take one tablet today and repeat dose in three days. (Patient not taking: Reported on 11/29/2015) 2 tablet 0 Not Taking at Unknown time  . ibuprofen (ADVIL,MOTRIN) 800 MG tablet Take 1 tablet (800 mg total) by mouth 3 (three) times daily. 21 tablet 0   . metroNIDAZOLE (FLAGYL) 500 MG tablet Take 1 tablet (500 mg total) by mouth 2 (two) times daily. (Patient not taking: Reported on 11/29/2015) 14 tablet 0 Not Taking at Unknown time    Review of Systems  Constitutional: Negative for chills and fever.  Gastrointestinal: Positive for abdominal pain. Negative for diarrhea, nausea and vomiting.  Genitourinary: Positive for dysuria ("pressure") and pelvic pain. Negative for frequency and urgency.  Neurological: Positive for headaches.   Physical Exam   Blood pressure 102/66, pulse 89, temperature 99 F (37.2 C), temperature source Oral, resp. rate 16, height 5\' 2"  (1.575 m), weight 179 lb (81.2 kg), last menstrual period 02/14/2016, SpO2 100 %.  Physical Exam  Nursing note and vitals reviewed. Constitutional: She is oriented to person, place, and time. She appears well-developed and well-nourished. No distress.  HENT:  Head: Normocephalic.  Cardiovascular: Normal rate.   Respiratory: Effort normal.  GI: Soft. There is no tenderness. There is no rebound.  Genitourinary:  Genitourinary Comments:  External: no  lesion Vagina: small amount of blood seen  Cervix: pink, smooth, no CMT, small amount of yellow mucousy discharge at the cervical os.  Uterus: NSSC Adnexa: NT   Neurological: She is alert and oriented to person, place, and time.  Skin: Skin is warm and dry.  Psychiatric: She has a normal mood and affect.   Results for orders placed or performed during the hospital encounter of 02/21/16 (from the past 24 hour(s))   Urinalysis, Routine w reflex microscopic ([facility])     Status: Abnormal   Collection Time: 02/21/16 10:50 PM  Result Value Ref Range   Color, Urine YELLOW YELLOW   APPearance CLEAR CLEAR   Specific Gravity, Urine 1.020 1.005 - 1.030   pH 7.5 5.0 - 8.0   Glucose, UA NEGATIVE NEGATIVE mg/dL   Hgb urine dipstick MODERATE (A) NEGATIVE   Bilirubin Urine NEGATIVE NEGATIVE   Ketones, ur NEGATIVE NEGATIVE mg/dL   Protein, ur NEGATIVE NEGATIVE mg/dL   Nitrite NEGATIVE NEGATIVE   Leukocytes, UA SMALL (A) NEGATIVE  Urine microscopic-add on     Status: Abnormal   Collection Time: 02/21/16 10:50 PM  Result Value Ref Range   Squamous Epithelial / LPF 0-5 (A) NONE SEEN   WBC, UA 6-30 0 - 5 WBC/hpf   RBC / HPF 0-5 0 - 5 RBC/hpf   Bacteria, UA FEW (A) NONE SEEN  Pregnancy, urine POC     Status: None   Collection Time: 02/21/16 10:59 PM  Result Value Ref Range   Preg Test, Ur NEGATIVE NEGATIVE  CBC     Status: Abnormal   Collection Time: 02/21/16 11:03 PM  Result Value Ref Range   WBC 11.5 (H) 4.0 - 10.5 K/uL   RBC 4.59 3.87 - 5.11 MIL/uL   Hemoglobin 11.4 (L) 12.0 - 15.0 g/dL   HCT 09.834.0 (L) 11.936.0 - 14.746.0 %   MCV 74.1 (L) 78.0 - 100.0 fL   MCH 24.8 (L) 26.0 - 34.0 pg   MCHC 33.5 30.0 - 36.0 g/dL   RDW 82.913.2 56.211.5 - 13.015.5 %   Platelets 226 150 - 400 K/uL  Wet prep, genital     Status: Abnormal   Collection Time: 02/21/16 11:18 PM  Result Value Ref Range   Yeast Wet Prep HPF POC NONE SEEN NONE SEEN   Trich, Wet Prep NONE SEEN NONE SEEN   Clue Cells Wet Prep HPF POC PRESENT (A) NONE SEEN   WBC, Wet Prep HPF POC TOO NUMEROUS TO COUNT (A) NONE SEEN   Sperm NONE SEEN    [place] Transvaginal Non-ob  Result Date: 02/22/2016 CLINICAL DATA:  Acute onset of left pelvic pain.  Initial encounter. EXAM: TRANSABDOMINAL AND TRANSVAGINAL ULTRASOUND OF PELVIS TECHNIQUE: Both transabdominal and transvaginal ultrasound examinations of the pelvis were performed. Transabdominal technique was performed for  global imaging of the pelvis including uterus, ovaries, adnexal regions, and pelvic cul-de-sac. It was necessary to proceed with endovaginal exam following the transabdominal exam to visualize the uterus and ovaries in greater detail. COMPARISON:  Pelvic ultrasound performed 03/07/2013 FINDINGS: Uterus Measurements: 8.7 x 3.7 x 4.1 cm. No fibroids or other mass visualized. Endometrium Thickness: 0.4 cm.  No focal abnormality visualized. Right ovary Measurements: 3.9 x 2.4 x 2.1 cm. Normal appearance/no adnexal mass. Left ovary Measurements: 3.0 x 2.0 x 2.4 cm. Normal appearance/no adnexal mass. Other findings No abnormal free fluid. IMPRESSION: Unremarkable pelvic ultrasound. Electronically Signed   By: Roanna RaiderJeffery  Chang M.D.   On: 02/22/2016 00:09   [place]  Pelvis Complete  Result Date: 02/22/2016 CLINICAL DATA:  Acute onset of left pelvic pain.  Initial encounter. EXAM: TRANSABDOMINAL AND TRANSVAGINAL ULTRASOUND OF PELVIS TECHNIQUE: Both transabdominal and transvaginal ultrasound examinations of the pelvis were performed. Transabdominal technique was performed for global imaging of the pelvis including uterus, ovaries, adnexal regions, and pelvic cul-de-sac. It was necessary to proceed with endovaginal exam following the transabdominal exam to visualize the uterus and ovaries in greater detail. COMPARISON:  Pelvic ultrasound performed 03/07/2013 FINDINGS: Uterus Measurements: 8.7 x 3.7 x 4.1 cm. No fibroids or other mass visualized. Endometrium Thickness: 0.4 cm.  No focal abnormality visualized. Right ovary Measurements: 3.9 x 2.4 x 2.1 cm. Normal appearance/no adnexal mass. Left ovary Measurements: 3.0 x 2.0 x 2.4 cm. Normal appearance/no adnexal mass. Other findings No abnormal free fluid. IMPRESSION: Unremarkable pelvic ultrasound. Electronically Signed   By: Roanna RaiderJeffery  Chang M.D.   On: 02/22/2016 00:09    MAU Course  Procedures  MDM   Assessment and Plan   1. Pelvic pain    DC home Comfort measures  reviewed  RX: none  Return to MAU as needed FU with OB as planned  Follow-up Information    [facility].   Contact information: [address] [place] [id number] [phone]            Tawnya CrookHogan, Bonna Steury Donovan 02/21/2016, 10:55 PM

## 2016-02-22 DIAGNOSIS — R102 Pelvic and perineal pain: Secondary | ICD-10-CM

## 2016-02-22 LAB — GC/CHLAMYDIA PROBE AMP (~~LOC~~) NOT AT ARMC
Chlamydia: NEGATIVE
Neisseria Gonorrhea: NEGATIVE

## 2016-02-22 NOTE — Discharge Instructions (Signed)

## 2016-05-11 ENCOUNTER — Inpatient Hospital Stay (HOSPITAL_COMMUNITY)
Admission: AD | Admit: 2016-05-11 | Discharge: 2016-05-11 | Disposition: A | Discharge status: Home / Self Care | Payer: Medicaid Other | Source: Ambulatory Visit | Attending: Obstetrics and Gynecology | Admitting: Obstetrics and Gynecology

## 2016-05-11 DIAGNOSIS — R2231 Localized swelling, mass and lump, right upper limb: Secondary | ICD-10-CM | POA: Diagnosis present

## 2016-05-11 DIAGNOSIS — M67431 Ganglion, right wrist: Secondary | ICD-10-CM | POA: Diagnosis not present

## 2016-05-11 NOTE — MAU Provider Note (Signed)
  History     CSN: [id number]  Arrival date and time: 05/11/16 2306   [id number] Provider Initiated Contact with Patient 05/11/16 2332      No chief complaint on file.  HPI  [name] is a 21 y.o. G1P1001 who presents to MAU today with complaint of a lump on her right wrist. She states that this started a few days ago but became more painful today. She denies redness, fever or discharge.   OB History    Gravida Para Term Preterm AB Living   1 1 1     1    SAB TAB Ectopic Multiple Live Births           1      Past Medical History:  Diagnosis Date  . Asthma   . Gestational diabetes mellitus in pregnancy    new diagnosis 01/2011  . Headache(784.0)   . History of chronic bronchitis   . History of depression     Past Surgical History:  Procedure Laterality Date  . NO PAST SURGERIES      Family History  Problem Relation Age of Onset  . Asthma Mother   . Depression Mother   . Miscarriages / [place] Mother   . Cancer Maternal Uncle   . Diabetes Maternal Grandmother   . Hypertension Maternal Grandmother   . Arthritis Paternal Grandmother   . Stroke Paternal Grandmother     Social History  Substance Use Topics  . Smoking status: Never Smoker  . Smokeless tobacco: Never Used  . Alcohol use No    Allergies: No Known Allergies  No prescriptions prior to admission.    Review of Systems  Constitutional: Negative for fever.   Physical Exam   Last menstrual period 04/10/2016.  Physical Exam  Nursing note and vitals reviewed. Constitutional: She is oriented to person, place, and time. She appears well-developed and well-nourished. No distress.  HENT:  Head: Normocephalic and atraumatic.  Cardiovascular: Normal rate.   Respiratory: Effort normal.  Neurological: She is alert and oriented to person, place, and time.  Skin: Skin is warm and dry. No erythema.     Psychiatric: She has a normal mood and affect.    MAU Course   Procedures None   Assessment and Plan  A: Ganglion Cyst, right  P:  Discharge home Warning signs for worsening condition discussed Patient advised to follow-up with Urgent Care or PCP if symptoms worsen Patient may return to MAU as needed or if her condition were to change or worsen   Marny LowensteinJulie N Wenzel, PA-C  05/11/2016, 11:35 PM

## 2016-05-11 NOTE — MAU Note (Signed)
PT  SAYS   HER  RIGHT  HAND  HURTS-   STARTED YESTERDAY       HAS  LUMP ON IT   X 1 MTH.     TODAY BOYFRIEND   WAS PUSHING  ON HAND  AND  MADE  HURT  WORSE

## 2016-07-05 ENCOUNTER — Ambulatory Visit (HOSPITAL_COMMUNITY)
Admission: EM | Admit: 2016-07-05 | Discharge: 2016-07-05 | Disposition: A | Discharge status: Other Acute Inpatient Hospital | Payer: Medicaid Other

## 2016-07-17 ENCOUNTER — Ambulatory Visit: Payer: Medicaid Other | Admitting: Obstetrics and Gynecology

## 2016-08-15 ENCOUNTER — Ambulatory Visit (INDEPENDENT_AMBULATORY_CARE_PROVIDER_SITE_OTHER): Payer: Medicaid Other | Admitting: Obstetrics and Gynecology

## 2016-08-15 ENCOUNTER — Encounter: Payer: Self-pay | Admitting: Obstetrics and Gynecology

## 2016-08-15 ENCOUNTER — Other Ambulatory Visit (HOSPITAL_COMMUNITY)
Admission: RE | Admit: 2016-08-15 | Discharge: 2016-08-15 | Disposition: A | Discharge status: Home / Self Care | Payer: Medicaid Other | Source: Ambulatory Visit | Attending: Obstetrics and Gynecology | Admitting: Obstetrics and Gynecology

## 2016-08-15 VITALS — BP 116/79 | HR 73 | Temp 98.6°F | Ht 61.0 in | Wt 198.9 lb

## 2016-08-15 DIAGNOSIS — Z01419 Encounter for gynecological examination (general) (routine) without abnormal findings: Secondary | ICD-10-CM | POA: Diagnosis not present

## 2016-08-15 DIAGNOSIS — Z0000 Encounter for general adult medical examination without abnormal findings: Secondary | ICD-10-CM | POA: Diagnosis not present

## 2016-08-15 NOTE — Progress Notes (Signed)
Subjective:     Erin Wilkinson is a 21 y.o. female G1P1 who is here for a comprehensive physical exam. The patient reports no problems. She is sexually active without contraception and is not interested in contraception at this time. She does not desire a pregnancy either. She also reports a history of recurrent BV infection and reports a vaginal discharge with odor for the past 2 weeks. She reports a monthly period lasting 4-5 days. She denies pelvic pain or abnormal bleeding  Past Medical History:  Diagnosis Date  . Asthma   . Gestational diabetes mellitus in pregnancy    new diagnosis 01/2011  . Headache(784.0)   . History of chronic bronchitis   . History of depression    Past Surgical History:  Procedure Laterality Date  . NO PAST SURGERIES     Family History  Problem Relation Age of Onset  . Asthma Mother   . Depression Mother   . Miscarriages / [place] Mother   . Cancer Maternal Uncle   . Diabetes Maternal Grandmother   . Hypertension Maternal Grandmother   . Arthritis Paternal Grandmother   . Stroke Paternal Grandmother     Social History   Social History  . Marital status: Single    Spouse name: N/A  . Number of children: N/A  . Years of education: N/A   Occupational History  . Not on file.   Social History Main Topics  . Smoking status: Never Smoker  . Smokeless tobacco: Never Used  . Alcohol use No  . Drug use: No  . Sexual activity: Yes    Partners: Male    Birth control/ protection: , None     Comment: Last encounter October 2015   Other Topics Concern  . Not on file   Social History Narrative  . No narrative on file   Health Maintenance  Topic Date Due  . TETANUS/TDAP  12/01/2012  . PAP SMEAR  12/02/2014  . INFLUENZA VACCINE  11/08/2016  . CHLAMYDIA SCREENING  02/20/2017  . HIV Screening  Completed       Review of Systems Pertinent items are noted in HPI.   Objective:  Blood pressure 116/79, pulse 73, temperature 98.6 F (37  C), height 5\' 1"  (1.549 m), weight 198 lb 14.4 oz (90.2 kg), last menstrual period 08/03/2016.     GENERAL: Well-developed, well-nourished female in no acute distress.  HEENT: Normocephalic, atraumatic. Sclerae anicteric.  NECK: Supple. Normal thyroid.  LUNGS: Clear to auscultation bilaterally.  HEART: Regular rate and rhythm. BREASTS: Symmetric in size. No palpable masses or lymphadenopathy, skin changes, or nipple drainage. ABDOMEN: Soft, nontender, nondistended.  PELVIC: Normal external female genitalia. Vagina is pink and rugated.  Normal discharge. Normal appearing cervix. Uterus is normal in size. No adnexal mass or tenderness. EXTREMITIES: No cyanosis, clubbing, or edema, 2+ distal pulses.    Assessment:    Healthy female exam.      Plan:    Pap smear collected with cultures Wet prep collected Patient will be contacted with abnormal results Information on natural family planning method provided See After Visit Summary for Counseling Recommendations

## 2016-08-15 NOTE — Patient Instructions (Addendum)
Bacterial Vaginosis Bacterial vaginosis is a vaginal infection that occurs when the normal balance of bacteria in the vagina is disrupted. It results from an overgrowth of certain bacteria. This is the most common vaginal infection among women ages 15-44. Because bacterial vaginosis increases your risk for STIs (sexually transmitted infections), getting treated can help reduce your risk for chlamydia, gonorrhea, herpes, and HIV (human immunodeficiency virus). Treatment is also important for preventing complications in pregnant women, because this condition can cause an early (premature) delivery. What are the causes? This condition is caused by an increase in harmful bacteria that are normally present in small amounts in the vagina. However, the reason that the condition develops is not fully understood. What increases the risk? The following factors may make you more likely to develop this condition:  Having a new sexual partner or multiple sexual partners.  Having unprotected sex.  Douching.  Having an intrauterine device (IUD).  Smoking.  Drug and alcohol abuse.  Taking certain antibiotic medicines.  Being pregnant. You cannot get bacterial vaginosis from toilet seats, bedding, swimming pools, or contact with objects around you. What are the signs or symptoms? Symptoms of this condition include:  Grey or white vaginal discharge. The discharge can also be watery or foamy.  A fish-like odor with discharge, especially after sexual intercourse or during menstruation.  Itching in and around the vagina.  Burning or pain with urination. Some women with bacterial vaginosis have no signs or symptoms. How is this diagnosed? This condition is diagnosed based on:  Your medical history.  A physical exam of the vagina.  Testing a sample of vaginal fluid under a microscope to look for a large amount of bad bacteria or abnormal cells. Your health care provider may use a cotton swab or a  small wooden spatula to collect the sample. How is this treated? This condition is treated with antibiotics. These may be given as a pill, a vaginal cream, or a medicine that is put into the vagina (suppository). If the condition comes back after treatment, a second round of antibiotics may be needed. Follow these instructions at home: Medicines   Take over-the-counter and prescription medicines only as told by your health care provider.  Take or use your antibiotic as told by your health care provider. Do not stop taking or using the antibiotic even if you start to feel better. General instructions   If you have a female sexual partner, tell her that you have a vaginal infection. She should see her health care provider and be treated if she has symptoms. If you have a female sexual partner, he does not need treatment.  During treatment:  Avoid sexual activity until you finish treatment.  Do not douche.  Avoid alcohol as directed by your health care provider.  Avoid breastfeeding as directed by your health care provider.  Drink enough water and fluids to keep your urine clear or pale yellow.  Keep the area around your vagina and rectum clean.  Wash the area daily with warm water.  Wipe yourself from front to back after using the toilet.  Keep all follow-up visits as told by your health care provider. This is important. How is this prevented?  Do not douche.  Wash the outside of your vagina with warm water only.  Use protection when having sex. This includes latex condoms and dental dams.  Limit how many sexual partners you have. To help prevent bacterial vaginosis, it is best to have sex with just one   partner (monogamous).  Make sure you and your sexual partner are tested for STIs.  Wear cotton or cotton-lined underwear.  Avoid wearing tight pants and pantyhose, especially during summer.  Limit the amount of alcohol that you drink.  Do not use any products that contain  nicotine or tobacco, such as cigarettes and e-cigarettes. If you need help quitting, ask your health care provider.  Do not use illegal drugs. Where to find more information:  Centers for Disease Control and Prevention: SolutionApps.co.zawww.cdc.gov/std  American Sexual Health Association (ASHA): www.ashastd.org  U.S. Department of Health and Health and safety inspectorHuman Services, Office on Women's Health: ConventionalMedicines.siwww.womenshealth.gov/ or http://www.anderson-williamson.info/https://www.womenshealth.gov/a-z-topics/bacterial-vaginosis Contact a health care provider if:  Your symptoms do not improve, even after treatment.  You have more discharge or pain when urinating.  You have a fever.  You have pain in your abdomen.  You have pain during sex.  You have vaginal bleeding between periods. Summary  Bacterial vaginosis is a vaginal infection that occurs when the normal balance of bacteria in the vagina is disrupted.  Because bacterial vaginosis increases your risk for STIs (sexually transmitted infections), getting treated can help reduce your risk for chlamydia, gonorrhea, herpes, and HIV (human immunodeficiency virus). Treatment is also important for preventing complications in pregnant women, because the condition can cause an early (premature) delivery.  This condition is treated with antibiotic medicines. These may be given as a pill, a vaginal cream, or a medicine that is put into the vagina (suppository). This information is not intended to replace advice given to you by your health care provider. Make sure you discuss any questions you have with your health care provider. Document Released: 03/27/2005 Document Revised: 12/11/2015 Document Reviewed: 12/11/2015 Elsevier Interactive Patient Education  2017 Elsevier Inc.  Natural Family Planning Introduction Natural Family Planning (NFP) is a type of birth control without using any form of contraception. Women who use NFP should not have sexual intercourse when the ovary produces an egg (ovulation) during the  menstrual cycle. The NFP method is safe and can prevent pregnancy. It is 75% effective when practiced right. The man needs to also understand this method of birth control and the woman needs to be aware of how her body functions during her menstrual cycle. NFP can also be used as a method of getting pregnant. HOW THE NFP METHOD WORKS  A woman's menstrual period usually happens every 28-30 days (it can vary from 23-35 days).  Ovulation happens 12-14 days before the start of the next menstrual period (the fertile period). The egg is fertile for 24 hours and the sperm can live for 3 days or more. If there is sexual intercourse at this time, pregnancy can occur. THERE ARE MANY TYPES OF NFP METHODS USED TO PREVENT PREGNANCY  The basal body temperature method. Often times, there is a slight increase of body temperature when a woman ovulates. Take your temperature every morning before getting out of bed. Write the temperature on a chart. An increase in the temperature shows ovulation has happened. Do not have sexual intercourse from the menstrual period up to three days after the increase in the temperature. Note that the body temperature may increase as a result of fever, restless sleep, and working schedules.  The ovulation cervical mucus method. During the menstrual cycle, the cervical mucus changes from dry and sticky to wet and slippery. Check the mucus of the vagina every day to look for these changes. Just before ovulation, the mucus becomes wet and slippery. On the last day of  wetness, ovulation happens. To avoid getting pregnant, sexual intercourse is safe for about 10 days after the menstrual period and on the dry mucus days. Do not have sexual intercourse when the mucus starts to show up and not until 4 days after the wet and slippery mucus goes away. Sexual intercourse after the 4 days have passed until the menstrual period starts is a safe time. Note that the mucus from the vagina can increase  because of a vaginal or cervical infection, lubricants, some medicines, and sexual excitement.  The symptothermal method. This method uses both the temperature and the ovulation methods. Combine the two methods above to prevent pregnancy.  The calendar method. Record your menstrual periods and length of the cycles for 6 months. This is helpful when the menstrual cycle varies in the length of the cycle. The length of a menstrual cycle is from day 1 of the present menstrual period to day 1 of the next menstrual period. Then, find your fertile days of the month and do not have sexual intercourse during that time. You may need help from your health care provider to find out your fertile days. There are some signs of ovulation that may be helpful when trying to find the time of ovulation. This includes vaginal spotting or abdominal cramps during the middle of your menstrual cycle. Not all women have these symptoms. YOU SHOULD NOT USE NFP IF:  You have very irregular menstrual periods and may skip months.  You have abnormal bleeding.  You have a vaginal or cervical infection.  You are on medicines that can affect the vaginal mucus or body temperature. These medicines include antibiotics, thyroid medicines, and antihistamines (cold and allergy medicine). This information is not intended to replace advice given to you by your health care provider. Make sure you discuss any questions you have with your health care provider. Document Released: 09/13/2007 Document Revised: 09/02/2015 Document Reviewed: 09/27/2012 Elsevier Interactive Patient Education  2017 [employer].

## 2016-08-16 LAB — CERVICOVAGINAL ANCILLARY ONLY
BACTERIAL VAGINITIS: POSITIVE — AB
CANDIDA VAGINITIS: NEGATIVE
Chlamydia: NEGATIVE
Neisseria Gonorrhea: NEGATIVE
Trichomonas: NEGATIVE

## 2016-08-17 ENCOUNTER — Other Ambulatory Visit: Payer: Self-pay | Admitting: Obstetrics and Gynecology

## 2016-08-17 LAB — CYTOLOGY - PAP
Adequacy: ABSENT
Diagnosis: NEGATIVE

## 2016-08-17 MED ORDER — METRONIDAZOLE 500 MG PO TABS
500.0000 mg | ORAL_TABLET | Freq: Two times a day (BID) | ORAL | 0 refills | Status: DC
Start: 2016-08-17 — End: 2016-10-17

## 2016-08-21 ENCOUNTER — Telehealth: Payer: Self-pay | Admitting: *Deleted

## 2016-08-21 NOTE — Telephone Encounter (Signed)
Pt made aware of lab results and Rx sent by provider.

## 2016-08-21 NOTE — Telephone Encounter (Signed)
-----   Message from Catalina AntiguaPeggy Constant, MD sent at 08/17/2016 11:33 AM EDT ----- Please inform patient of BV infection. Rx e-prescribed  Thanks  Kinder Morgan EnergyPeggy

## 2016-10-15 ENCOUNTER — Inpatient Hospital Stay (HOSPITAL_COMMUNITY)
Admission: AD | Admit: 2016-10-15 | Discharge: 2016-10-15 | Disposition: A | Discharge status: Home / Self Care | Payer: Medicaid Other | Source: Ambulatory Visit | Attending: Obstetrics & Gynecology | Admitting: Obstetrics & Gynecology

## 2016-10-15 ENCOUNTER — Encounter (HOSPITAL_COMMUNITY): Payer: Self-pay

## 2016-10-15 DIAGNOSIS — N939 Abnormal uterine and vaginal bleeding, unspecified: Secondary | ICD-10-CM | POA: Diagnosis present

## 2016-10-15 DIAGNOSIS — F329 Major depressive disorder, single episode, unspecified: Secondary | ICD-10-CM | POA: Diagnosis not present

## 2016-10-15 DIAGNOSIS — N12 Tubulo-interstitial nephritis, not specified as acute or chronic: Secondary | ICD-10-CM | POA: Diagnosis not present

## 2016-10-15 DIAGNOSIS — J45909 Unspecified asthma, uncomplicated: Secondary | ICD-10-CM | POA: Diagnosis not present

## 2016-10-15 LAB — URINALYSIS, ROUTINE W REFLEX MICROSCOPIC
BILIRUBIN URINE: NEGATIVE
Glucose, UA: NEGATIVE mg/dL
KETONES UR: NEGATIVE mg/dL
NITRITE: POSITIVE — AB
Protein, ur: 100 mg/dL — AB
SPECIFIC GRAVITY, URINE: 1.012 (ref 1.005–1.030)
pH: 5 (ref 5.0–8.0)

## 2016-10-15 LAB — POCT PREGNANCY, URINE: PREG TEST UR: NEGATIVE

## 2016-10-15 MED ORDER — ACETAMINOPHEN 500 MG PO TABS
1000.0000 mg | ORAL_TABLET | Freq: Once | ORAL | Status: AC
  Administered 2016-10-15: 1000 mg via ORAL
  Filled 2016-10-15: qty 2

## 2016-10-15 MED ORDER — CEFTRIAXONE SODIUM 1 G IJ SOLR
1.0000 g | Freq: Once | INTRAMUSCULAR | Status: AC
  Administered 2016-10-15: 1 g via INTRAMUSCULAR
  Filled 2016-10-15: qty 10

## 2016-10-15 MED ORDER — CIPROFLOXACIN HCL 500 MG PO TABS: 500.0000 mg | ORAL_TABLET | Freq: Two times a day (BID) | ORAL | 0 refills | Status: DC

## 2016-10-15 NOTE — MAU Provider Note (Signed)
History     CSN: [id number]  Arrival date and time: 10/15/16 1932   First Provider Initiated Contact with Patient 10/15/16 2032      Chief Complaint  Patient presents with  . Urinary Tract Infection   HPI Erin Wilkinson is a 21 y.o. G1P1001 non pregnant female who presents complaining of dysuria and frequency with urination for the past week. She reports a history of frequent UTI's and attempted to go see her PCP but couldn't get an appointment. She rates the pain with urination a 10/10 and has not tried anything for the pain. She reports feeling like she had a fever at home but did not check her temperature.   OB History    Gravida Para Term Preterm AB Living   1 1 1     1    SAB TAB Ectopic Multiple Live Births           1      Past Medical History:  Diagnosis Date  . Asthma   . Gestational diabetes mellitus in pregnancy    new diagnosis 01/2011  . Headache(784.0)   . History of chronic bronchitis   . History of depression     Past Surgical History:  Procedure Laterality Date  . NO PAST SURGERIES      Family History  Problem Relation Age of Onset  . Asthma Mother   . Depression Mother   . Miscarriages / [place] Mother   . Cancer Maternal Uncle   . Diabetes Maternal Grandmother   . Hypertension Maternal Grandmother   . Arthritis Paternal Grandmother   . Stroke Paternal Grandmother     Social History  Substance Use Topics  . Smoking status: Never Smoker  . Smokeless tobacco: Never Used  . Alcohol use No    Allergies: No Known Allergies  Prescriptions Prior to Admission  Medication Sig Dispense Refill Last Dose  . cyclobenzaprine (FLEXERIL) 10 MG tablet Take 1 tablet (10 mg total) by mouth 2 (two) times daily as needed for muscle spasms. 20 tablet 0   . fluconazole (DIFLUCAN) 150 MG tablet Take 1 tablet (150 mg total) by mouth daily. Take one tablet today and repeat dose in three days. (Patient not taking: Reported on 11/29/2015) 2 tablet 0 Not Taking  at Unknown time  . ibuprofen (ADVIL,MOTRIN) 800 MG tablet Take 1 tablet (800 mg total) by mouth 3 (three) times daily. 21 tablet 0   . metroNIDAZOLE (FLAGYL) 500 MG tablet Take 1 tablet (500 mg total) by mouth 2 (two) times daily. (Patient not taking: Reported on 11/29/2015) 14 tablet 0 Not Taking at Unknown time  . metroNIDAZOLE (FLAGYL) 500 MG tablet Take 1 tablet (500 mg total) by mouth 2 (two) times daily. 14 tablet 0     Review of Systems  Constitutional: Negative.  Negative for chills and fever.  HENT: Negative.   Respiratory: Negative.  Negative for shortness of breath.   Cardiovascular: Negative.  Negative for chest pain.  Gastrointestinal: Negative for abdominal pain, constipation, diarrhea, nausea and vomiting.  Genitourinary: Positive for dysuria, frequency, hematuria and urgency. Negative for flank pain, vaginal bleeding and vaginal discharge.  Neurological: Negative.  Negative for dizziness and headaches.  Psychiatric/Behavioral: Negative.    Physical Exam   Blood pressure 92/70, pulse (!) 121, temperature (!) 102.2 F (39 C), temperature source Oral, resp. rate 20, height 5\' 2"  (1.575 m), weight 195 lb (88.5 kg), last menstrual period 08/29/2016, SpO2 99 %.  Vitals:   10/15/16 2013 10/15/16  2112  BP: 92/70 100/67  Pulse: (!) 121 (!) 113  Resp: 20 20  Temp: (!) 102.2 F (39 C) (!) 100.5 F (38.1 C)    Physical Exam  Nursing note and vitals reviewed. Constitutional: She appears well-developed and well-nourished.  HENT:  Head: Normocephalic and atraumatic.  Eyes: Conjunctivae are normal. No scleral icterus.  Cardiovascular: Normal rate, regular rhythm and normal heart sounds.   Respiratory: Effort normal and breath sounds normal. No respiratory distress.  GI: Soft. She exhibits no distension. There is tenderness. There is no CVA tenderness.  Neurological: She is alert.  Skin: Skin is warm and dry.  Psychiatric: She has a normal mood and affect. Her behavior is  normal. Judgment and thought content normal.   MAU Course  Procedures Results for orders placed or performed during the hospital encounter of 10/15/16 (from the past 24 hour(s))  Urinalysis, Routine w reflex microscopic     Status: Abnormal   Collection Time: 10/15/16  7:38 PM  Result Value Ref Range   Color, Urine YELLOW YELLOW   APPearance TURBID (A) CLEAR   Specific Gravity, Urine 1.012 1.005 - 1.030   pH 5.0 5.0 - 8.0   Glucose, UA NEGATIVE NEGATIVE mg/dL   Hgb urine dipstick LARGE (A) NEGATIVE   Bilirubin Urine NEGATIVE NEGATIVE   Ketones, ur NEGATIVE NEGATIVE mg/dL   Protein, ur 409100 (A) NEGATIVE mg/dL   Nitrite POSITIVE (A) NEGATIVE   Leukocytes, UA LARGE (A) NEGATIVE  Pregnancy, urine POC     Status: None   Collection Time: 10/15/16  7:54 PM  Result Value Ref Range   Preg Test, Ur NEGATIVE NEGATIVE   MDM UA, UPT Rocephin 1g IM Tylenol 1000mg  PO Assessment and Plan   1. Pyelonephritis    -Discharge patient home in stable condition -Prescription for Cipro 500mg  BID for 7 days -Tylenol and ibuprofen for pain and fever management prn -Encouraged to return here or to other Urgent Care/ED if she develops worsening of symptoms, increase in pain, fever, or other concerning symptoms.   Cleone SlimCaroline Neill SN[facility] 10/15/2016, 8:52 PM   I confirm that I have verified the information documented in the nurse midwife student's note and that I have also personally reperformed the physical exam and all medical decision making activities.   Thressa ShellerHeather Hogan 10:09 PM 10/15/16

## 2016-10-15 NOTE — Progress Notes (Addendum)
G1. Non pregnant. Presents to triage for symptoms of UTI that started beginning of this past week. States gets them often.  2032: Provider at bs assessing pt.   Medicated per order.   2128: Discharge instructions given with pt understanding. Pt left unit via ambulatory with SO.

## 2016-10-15 NOTE — MAU Note (Signed)
Pt here with c/o UTI, frequent urination, some blood in urine, pain with urination. Gets UTIs frequently.

## 2016-10-15 NOTE — Discharge Instructions (Signed)
Pyelonephritis, Adult Pyelonephritis is a kidney infection. The kidneys are the organs that filter a person's blood and move waste out of the bloodstream and into the urine. Urine passes from the kidneys, through the ureters, and into the bladder. There are two main types of pyelonephritis:  Infections that come on quickly without any warning (acute pyelonephritis).  Infections that last for a long period of time (chronic pyelonephritis).  In most cases, the infection clears up with treatment and does not cause further problems. More severe infections or chronic infections can sometimes spread to the bloodstream or lead to other problems with the kidneys. What are the causes? This condition is usually caused by:  Bacteria traveling from the bladder to the kidney through infected urine. The urine in the bladder can become infected with bacteria from: ? Bladder infection (cystitis). ? Inflammation of the prostate gland (prostatitis). ? Sexual intercourse, in females.  Bacteria traveling from the bloodstream to the kidney.  What increases the risk? This condition is more likely to develop in:  Pregnant women.  Older people.  People who have diabetes.  People who have kidney stones or bladder stones.  People who have other abnormalities of the kidney or ureter.  People who have a catheter placed in the bladder.  People who have cancer.  People who are sexually active.  Women who use spermicides.  People who have had a prior urinary tract infection.  What are the signs or symptoms? Symptoms of this condition include:  Frequent urination.  Strong or persistent urge to urinate.  Burning or stinging when urinating.  Abdominal pain.  Back pain.  Pain in the side or flank area.  Fever.  Chills.  Blood in the urine, or dark urine.  Nausea.  Vomiting.  How is this diagnosed? This condition may be diagnosed based on:  Medical history and physical exam.  Urine  tests.  Blood tests.  You may also have imaging tests of the kidneys, such as an ultrasound or CT scan. How is this treated? Treatment for this condition may depend on the severity of the infection.  If the infection is mild and is found early, you may be treated with antibiotic medicines taken by mouth. You will need to drink fluids to remain hydrated.  If the infection is more severe, you may need to stay in the hospital and receive antibiotics given directly into a vein through an IV tube. You may also need to receive fluids through an IV tube if you are not able to remain hydrated. After your hospital stay, you may need to take oral antibiotics for a period of time.  Other treatments may be required, depending on the cause of the infection. Follow these instructions at home: Medicines  Take over-the-counter and prescription medicines only as told by your health care provider.  If you were prescribed an antibiotic medicine, take it as told by your health care provider. Do not stop taking the antibiotic even if you start to feel better. General instructions  Drink enough fluid to keep your urine clear or pale yellow.  Avoid caffeine, tea, and carbonated beverages. They tend to irritate the bladder.  Urinate often. Avoid holding in urine for long periods of time.  Urinate before and after sex.  After a bowel movement, women should cleanse from front to back. Use each tissue only once.  Keep all follow-up visits as told by your health care provider. This is important. Contact a health care provider if:  Your symptoms   do not get better after 2 days of treatment.  Your symptoms get worse.  You have a fever. Get help right away if:  You are unable to take your antibiotics or fluids.  You have shaking chills.  You vomit.  You have severe flank or back pain.  You have extreme weakness or fainting. This information is not intended to replace advice given to you by your  health care provider. Make sure you discuss any questions you have with your health care provider. Document Released: 03/27/2005 Document Revised: 09/02/2015 Document Reviewed: 07/20/2014 Elsevier Interactive Patient Education  2018 [employer] Inc.  

## 2016-10-17 ENCOUNTER — Ambulatory Visit (HOSPITAL_COMMUNITY)
Admission: EM | Admit: 2016-10-17 | Discharge: 2016-10-17 | Disposition: A | Discharge status: Home / Self Care | Payer: Medicaid Other | Attending: Family Medicine | Admitting: Family Medicine

## 2016-10-17 ENCOUNTER — Emergency Department (HOSPITAL_COMMUNITY)
Admission: EM | Admit: 2016-10-17 | Discharge: 2016-10-17 | Disposition: A | Discharge status: Home / Self Care | Payer: Medicaid Other | Attending: Emergency Medicine | Admitting: Emergency Medicine

## 2016-10-17 ENCOUNTER — Encounter (HOSPITAL_COMMUNITY): Payer: Self-pay | Admitting: *Deleted

## 2016-10-17 DIAGNOSIS — J45909 Unspecified asthma, uncomplicated: Secondary | ICD-10-CM | POA: Insufficient documentation

## 2016-10-17 DIAGNOSIS — R1013 Epigastric pain: Secondary | ICD-10-CM | POA: Diagnosis present

## 2016-10-17 DIAGNOSIS — N12 Tubulo-interstitial nephritis, not specified as acute or chronic: Secondary | ICD-10-CM | POA: Diagnosis not present

## 2016-10-17 DIAGNOSIS — R200 Anesthesia of skin: Secondary | ICD-10-CM | POA: Insufficient documentation

## 2016-10-17 MED ORDER — GI COCKTAIL ~~LOC~~
30.0000 mL | Freq: Once | ORAL | Status: AC
  Administered 2016-10-17: 30 mL via ORAL
  Filled 2016-10-17: qty 30

## 2016-10-17 MED ORDER — CEPHALEXIN 500 MG PO CAPS: 500.0000 mg | ORAL_CAPSULE | Freq: Four times a day (QID) | ORAL | 0 refills | Status: DC

## 2016-10-17 MED ORDER — SUCRALFATE 1 GM/10ML PO SUSP: 1.0000 g | Freq: Three times a day (TID) | ORAL | 0 refills | Status: DC

## 2016-10-17 MED ORDER — CEFTRIAXONE SODIUM 1 G IJ SOLR
INTRAMUSCULAR | Status: AC
Start: 2016-10-17 — End: 2016-10-17
  Filled 2016-10-17: qty 10

## 2016-10-17 MED ORDER — OMEPRAZOLE 20 MG PO CPDR: 20.0000 mg | DELAYED_RELEASE_CAPSULE | Freq: Every day | ORAL | 0 refills | Status: DC

## 2016-10-17 MED ORDER — TRAMADOL HCL 50 MG PO TABS: 50.0000 mg | ORAL_TABLET | Freq: Four times a day (QID) | ORAL | 0 refills | Status: DC | PRN

## 2016-10-17 MED ORDER — LIDOCAINE HCL (PF) 1 % IJ SOLN
INTRAMUSCULAR | Status: AC
  Filled 2016-10-17: qty 2

## 2016-10-17 MED ORDER — CEFTRIAXONE SODIUM 1 G IJ SOLR
1.0000 g | Freq: Once | INTRAMUSCULAR | Status: AC
Start: 2016-10-17 — End: 2016-10-17
  Administered 2016-10-17: 1 g via INTRAMUSCULAR

## 2016-10-17 NOTE — ED Provider Notes (Signed)
[facility]EMER[facility]ENCY DEPT Provider Note   CSN: [id number] Arrival date & time: 10/17/16  2030     History   Chief Complaint Chief Complaint  Patient presents with  . Allergic Reaction    HPI Erin Wilkinson is a 21 y.o. female.  Patient presents to the ED with a chief complaint of epigastric pain.  She states that she has been having pain for the past 4 days.  She states that she was seen at [facility] and was treated for a UTI.  She went back to urgent care today and was switched from cipro to keflex, but states that she is uncertain why this happened because her UTI symptoms had completely resolved.  She reports that she felt misunderstood, and states that she was actually there for her 4 days of epigastric abdominal pain.  She states that the symptoms are worsened with eating and drinking.  She reports an associated burning sensation in her throat and upper abdomen.  She denies any other associated problems.  There are no other modifying factors.   The history is provided by the patient. No language interpreter was used.    Past Medical History:  Diagnosis Date  . Asthma   . Gestational diabetes mellitus in pregnancy    new diagnosis 01/2011  . Headache(784.0)   . History of chronic bronchitis   . History of depression     Patient Active Problem List   Diagnosis Date Noted  . Abnormal uterine bleeding (AUB) 06/05/2014  . Obesity (BMI 30-39.9) 08/04/2013  . Contraception management 08/04/2013    Past Surgical History:  Procedure Laterality Date  . NO PAST SURGERIES      OB History    Gravida Para Term Preterm AB Living   1 1 1     1    SAB TAB Ectopic Multiple Live Births           1       Home Medications    Prior to Admission medications   Medication Sig Start Date End Date Taking? Authorizing Provider  ibuprofen (ADVIL,MOTRIN) 200 MG tablet Take 200 mg by mouth every 6 (six) hours as needed for headache.   Yes [provider]  cephALEXin (KEFLEX) 500  MG capsule Take 1 capsule (500 mg total) by mouth 4 (four) times daily. 10/17/16   Tyrone NineGrunz, Ryan B, MD  cyclobenzaprine (FLEXERIL) 10 MG tablet Take 1 tablet (10 mg total) by mouth 2 (two) times daily as needed for muscle spasms. Patient not taking: Reported on 10/17/2016 12/21/15   Fayrene Helperran, Bowie, PA-C  ibuprofen (ADVIL,MOTRIN) 800 MG tablet Take 1 tablet (800 mg total) by mouth 3 (three) times daily. Patient not taking: Reported on 10/17/2016 12/21/15   Fayrene Helperran, Bowie, PA-C  omeprazole (PRILOSEC) 20 MG capsule Take 1 capsule (20 mg total) by mouth daily. 10/17/16   Roxy HorsemanBrowning, Eann Cleland, PA-C  sucralfate (CARAFATE) 1 GM/10ML suspension Take 10 mLs (1 g total) by mouth 4 (four) times daily -  with meals and at bedtime. 10/17/16   Roxy HorsemanBrowning, Donyae Kohn, PA-C  traMADol (ULTRAM) 50 MG tablet Take 1 tablet (50 mg total) by mouth every 6 (six) hours as needed. Patient not taking: Reported on 10/17/2016 10/17/16   Tyrone NineGrunz, Ryan B, MD    Family History Family History  Problem Relation Age of Onset  . Asthma Mother   . Depression Mother   . Miscarriages / [place] Mother   . Cancer Maternal Uncle   . Diabetes Maternal Grandmother   . Hypertension Maternal  Grandmother   . Arthritis Paternal Grandmother   . Stroke Paternal Grandmother     Social History Social History  Substance Use Topics  . Smoking status: Never Smoker  . Smokeless tobacco: Never Used  . Alcohol use No     Allergies   Patient has no known allergies.   Review of Systems Review of Systems  All other systems reviewed and are negative.    Physical Exam Updated Vital Signs BP 105/61 (BP Location: Left Arm)   Pulse 84   Temp 99.7 F (37.6 C) (Oral)   Resp 18   Ht 5\' 2"  (1.575 m)   Wt 88.5 kg (195 lb)   LMP 09/29/2016   SpO2 100%   BMI 35.67 kg/m   Physical Exam  Constitutional: She is oriented to person, place, and time. She appears well-developed and well-nourished.  HENT:  Head: Normocephalic and atraumatic.  Eyes:  Conjunctivae and EOM are normal. Pupils are equal, round, and reactive to light.  Neck: Normal range of motion. Neck supple.  Cardiovascular: Normal rate and regular rhythm.  Exam reveals no gallop and no friction rub.   No murmur heard. Pulmonary/Chest: Effort normal and breath sounds normal. No respiratory distress. She has no wheezes. She has no rales. She exhibits no tenderness.  Abdominal: Soft. Bowel sounds are normal. She exhibits no distension and no mass. There is no tenderness. There is no rebound and no guarding.  No focal abdominal tenderness, no RLQ tenderness or pain at McBurney's point, no RUQ tenderness or Murphy's sign, no left-sided abdominal tenderness, no fluid wave, or signs of peritonitis    Musculoskeletal: Normal range of motion. She exhibits no edema or tenderness.  Neurological: She is alert and oriented to person, place, and time.  Skin: Skin is warm and dry.  Psychiatric: She has a normal mood and affect. Her behavior is normal. Judgment and thought content normal.  Nursing note and vitals reviewed.    ED Treatments / Results  Labs (all labs ordered are listed, but only abnormal results are displayed) Labs Reviewed - No data to display  EKG  EKG Interpretation None       Radiology No results found.  Procedures Procedures (including critical care time)  Medications Ordered in ED Medications  gi cocktail (Maalox,Lidocaine,Donnatal) (not administered)     Initial Impression / Assessment and Plan / ED Course  I have reviewed the triage vital signs and the nursing notes.  Pertinent labs & imaging results that were available during my care of the patient were reviewed by me and considered in my medical decision making (see chart for details).     Patient with epigastric abdominal pain.  Seems to be GERD/PUD.  Will treat with carafate and omeprazole.  Given GI cocktail in the ED.  Patient declines labs, though these were offered to assess for  pancreatitis or elevated LFTs.    She did complain of having some tingling sensation in her hands, feet, and tongue after taking keflex earlier today, but these symptoms have resolved.  She denies any SOB, v/diarrhea, or rash.  I have advised her to stop the keflex since she is no longer symptomatic from the UTI.  Recommend close follow-up with PCP.  Patient understands and agrees with the plan.  Final Clinical Impressions(s) / ED Diagnoses   Final diagnoses:  Epigastric pain    New Prescriptions New Prescriptions   OMEPRAZOLE (PRILOSEC) 20 MG CAPSULE    Take 1 capsule (20 mg total) by mouth daily.  SUCRALFATE (CARAFATE) 1 GM/10ML SUSPENSION    Take 10 mLs (1 g total) by mouth 4 (four) times daily -  with meals and at bedtime.     Roxy Horseman, PA-C 10/17/16 2324    Palumbo, April, MD 10/18/16 Moses Manners

## 2016-10-17 NOTE — ED Provider Notes (Signed)
[facility]    CSN: [id number] Arrival date & time: 10/17/16  1540     History   Chief Complaint Chief Complaint  Patient presents with  . Abdominal Pain    HPI Erin Wilkinson is a 21 y.o. female with recently diagnosed pyelonephritis is presenting for abdominal pain and nausea.   She was seen in the [facility] 7/8 found to have negative UPT and nitrite-positive UTI with fever. She was given ceftriaxone and Rx for ciprofloxacin which she has been taking. No culture sent. Over the past 24 hours she's had nausea and epigastric abdominal pain. She made herself vomit to see if it would help, but it didn't. She is otherwise able to keep medications and fluids down. She began feeling febrile again today, so came to urgent care. She reports no flank pain, but still has suprapubic tenderness for several days. Dysuria is improving.  Does not drink alcohol, has not had gallbladder issues, reflux symptoms.   HPI  Past Medical History:  Diagnosis Date  . Asthma   . Gestational diabetes mellitus in pregnancy    new diagnosis 01/2011  . Headache(784.0)   . History of chronic bronchitis   . History of depression     Patient Active Problem List   Diagnosis Date Noted  . Abnormal uterine bleeding (AUB) 06/05/2014  . Obesity (BMI 30-39.9) 08/04/2013  . Contraception management 08/04/2013    Past Surgical History:  Procedure Laterality Date  . NO PAST SURGERIES      OB History    Gravida Para Term Preterm AB Living   1 1 1     1    SAB TAB Ectopic Multiple Live Births           1       Home Medications    Prior to Admission medications   Medication Sig Start Date End Date Taking? Authorizing Provider  cephALEXin (KEFLEX) 500 MG capsule Take 1 capsule (500 mg total) by mouth 4 (four) times daily. 10/17/16   Tyrone NineGrunz, Nickolis Diel B, MD  cyclobenzaprine (FLEXERIL) 10 MG tablet Take 1 tablet (10 mg total) by mouth 2 (two) times daily as needed for muscle spasms. 12/21/15   Fayrene Helperran, Bowie,  PA-C  ibuprofen (ADVIL,MOTRIN) 800 MG tablet Take 1 tablet (800 mg total) by mouth 3 (three) times daily. 12/21/15   Fayrene Helperran, Bowie, PA-C  traMADol (ULTRAM) 50 MG tablet Take 1 tablet (50 mg total) by mouth every 6 (six) hours as needed. 10/17/16   Tyrone NineGrunz, Tysean [place] B, MD    Family History Family History  Problem Relation Age of Onset  . Asthma Mother   . Depression Mother   . Miscarriages / [place] Mother   . Cancer Maternal Uncle   . Diabetes Maternal Grandmother   . Hypertension Maternal Grandmother   . Arthritis Paternal Grandmother   . Stroke Paternal Grandmother     Social History Social History  Substance Use Topics  . Smoking status: Never Smoker  . Smokeless tobacco: Never Used  . Alcohol use No     Allergies   Patient has no known allergies.   Review of Systems Review of Systems No hematemesis, diarrhea, chest pain, dyspnea.   Physical Exam Triage Vital Signs ED Triage Vitals [10/17/16 1619]  Enc Vitals Group     BP 104/63     Pulse Rate 96     Resp 18     Temp (!) 100.7 F (38.2 C)     Temp Source Oral  SpO2 99 %     Weight      Height      Head Circumference      Peak Flow      Pain Score 10     Pain Loc      Pain Edu?      Excl. in GC?    No data found.   Updated Vital Signs BP 104/63 (BP Location: Right Arm)   Pulse 96   Temp (!) 100.7 F (38.2 C) (Oral)   Resp 18   LMP 09/29/2016   SpO2 99%   Physical Exam  Constitutional: She is oriented to person, place, and time. She appears well-developed and well-nourished. No distress.  Eyes: EOM are normal. Pupils are equal, round, and reactive to light. No scleral icterus.  Neck: Neck supple. No JVD present.  Cardiovascular: Normal rate, regular rhythm, normal heart sounds and intact distal pulses.   No murmur heard. Pulmonary/Chest: Effort normal and breath sounds normal. No respiratory distress.  Abdominal: Soft. Bowel sounds are normal. She exhibits no distension. There is tenderness  (epigastric tenderness with negative Murphy's sign and no RUQ tenderness. No rebound. +Suprapubic tenderness without rebound or guarding.).  Musculoskeletal: Normal range of motion. She exhibits no edema or tenderness.  Lymphadenopathy:    She has no cervical adenopathy.  Neurological: She is alert and oriented to person, place, and time. She exhibits normal muscle tone.  Skin: Skin is warm and dry. Capillary refill takes less than 2 seconds.  Vitals reviewed.    UC Treatments / Results  Labs (all labs ordered are listed, but only abnormal results are displayed) Labs Reviewed - No data to display  EKG  EKG Interpretation None       Radiology No results found.  Procedures Procedures (including critical care time)  Medications Ordered in UC Medications  cefTRIAXone (ROCEPHIN) injection 1 g (not administered)     Initial Impression / Assessment and Plan / UC Course  I have reviewed the triage vital signs and the nursing notes.  Pertinent labs & imaging results that were available during my care of the patient were reviewed by me and considered in my medical decision making (see chart for details).  Final Clinical Impressions(s) / UC Diagnoses   Final diagnoses:  Pyelonephritis   21 y.o. female presenting for abdominal pain, nausea in the setting of recently diagnosed pyelonephritis. She got ceftriaxone and Rx for cipro. Exam is reassuring, though she has begun having low grade temperature today, concerning for resistance to fluoroquinolones (no culture sent). Nausea and epigastric pain concerning for possible reaction to antibiotic as well.  - Change cipro to keflex x14 days - Doubt urine studies would be helpful diagnostically in setting of antibiotics.  - If exam wasn't so reassuring, would proceed with CT scan. Reviewed return precautions in detail.   New Prescriptions New Prescriptions   CEPHALEXIN (KEFLEX) 500 MG CAPSULE    Take 1 capsule (500 mg total) by mouth 4  (four) times daily.   TRAMADOL (ULTRAM) 50 MG TABLET    Take 1 tablet (50 mg total) by mouth every 6 (six) hours as needed.     Tyrone Nine, MD 10/17/16 [phone]

## 2016-10-17 NOTE — ED Triage Notes (Addendum)
Upper    Abdominal   Pain   Epigastric    Area          X   4  Days        Pt  Reports  Nausea       Pt  Reports   She   Made  Herself  Vomit              Denies    Any  Urinary  Symptoms   Pt  Reports    Was   Seen    sev    Days   Ago    For   Urinary  Tract  Infection     And  Was     An  Anti  Biotic

## 2016-10-17 NOTE — ED Triage Notes (Signed)
Pt was [facility] at [facility] and received a rocephin injection and dx with UTI Pt wasn't feeling any better today and went to Urgent Care and her cipro prescription was changed to keflex Tonight the pt complains of her tongue and toes feeling numb and hurting when she tries to swallow

## 2016-10-17 NOTE — Discharge Instructions (Signed)
Stop taking cipro and start taking keflex as directed. This was sent to your pharmacy. You may also take tramadol as needed for pain.   If you have a fever in 2 days and/or if your pain has not improved, you will need a CT scan of your abdomen which can only be done at the emergency department.

## 2016-11-30 IMAGING — US US PELVIS COMPLETE
1 series · 15 of 25 positions shown · non-contrast
Comparison: Pelvic ultrasound performed 03/07/2013

CLINICAL DATA: Acute onset of left pelvic pain.  Initial encounter.

EXAM:
TRANSABDOMINAL AND TRANSVAGINAL ULTRASOUND OF PELVIS
TECHNIQUE: Both transabdominal and transvaginal ultrasound examinations of the
pelvis were performed. Transabdominal technique was performed for
global imaging of the pelvis including uterus, ovaries, adnexal
regions, and pelvic cul-de-sac. It was necessary to proceed with
endovaginal exam following the transabdominal exam to visualize the
uterus and ovaries in greater detail.

[Series 1: us pelvis complete · 15 of 51 slices shown]
[im 1/51]
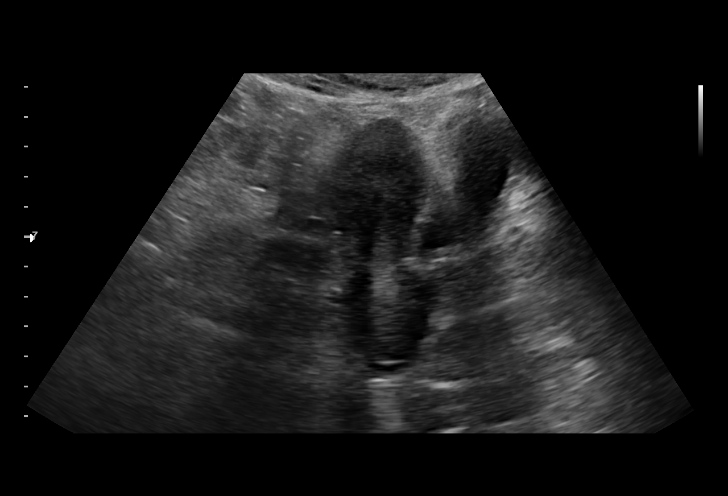
[im 5/51]
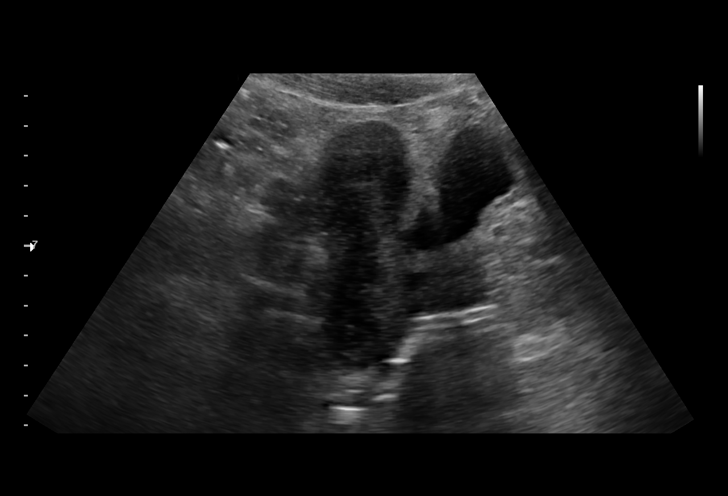
[im 9/51]
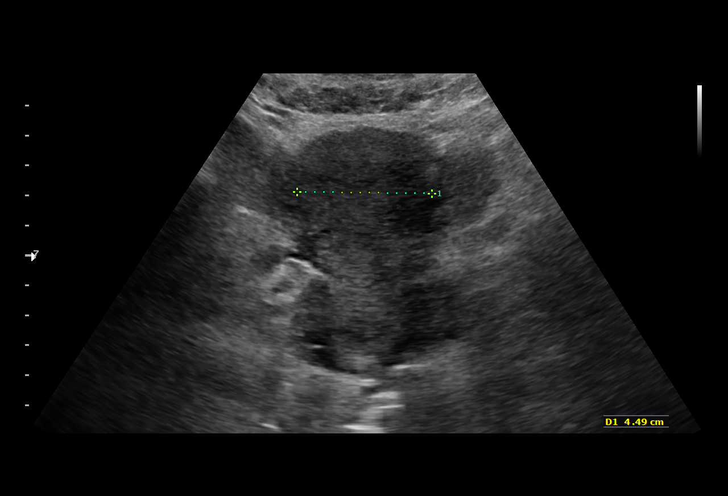
[im 11/51]
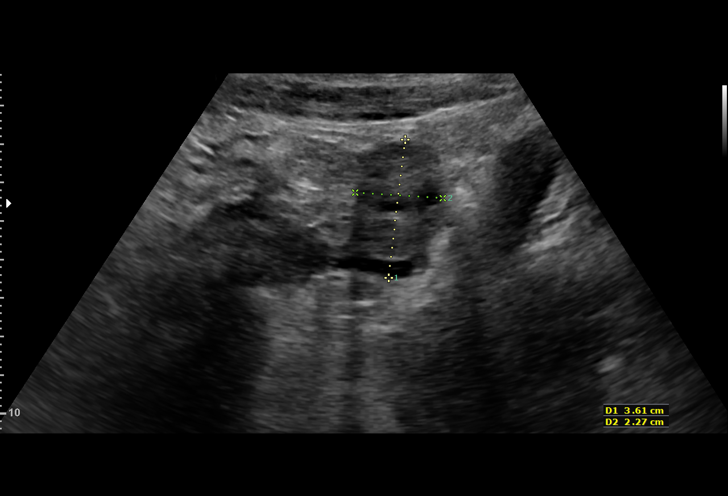
[im 15/51]
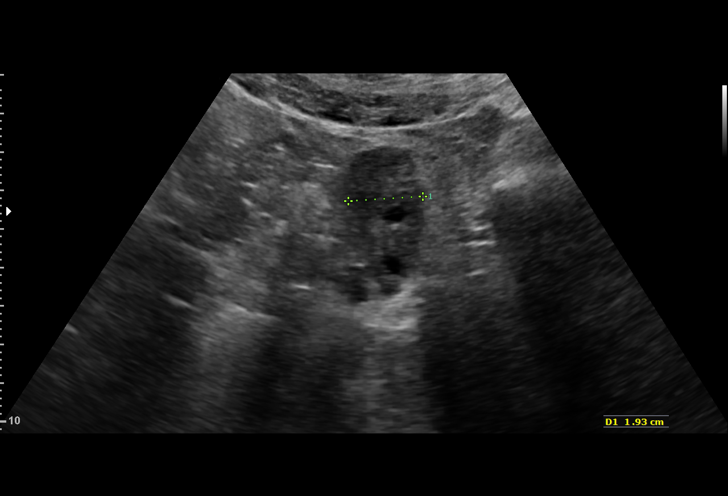
[im 19/51]
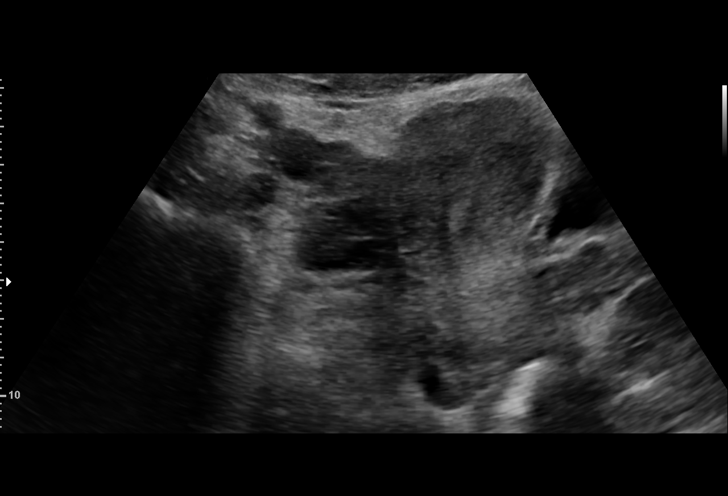
[im 21/51]
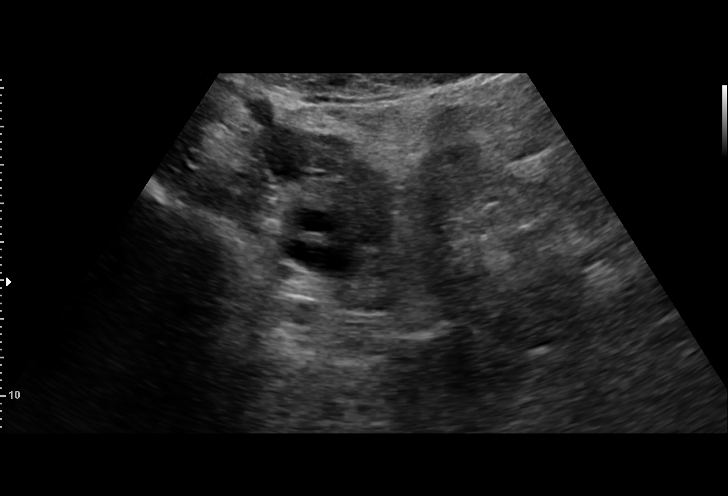
[im 26/51]
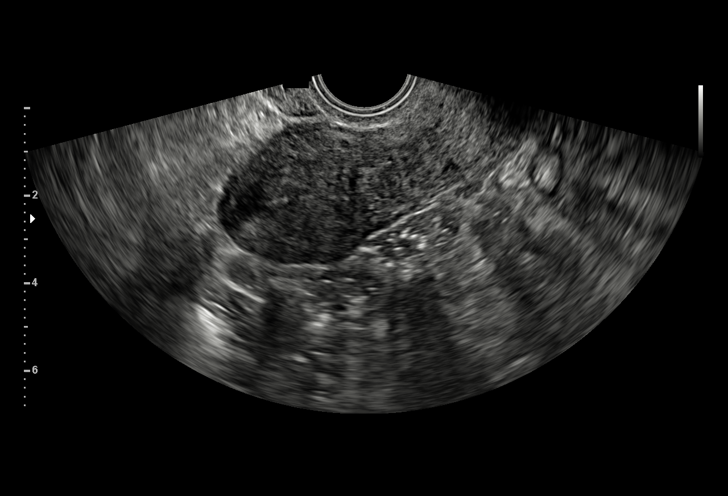
[im 30/51]
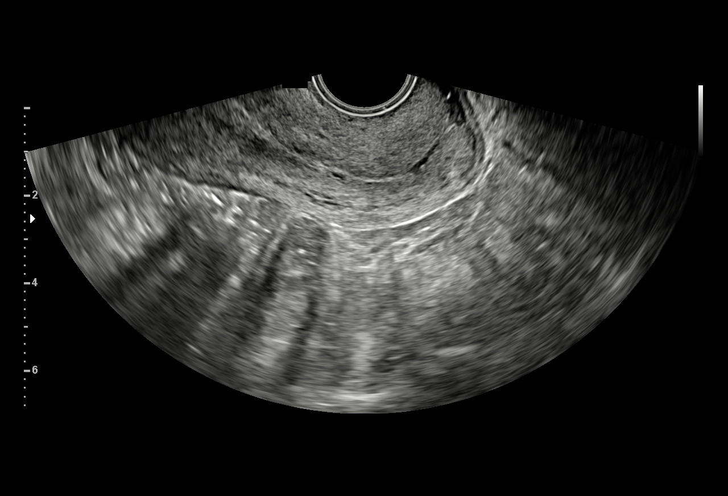
[im 32/51]
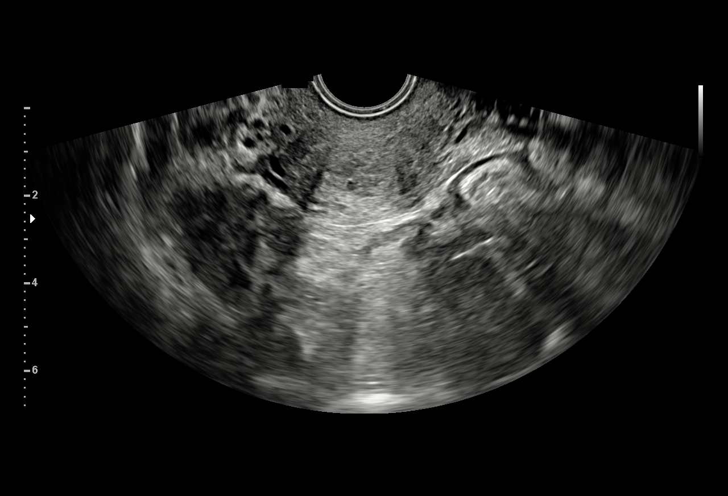
[im 36/51]
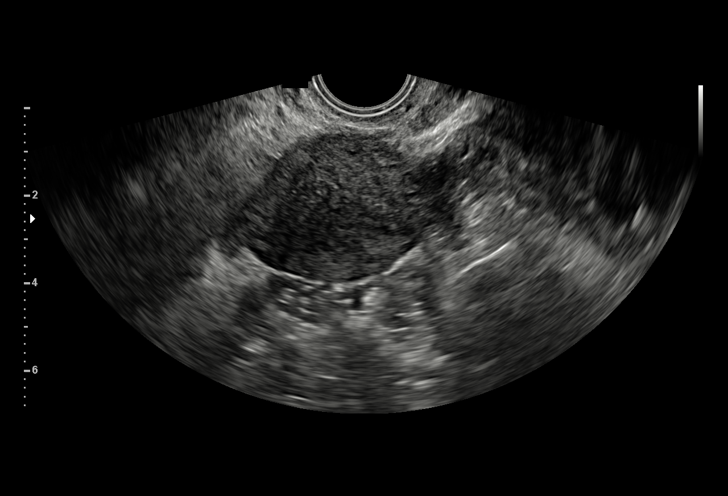
[im 40/51]
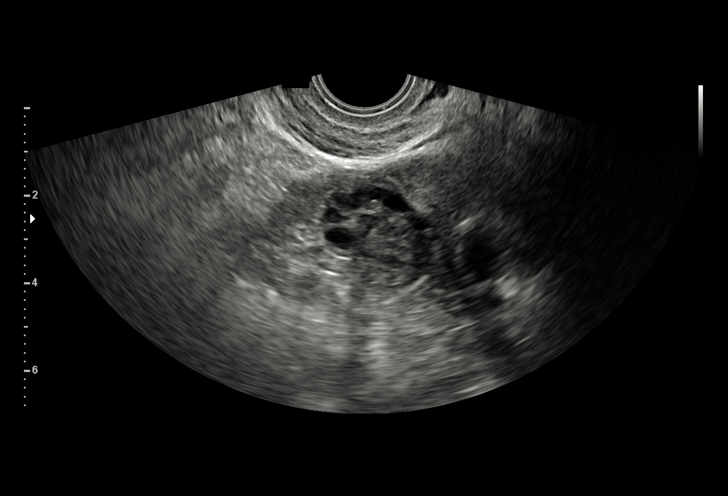
[im 42/51]
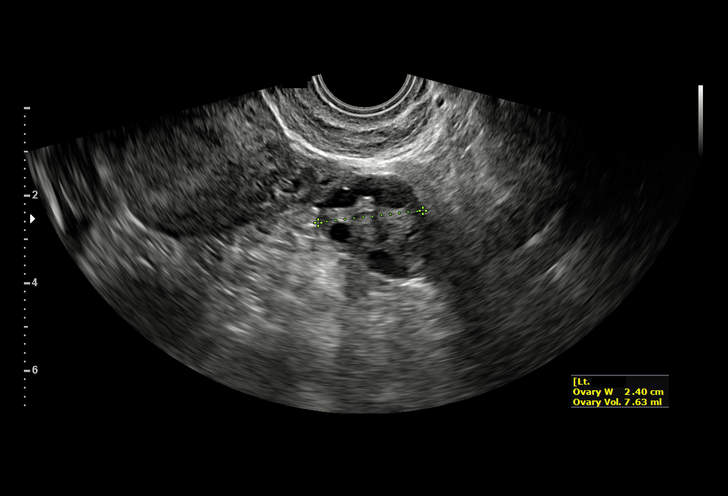
[im 46/51]
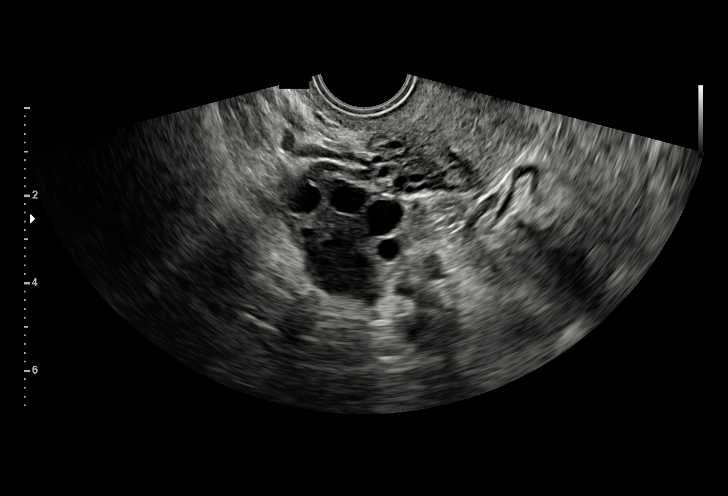
[im 51/51]
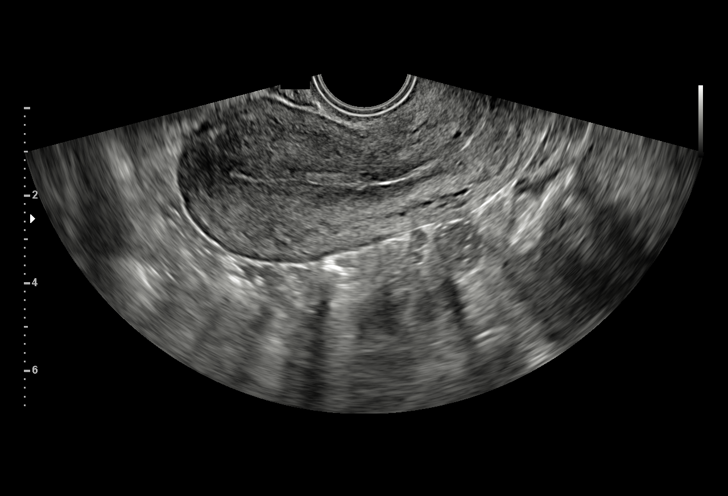

[15 of 25 positions shown; findings below may reference images not displayed]

FINDINGS: Uterus

Measurements: 8.7 x 3.7 x 4.1 cm. No fibroids or other mass
visualized.

Endometrium

Thickness: 0.4 cm.  No focal abnormality visualized.

Right ovary

Measurements: 3.9 x 2.4 x 2.1 cm. Normal appearance/no adnexal mass.

Left ovary

Measurements: 3.0 x 2.0 x 2.4 cm. Normal appearance/no adnexal mass.

Other findings

No abnormal free fluid.
IMPRESSION: Unremarkable pelvic ultrasound.

## 2016-12-27 ENCOUNTER — Encounter (HOSPITAL_COMMUNITY): Payer: Self-pay | Admitting: *Deleted

## 2016-12-27 ENCOUNTER — Inpatient Hospital Stay (HOSPITAL_COMMUNITY): Payer: Self-pay

## 2016-12-27 ENCOUNTER — Inpatient Hospital Stay (HOSPITAL_COMMUNITY)
Admission: AD | Admit: 2016-12-27 | Discharge: 2016-12-27 | Disposition: A | Discharge status: Home / Self Care | Payer: Self-pay | Source: Ambulatory Visit | Attending: Obstetrics & Gynecology | Admitting: Obstetrics & Gynecology

## 2016-12-27 DIAGNOSIS — N760 Acute vaginitis: Secondary | ICD-10-CM

## 2016-12-27 DIAGNOSIS — Z8632 Personal history of gestational diabetes: Secondary | ICD-10-CM | POA: Insufficient documentation

## 2016-12-27 DIAGNOSIS — B9689 Other specified bacterial agents as the cause of diseases classified elsewhere: Secondary | ICD-10-CM

## 2016-12-27 DIAGNOSIS — Z818 Family history of other mental and behavioral disorders: Secondary | ICD-10-CM | POA: Insufficient documentation

## 2016-12-27 DIAGNOSIS — Z3A01 Less than 8 weeks gestation of pregnancy: Secondary | ICD-10-CM | POA: Insufficient documentation

## 2016-12-27 DIAGNOSIS — R109 Unspecified abdominal pain: Secondary | ICD-10-CM | POA: Insufficient documentation

## 2016-12-27 DIAGNOSIS — O26899 Other specified pregnancy related conditions, unspecified trimester: Secondary | ICD-10-CM

## 2016-12-27 DIAGNOSIS — O26892 Other specified pregnancy related conditions, second trimester: Secondary | ICD-10-CM | POA: Insufficient documentation

## 2016-12-27 DIAGNOSIS — Z809 Family history of malignant neoplasm, unspecified: Secondary | ICD-10-CM | POA: Insufficient documentation

## 2016-12-27 DIAGNOSIS — Z8249 Family history of ischemic heart disease and other diseases of the circulatory system: Secondary | ICD-10-CM | POA: Insufficient documentation

## 2016-12-27 DIAGNOSIS — Z823 Family history of stroke: Secondary | ICD-10-CM | POA: Insufficient documentation

## 2016-12-27 DIAGNOSIS — Z3201 Encounter for pregnancy test, result positive: Secondary | ICD-10-CM | POA: Insufficient documentation

## 2016-12-27 DIAGNOSIS — Z825 Family history of asthma and other chronic lower respiratory diseases: Secondary | ICD-10-CM | POA: Insufficient documentation

## 2016-12-27 DIAGNOSIS — N730 Acute parametritis and pelvic cellulitis: Secondary | ICD-10-CM | POA: Insufficient documentation

## 2016-12-27 DIAGNOSIS — Z8279 Family history of other congenital malformations, deformations and chromosomal abnormalities: Secondary | ICD-10-CM | POA: Insufficient documentation

## 2016-12-27 DIAGNOSIS — O26891 Other specified pregnancy related conditions, first trimester: Secondary | ICD-10-CM

## 2016-12-27 DIAGNOSIS — O9989 Other specified diseases and conditions complicating pregnancy, childbirth and the puerperium: Secondary | ICD-10-CM | POA: Insufficient documentation

## 2016-12-27 DIAGNOSIS — Z8261 Family history of arthritis: Secondary | ICD-10-CM | POA: Insufficient documentation

## 2016-12-27 DIAGNOSIS — Z833 Family history of diabetes mellitus: Secondary | ICD-10-CM | POA: Insufficient documentation

## 2016-12-27 LAB — CBC
HCT: 34.8 % — ABNORMAL LOW (ref 36.0–46.0)
Hemoglobin: 11.7 g/dL — ABNORMAL LOW (ref 12.0–15.0)
MCH: 24.7 pg — ABNORMAL LOW (ref 26.0–34.0)
MCHC: 33.6 g/dL (ref 30.0–36.0)
MCV: 73.6 fL — ABNORMAL LOW (ref 78.0–100.0)
Platelets: 252 K/uL (ref 150–400)
RBC: 4.73 MIL/uL (ref 3.87–5.11)
RDW: 14.3 % (ref 11.5–15.5)
WBC: 11.7 K/uL — ABNORMAL HIGH (ref 4.0–10.5)

## 2016-12-27 LAB — WET PREP, GENITAL
SPERM: NONE SEEN
TRICH WET PREP: NONE SEEN
YEAST WET PREP: NONE SEEN

## 2016-12-27 LAB — URINALYSIS, ROUTINE W REFLEX MICROSCOPIC
BILIRUBIN URINE: NEGATIVE
GLUCOSE, UA: NEGATIVE mg/dL
HGB URINE DIPSTICK: NEGATIVE
KETONES UR: 5 mg/dL — AB
Nitrite: NEGATIVE
PH: 5 (ref 5.0–8.0)
PROTEIN: NEGATIVE mg/dL
Specific Gravity, Urine: 1.026 (ref 1.005–1.030)

## 2016-12-27 LAB — POCT PREGNANCY, URINE: Preg Test, Ur: POSITIVE — AB

## 2016-12-27 LAB — ABO/RH: ABO/RH(D): O POS

## 2016-12-27 LAB — HCG, QUANTITATIVE, PREGNANCY: HCG, BETA CHAIN, QUANT, S: 78006 m[IU]/mL — AB (ref <5)

## 2016-12-27 MED ORDER — AZITHROMYCIN 250 MG PO TABS: 1000.0000 mg | ORAL_TABLET | Freq: Once | ORAL | 0 refills | Status: AC

## 2016-12-27 MED ORDER — METRONIDAZOLE 500 MG PO TABS: 500.0000 mg | ORAL_TABLET | Freq: Two times a day (BID) | ORAL | 0 refills | Status: DC

## 2016-12-27 MED ORDER — CEFTRIAXONE SODIUM 250 MG IJ SOLR
250.0000 mg | Freq: Once | INTRAMUSCULAR | Status: AC
  Administered 2016-12-27: 250 mg via INTRAMUSCULAR
  Filled 2016-12-27: qty 250

## 2016-12-27 MED ORDER — AZITHROMYCIN 250 MG PO TABS
1000.0000 mg | ORAL_TABLET | Freq: Once | ORAL | Status: AC
  Administered 2016-12-27: 1000 mg via ORAL
  Filled 2016-12-27: qty 4

## 2016-12-27 NOTE — MAU Provider Note (Signed)
History     CSN: [id number]  Arrival date and time: 12/27/16 1619   First Provider Initiated Contact with Patient 12/27/16 1825      Chief Complaint  Patient presents with  . Abdominal Pain   HPI    Erin Wilkinson is a 21 y.o.female G2P1001 @ [redacted]w[redacted]d presenting to MAU for evaluation of moderate abdominal pain x 2 weeks. Patient describes pain in mid epigastric and bilateral suprapubic regions. States pain has progressively worsened since onset and is described as "sharp" and "sore". Patient experiences pain intermittently throughout the day. She is currently asymptomatic. Nothing seems to make pain better or worse. She has attempted drinking gingerale without relief. Patient admits to nausea, dizziness, diarrhea x 3 today, and vaginal discharge described as white and malodorous. Patient reports history of previous STI's, UTI's, and BV. She denies any new sexual partners. Has had same new partner for a while.    OB History    Gravida Para Term Preterm AB Living   SAB TAB Ectopic Multiple Live Births           1      Past Medical History:  Diagnosis Date  . Asthma   . Gestational diabetes mellitus in pregnancy    new diagnosis 01/2011  . Headache(784.0)   . History of chronic bronchitis   . History of depression     Past Surgical History:  Procedure Laterality Date  . NO PAST SURGERIES      Family History  Problem Relation Age of Onset  . Asthma Mother   . Depression Mother   . Miscarriages / [place] Mother   . Cancer Maternal Uncle   . Diabetes Maternal Grandmother   . Hypertension Maternal Grandmother   . Arthritis Paternal Grandmother   . Stroke Paternal Grandmother     Social History  Substance Use Topics  . Smoking status: Never Smoker  . Smokeless tobacco: Never Used  . Alcohol use No    Allergies: No Known Allergies  Prescriptions Prior to Admission  Medication Sig Dispense Refill Last Dose  . cephALEXin (KEFLEX) 500 MG capsule  Take 1 capsule (500 mg total) by mouth 4 (four) times daily. 56 capsule 0   . cyclobenzaprine (FLEXERIL) 10 MG tablet Take 1 tablet (10 mg total) by mouth 2 (two) times daily as needed for muscle spasms. (Patient not taking: Reported on 10/17/2016) 20 tablet 0 Completed Course at Unknown time  . ibuprofen (ADVIL,MOTRIN) 200 MG tablet Take 200 mg by mouth every 6 (six) hours as needed for headache.   10/16/2016 at Unknown time  . ibuprofen (ADVIL,MOTRIN) 800 MG tablet Take 1 tablet (800 mg total) by mouth 3 (three) times daily. (Patient not taking: Reported on 10/17/2016) 21 tablet 0 Completed Course at Unknown time  . omeprazole (PRILOSEC) 20 MG capsule Take 1 capsule (20 mg total) by mouth daily. 30 capsule 0   . sucralfate (CARAFATE) 1 GM/10ML suspension Take 10 mLs (1 g total) by mouth 4 (four) times daily -  with meals and at bedtime. 420 mL 0   . traMADol (ULTRAM) 50 MG tablet Take 1 tablet (50 mg total) by mouth every 6 (six) hours as needed. (Patient not taking: Reported on 10/17/2016) 15 tablet 0 Completed Course at Unknown time   Results for orders placed or performed during the hospital encounter of 12/27/16 (from the past 48 hour(s))  Urinalysis, Routine w reflex microscopic  Status: Abnormal   Collection Time: 12/27/16  4:52 PM  Result Value Ref Range   Color, Urine YELLOW YELLOW   APPearance HAZY (A) CLEAR   Specific Gravity, Urine 1.026 1.005 - 1.030   pH 5.0 5.0 - 8.0   Glucose, UA NEGATIVE NEGATIVE mg/dL   Hgb urine dipstick NEGATIVE NEGATIVE   Bilirubin Urine NEGATIVE NEGATIVE   Ketones, ur 5 (A) NEGATIVE mg/dL   Protein, ur NEGATIVE NEGATIVE mg/dL   Nitrite NEGATIVE NEGATIVE   Leukocytes, UA TRACE (A) NEGATIVE   RBC / HPF 0-5 0 - 5 RBC/hpf   WBC, UA 0-5 0 - 5 WBC/hpf   Bacteria, UA RARE (A) NONE SEEN   Squamous Epithelial / LPF 0-5 (A) NONE SEEN   Mucus PRESENT   Pregnancy, urine POC     Status: Abnormal   Collection Time: 12/27/16  4:58 PM  Result Value Ref Range    Preg Test, Ur POSITIVE (A) NEGATIVE    Comment:        THE SENSITIVITY OF THIS METHODOLOGY IS >24 mIU/mL   CBC     Status: Abnormal   Collection Time: 12/27/16  6:34 PM  Result Value Ref Range   WBC 11.7 (H) 4.0 - 10.5 K/uL   RBC 4.73 3.87 - 5.11 MIL/uL   Hemoglobin 11.7 (L) 12.0 - 15.0 g/dL    Comment: REPEATED TO VERIFY   HCT 34.8 (L) 36.0 - 46.0 %   MCV 73.6 (L) 78.0 - 100.0 fL   MCH 24.7 (L) 26.0 - 34.0 pg   MCHC 33.6 30.0 - 36.0 g/dL   RDW 16.1 09.6 - 04.5 %   Platelets 252 150 - 400 K/uL  ABO/Rh     Status: None   Collection Time: 12/27/16  6:34 PM  Result Value Ref Range   ABO/RH(D) O POS   hCG, quantitative, pregnancy     Status: Abnormal   Collection Time: 12/27/16  6:34 PM  Result Value Ref Range   hCG, Beta Chain, Quant, S 78,006 (H) <5 mIU/mL    Comment:          GEST. AGE      CONC.  (mIU/mL)   <=1 WEEK        5 - 50     2 WEEKS       50 - 500     3 WEEKS       100 - 10,000     4 WEEKS     1,000 - 30,000     5 WEEKS     3,500 - 115,000   6-8 WEEKS     12,000 - 270,000    12 WEEKS     15,000 - 220,000        FEMALE AND NON-PREGNANT FEMALE:     LESS THAN 5 mIU/mL   Wet prep, genital     Status: Abnormal   Collection Time: 12/27/16  6:45 PM  Result Value Ref Range   Yeast Wet Prep HPF POC NONE SEEN NONE SEEN   Trich, Wet Prep NONE SEEN NONE SEEN   Clue Cells Wet Prep HPF POC PRESENT (A) NONE SEEN   WBC, Wet Prep HPF POC MANY (A) NONE SEEN    Comment: MANY BACTERIA SEEN   Sperm NONE SEEN    US Ob Comp Less 14 Wks  Result Date: 12/27/2016 CLINICAL DATA:  Pain in early pregnancy. EXAM: OBSTETRIC <14 WK [place] AND TRANSVAGINAL OB US TECHNIQUE: Both transabdominal and transvaginal ultrasound examinations  were performed for complete evaluation of the gestation as well as the maternal uterus, adnexal regions, and pelvic cul-de-sac. Transvaginal technique was performed to assess early pregnancy. COMPARISON:  None. FINDINGS: Intrauterine gestational sac: Single  gestational sac with appropriate surrounding decidual reaction. Yolk sac:  Present Embryo:  Present Cardiac Activity: Present Heart Rate: 132  bpm MSD:   mm    w     d CRL:  11  mm   7 w   1 d                  [place] EDC: 08/14/2017 Subchorionic hemorrhage:  None visualized. Maternal uterus/adnexae: Maternal ovaries appear normal, probable small crenated corpus luteum within the right ovary. No mass or free fluid within either adnexal region. IMPRESSION: 1. Single live intrauterine pregnancy with estimated gestational age of [redacted] weeks and 1 day. Fetal heart rate measured at 132 beats per minute. 2. Maternal ovaries appear normal and there is no mass or free fluid seen within either adnexal region. Electronically Signed   By: Bary Richard M.D.   On: 12/27/2016 19:51   US Ob Transvaginal  Result Date: 12/27/2016 CLINICAL DATA:  Pain in early pregnancy. EXAM: OBSTETRIC <14 WK [place] AND TRANSVAGINAL OB US TECHNIQUE: Both transabdominal and transvaginal ultrasound examinations were performed for complete evaluation of the gestation as well as the maternal uterus, adnexal regions, and pelvic cul-de-sac. Transvaginal technique was performed to assess early pregnancy. COMPARISON:  None. FINDINGS: Intrauterine gestational sac: Single gestational sac with appropriate surrounding decidual reaction. Yolk sac:  Present Embryo:  Present Cardiac Activity: Present Heart Rate: 132  bpm MSD:   mm    w     d CRL:  11  mm   7 w   1 d                  [place] EDC: 08/14/2017 Subchorionic hemorrhage:  None visualized. Maternal uterus/adnexae: Maternal ovaries appear normal, probable small crenated corpus luteum within the right ovary. No mass or free fluid within either adnexal region. IMPRESSION: 1. Single live intrauterine pregnancy with estimated gestational age of [redacted] weeks and 1 day. Fetal heart rate measured at 132 beats per minute. 2. Maternal ovaries appear normal and there is no mass or free fluid seen within either adnexal region.  Electronically Signed   By: Bary Richard M.D.   On: 12/27/2016 19:51   Review of Systems  Constitutional: Negative for fever.  Gastrointestinal: Positive for abdominal pain and nausea. Negative for vomiting.  Genitourinary: Positive for vaginal discharge. Negative for dysuria and vaginal bleeding.   Physical Exam   Blood pressure 110/67, pulse 70, temperature 99.1 F (37.3 C), temperature source Oral, resp. rate 17, weight 197 lb 12 oz (89.7 kg), last menstrual period 11/11/2016, SpO2 100 %.  Physical Exam  Constitutional: She is oriented to person, place, and time. She appears well-developed and well-nourished. No distress.  HENT:  Head: Normocephalic.  Eyes: Pupils are equal, round, and reactive to light.  GI: Soft. Normal appearance. There is generalized tenderness. There is no rigidity, no rebound and no guarding.  Genitourinary:  Genitourinary Comments: Vagina - Small amount of white vaginal discharge, + odor  Cervix - No contact bleeding, no active bleeding  Bimanual exam: Cervix closed, + CMT  Uterus non tender, normal size Right Adnexal tenderness, no masses bilaterally, + suprapubic tenderness  GC/Chlam, wet prep done Chaperone present for exam.   Neurological: She is alert and oriented to person, place,  and time.  Skin: Skin is warm. She is not diaphoretic.  Psychiatric: Her behavior is normal.   MAU Course  Procedures  none  MDM  Patient presents with suprapubic abdominal pain in setting of pregnancy. I have reviewed all imaging and laboratory work up and have little concern for ectopic pregnancy; + yolk sack and embryo with fetal HB.  Based on patient's cervical motion tenderness, pelvic pain, history of previous positive GC/chlamydia per chart review, and many WBC's seen on wet prep, will treat for PID with ceftriaxone, azithromycin, and flagyl. Patient given list of providers in area to establish prenatal care. Patient advised to return should she her symptoms  worsen/do not improve or if she develops vaginal bleeding.   Urine culture pending Wet prep & GC HIV, CBC, Hcg, ABO US OB transvaginal   Assessment and Plan   A:  1. Acute PID (pelvic inflammatory disease)   2. Abdominal pain in pregnancy, antepartum   3. BV (bacterial vaginosis)     P:  Discharge home in stable condition  Rx: Azithromycin to repeat in 1 week 1 gram. Flagyl  Strict return precautions Condoms always  Start prenatal care.   Venia Carbon I, NP 12/27/2016 8:45 PM

## 2016-12-27 NOTE — Discharge Instructions (Signed)
Bacterial Vaginosis Bacterial vaginosis is an infection of the vagina. It happens when too many germs (bacteria) grow in the vagina. This infection puts you at risk for infections from sex (STIs). Treating this infection can lower your risk for some STIs. You should also treat this if you are pregnant. It can cause your baby to be born early. Follow these instructions at home: Medicines  Take over-the-counter and prescription medicines only as told by your doctor.  Take or use your antibiotic medicine as told by your doctor. Do not stop taking or using it even if you start to feel better. General instructions  If you your sexual partner is a woman, tell her that you have this infection. She needs to get treatment if she has symptoms. If you have a female partner, he does not need to be treated.  During treatment: ? Avoid sex. ? Do not douche. ? Avoid alcohol as told. ? Avoid breastfeeding as told.  Drink enough fluid to keep your pee (urine) clear or pale yellow.  Keep your vagina and butt (rectum) clean. ? Wash the area with warm water every day. ? Wipe from front to back after you use the toilet.  Keep all follow-up visits as told by your doctor. This is important. Preventing this condition  Do not douche.  Use only warm water to wash around your vagina.  Use protection when you have sex. This includes: ? Latex condoms. ? Dental dams.  Limit how many people you have sex with. It is best to only have sex with the same person (be monogamous).  Get tested for STIs. Have your partner get tested.  Wear underwear that is cotton or lined with cotton.  Avoid tight pants and pantyhose. This is most important in summer.  Do not use any products that have nicotine or tobacco in them. These include cigarettes and e-cigarettes. If you need help quitting, ask your doctor.  Do not use illegal drugs.  Limit how much alcohol you drink. Contact a doctor if:  Your symptoms do not get  better, even after you are treated.  You have more discharge or pain when you pee (urinate).  You have a fever.  You have pain in your belly (abdomen).  You have pain with sex.  Your bleed from your vagina between periods. Summary  This infection happens when too many germs (bacteria) grow in the vagina.  Treating this condition can lower your risk for some infections from sex (STIs).  You should also treat this if you are pregnant. It can cause early (premature) birth.  Do not stop taking or using your antibiotic medicine even if you start to feel better. This information is not intended to replace advice given to you by your health care provider. Make sure you discuss any questions you have with your health care provider. Document Released: 01/04/2008 Document Revised: 12/11/2015 Document Reviewed: 12/11/2015 Elsevier Interactive Patient Education  2017 [employer] Pelvic Inflammatory Disease Pelvic inflammatory disease (PID) is an infection in some or all of the female organs. PID can be in the uterus, ovaries, fallopian tubes, or the surrounding tissues that are inside the lower belly area (pelvis). PID can lead to lasting problems if it is not treated. To check for this disease, your doctor may:  Do a physical exam.  Do blood tests, urine tests, or a pregnancy test.  Look at your vaginal discharge.  Do tests to look inside the pelvis.  Test you for other infections.  Follow  these instructions at home: °· Take over-the-counter and prescription medicines only as told by your doctor. °· If you were prescribed an antibiotic medicine, take it as told by your doctor. Do not stop taking it even if you start to feel better. °· Do not have sex until treatment is done or as told by your doctor. °· Tell your sex partner if you have PID. Your partner may need to be treated. °· Keep all follow-up visits as told by your doctor. This is important. °· Your doctor may test you for  infection again 3 months after you are treated. °Contact a doctor if: °· You have more fluid (discharge) coming from your vagina or fluid that is not normal. °· Your pain does not improve. °· You throw up (vomit). °· You have a fever. °· You cannot take your medicines. °· Your partner has a sexually transmitted disease (STD). °· You have pain when you pee (urinate). °Get help right away if: °· You have more belly (abdominal) or lower belly pain. °· You have chills. °· You are not better after 72 hours. °This information is not intended to replace advice given to you by your health care provider. Make sure you discuss any questions you have with your health care provider. °Document Released: 06/23/2008 Document Revised: 09/02/2015 Document Reviewed: 05/04/2014 °Elsevier Interactive Patient Education © 2018 Elsevier Inc. ° °

## 2016-12-27 NOTE — MAU Note (Signed)
Discharge instructions reviewed with patient, patient verbalized understanding.  E-signature disconnected, pt. Signed hard copy, hard copy placed paper in chart.

## 2016-12-27 NOTE — MAU Note (Signed)
Is pregnant, has morning sickness.  Having really bad abd pain, constant pain. Something must be wrong, is sick all day, feels dizzy.

## 2016-12-27 NOTE — MAU Provider Note (Signed)
History     CSN: [id number]  Arrival date and time: 12/27/16 1619   First Provider Initiated Contact with Patient 12/27/16 1825      Chief Complaint  Patient presents with  . Abdominal Pain   HPI Erin Wilkinson is a 21 y/o female G2P1001 with EGA of [redacted]w[redacted]d who presents to the department for evaluation of moderate abdominal pain x 2 weeks. Patient describes pain in mid epigastric and bilateral suprapubic regions. States pain has progressively worsened since onset and is described as "sharp" and "sore". Patient experiences pain intermittently throughout the day. She is currently asymptomatic. Nothing seems to make pain better or worse. She has attempted drinking gingerale without relief. Patient admits to nausea, dizziness, diarrhea x 3 today, and vaginal discharge described as white and malodorous. Patient denies fever, vomiting, vaginal bleeding, headaches, blurry vision, or syncope. Patient reports history of previous STI's, UTI's, and BV. She denies any new sexual partners.    PO intake has been fair, last meal this afternoon at 1400. She is not taking a prenatal vitamin and does not have prenatal care established. She is requesting a list of providers in the area. Patient has history of gestational diabetes with last pregnancy.   OB History    Gravida Para Term Preterm AB Living   SAB TAB Ectopic Multiple Live Births           1      Past Medical History:  Diagnosis Date  . Asthma   . Gestational diabetes mellitus in pregnancy    new diagnosis 01/2011  . Headache(784.0)   . History of chronic bronchitis   . History of depression     Past Surgical History:  Procedure Laterality Date  . NO PAST SURGERIES      Family History  Problem Relation Age of Onset  . Asthma Mother   . Depression Mother   . Miscarriages / [place] Mother   . Cancer Maternal Uncle   . Diabetes Maternal Grandmother   . Hypertension Maternal Grandmother   . Arthritis Paternal  Grandmother   . Stroke Paternal Grandmother     Social History  Substance Use Topics  . Smoking status: Never Smoker  . Smokeless tobacco: Never Used  . Alcohol use No    Allergies: No Known Allergies  Prescriptions Prior to Admission  Medication Sig Dispense Refill Last Dose  . cephALEXin (KEFLEX) 500 MG capsule Take 1 capsule (500 mg total) by mouth 4 (four) times daily. 56 capsule 0   . cyclobenzaprine (FLEXERIL) 10 MG tablet Take 1 tablet (10 mg total) by mouth 2 (two) times daily as needed for muscle spasms. (Patient not taking: Reported on 10/17/2016) 20 tablet 0 Completed Course at Unknown time  . ibuprofen (ADVIL,MOTRIN) 200 MG tablet Take 200 mg by mouth every 6 (six) hours as needed for headache.   10/16/2016 at Unknown time  . ibuprofen (ADVIL,MOTRIN) 800 MG tablet Take 1 tablet (800 mg total) by mouth 3 (three) times daily. (Patient not taking: Reported on 10/17/2016) 21 tablet 0 Completed Course at Unknown time  . omeprazole (PRILOSEC) 20 MG capsule Take 1 capsule (20 mg total) by mouth daily. 30 capsule 0   . sucralfate (CARAFATE) 1 GM/10ML suspension Take 10 mLs (1 g total) by mouth 4 (four) times daily -  with meals and at bedtime. 420 mL 0   . traMADol (ULTRAM) 50 MG tablet Take 1 tablet (50 mg total) by  mouth every 6 (six) hours as needed. (Patient not taking: Reported on 10/17/2016) 15 tablet 0 Completed Course at Unknown time    Review of Systems Physical Exam   Blood pressure 111/62, pulse 69, temperature 99.1 F (37.3 C), temperature source Oral, resp. rate 16, weight 89.7 kg (197 lb 12 oz), last menstrual period 11/11/2016, SpO2 100 %.  Physical Exam  Constitutional: She is oriented to person, place, and time. She appears well-developed and well-nourished. No distress.  HENT:  Head: Normocephalic and atraumatic.  Cardiovascular: Normal rate, regular rhythm and normal heart sounds.  Exam reveals no gallop and no friction rub.   No murmur heard. Respiratory: Breath  sounds normal. No respiratory distress. She has no wheezes. She has no rales.  GI: Soft. Normal appearance and bowel sounds are normal. She exhibits no distension and no mass. There is tenderness in the epigastric area and suprapubic area. There is CVA tenderness and tenderness at McBurney's point. There is no rigidity, no rebound, no guarding and negative Murphy's sign.    Genitourinary: There is no lesion on the right labia. There is no lesion on the left labia. Uterus is tender. Cervix exhibits motion tenderness and discharge. Cervix exhibits no friability. Right adnexum displays no mass. Left adnexum displays no mass. No tenderness or bleeding in the vagina. No vaginal discharge found.  Neurological: She is alert and oriented to person, place, and time.  Skin: Skin is warm and dry.  Psychiatric: She has a normal mood and affect.    MAU Course  Procedures Results for orders placed or performed during the hospital encounter of 12/27/16 (from the past 24 hour(s))  Urinalysis, Routine w reflex microscopic     Status: Abnormal   Collection Time: 12/27/16  4:52 PM  Result Value Ref Range   Color, Urine YELLOW YELLOW   APPearance HAZY (A) CLEAR   Specific Gravity, Urine 1.026 1.005 - 1.030   pH 5.0 5.0 - 8.0   Glucose, UA NEGATIVE NEGATIVE mg/dL   Hgb urine dipstick NEGATIVE NEGATIVE   Bilirubin Urine NEGATIVE NEGATIVE   Ketones, ur 5 (A) NEGATIVE mg/dL   Protein, ur NEGATIVE NEGATIVE mg/dL   Nitrite NEGATIVE NEGATIVE   Leukocytes, UA TRACE (A) NEGATIVE   RBC / HPF 0-5 0 - 5 RBC/hpf   WBC, UA 0-5 0 - 5 WBC/hpf   Bacteria, UA RARE (A) NONE SEEN   Squamous Epithelial / LPF 0-5 (A) NONE SEEN   Mucus PRESENT   Pregnancy, urine POC     Status: Abnormal   Collection Time: 12/27/16  4:58 PM  Result Value Ref Range   Preg Test, Ur POSITIVE (A) NEGATIVE  CBC     Status: Abnormal   Collection Time: 12/27/16  6:34 PM  Result Value Ref Range   WBC 11.7 (H) 4.0 - 10.5 K/uL   RBC 4.73 3.87 -  5.11 MIL/uL   Hemoglobin 11.7 (L) 12.0 - 15.0 g/dL   HCT 16.1 (L) 09.6 - 04.5 %   MCV 73.6 (L) 78.0 - 100.0 fL   MCH 24.7 (L) 26.0 - 34.0 pg   MCHC 33.6 30.0 - 36.0 g/dL   RDW 40.9 81.1 - 91.4 %   Platelets 252 150 - 400 K/uL  Wet prep, genital     Status: Abnormal   Collection Time: 12/27/16  6:45 PM  Result Value Ref Range   Yeast Wet Prep HPF POC NONE SEEN NONE SEEN   Trich, Wet Prep NONE SEEN NONE SEEN   Clue  Cells Wet Prep HPF POC PRESENT (A) NONE SEEN   WBC, Wet Prep HPF POC MANY (A) NONE SEEN   Sperm NONE SEEN   .ultra  MDM Patient presents with suprapubic abdominal pain in setting of pregnancy. I have reviewed all imaging and laboratory work up and have little concern for ectopic pregnancy. Based on patient's cervical motion tenderness, pelvic pain, history of previous positive GC/chlamydia per chart review, and many WBC's seen on wet prep, will treat for PID with ceftriaxone, azithromycin, and flagyl. Patient given list of providers in area to establish prenatal care. Patient advised to return should she her symptoms worsen/do not improve or if she develops vaginal bleeding.   Assessment and Plan  1. PID Plan: ceftriaxone 250 mg IM, azithromycin 1 g PO and Rx metronidazole 500 mg BID x 7 days  Return if symptoms worsen or do not improve  2. Abdominal pain in pregnancy 3. Viable intrauterine pregnancy  Plan: tylenol 1g q 6 hours PRN for pain  Rx for prenatal vitamin  Patient given list of providers in area for prenatal and follow up care Patient instructed to return if she develops vaginal bleeding or if symptoms significantly worsen    Dyke Maes Cinda Hara PA-S 12/27/2016, 7:19 PM

## 2016-12-28 LAB — HIV ANTIBODY (ROUTINE TESTING W REFLEX): HIV SCREEN 4TH GENERATION: NONREACTIVE

## 2016-12-28 LAB — GC/CHLAMYDIA PROBE AMP (~~LOC~~) NOT AT ARMC
CHLAMYDIA, DNA PROBE: NEGATIVE
NEISSERIA GONORRHEA: NEGATIVE

## 2016-12-29 LAB — URINE CULTURE: SPECIAL REQUESTS: NORMAL

## 2017-02-06 ENCOUNTER — Encounter (HOSPITAL_COMMUNITY): Payer: Self-pay

## 2017-02-06 ENCOUNTER — Inpatient Hospital Stay (HOSPITAL_COMMUNITY)
Admission: AD | Admit: 2017-02-06 | Discharge: 2017-02-06 | Disposition: A | Discharge status: Home / Self Care | Payer: Medicaid Other | Source: Ambulatory Visit | Attending: Obstetrics and Gynecology | Admitting: Obstetrics and Gynecology

## 2017-02-06 DIAGNOSIS — Z818 Family history of other mental and behavioral disorders: Secondary | ICD-10-CM | POA: Diagnosis not present

## 2017-02-06 DIAGNOSIS — R300 Dysuria: Secondary | ICD-10-CM | POA: Diagnosis present

## 2017-02-06 DIAGNOSIS — N760 Acute vaginitis: Secondary | ICD-10-CM | POA: Insufficient documentation

## 2017-02-06 DIAGNOSIS — Z833 Family history of diabetes mellitus: Secondary | ICD-10-CM | POA: Diagnosis not present

## 2017-02-06 DIAGNOSIS — Z8261 Family history of arthritis: Secondary | ICD-10-CM | POA: Diagnosis not present

## 2017-02-06 DIAGNOSIS — Z8249 Family history of ischemic heart disease and other diseases of the circulatory system: Secondary | ICD-10-CM | POA: Insufficient documentation

## 2017-02-06 DIAGNOSIS — Z79899 Other long term (current) drug therapy: Secondary | ICD-10-CM | POA: Diagnosis not present

## 2017-02-06 DIAGNOSIS — O99611 Diseases of the digestive system complicating pregnancy, first trimester: Secondary | ICD-10-CM | POA: Insufficient documentation

## 2017-02-06 DIAGNOSIS — K219 Gastro-esophageal reflux disease without esophagitis: Secondary | ICD-10-CM | POA: Diagnosis not present

## 2017-02-06 DIAGNOSIS — Z8279 Family history of other congenital malformations, deformations and chromosomal abnormalities: Secondary | ICD-10-CM | POA: Insufficient documentation

## 2017-02-06 DIAGNOSIS — N898 Other specified noninflammatory disorders of vagina: Secondary | ICD-10-CM | POA: Diagnosis present

## 2017-02-06 DIAGNOSIS — Z809 Family history of malignant neoplasm, unspecified: Secondary | ICD-10-CM | POA: Diagnosis not present

## 2017-02-06 DIAGNOSIS — K2100 Gastro-esophageal reflux disease with esophagitis, without bleeding: Secondary | ICD-10-CM

## 2017-02-06 DIAGNOSIS — B9689 Other specified bacterial agents as the cause of diseases classified elsewhere: Secondary | ICD-10-CM | POA: Diagnosis not present

## 2017-02-06 DIAGNOSIS — O23591 Infection of other part of genital tract in pregnancy, first trimester: Secondary | ICD-10-CM | POA: Diagnosis not present

## 2017-02-06 DIAGNOSIS — Z823 Family history of stroke: Secondary | ICD-10-CM | POA: Diagnosis not present

## 2017-02-06 DIAGNOSIS — Z3A12 12 weeks gestation of pregnancy: Secondary | ICD-10-CM | POA: Diagnosis not present

## 2017-02-06 DIAGNOSIS — O9989 Other specified diseases and conditions complicating pregnancy, childbirth and the puerperium: Secondary | ICD-10-CM

## 2017-02-06 DIAGNOSIS — Z825 Family history of asthma and other chronic lower respiratory diseases: Secondary | ICD-10-CM | POA: Diagnosis not present

## 2017-02-06 DIAGNOSIS — O24419 Gestational diabetes mellitus in pregnancy, unspecified control: Secondary | ICD-10-CM | POA: Diagnosis not present

## 2017-02-06 DIAGNOSIS — R109 Unspecified abdominal pain: Secondary | ICD-10-CM

## 2017-02-06 HISTORY — DX: Gestational diabetes mellitus in pregnancy, unspecified control: O24.419

## 2017-02-06 HISTORY — DX: Syncope and collapse: R55

## 2017-02-06 LAB — COMPREHENSIVE METABOLIC PANEL
ALBUMIN: 3.5 g/dL (ref 3.5–5.0)
ALK PHOS: 57 U/L (ref 38–126)
ALT: 13 U/L — AB (ref 14–54)
ANION GAP: 7 (ref 5–15)
AST: 15 U/L (ref 15–41)
BILIRUBIN TOTAL: 0.4 mg/dL (ref 0.3–1.2)
BUN: 5 mg/dL — ABNORMAL LOW (ref 6–20)
CALCIUM: 8.6 mg/dL — AB (ref 8.9–10.3)
CO2: 20 mmol/L — ABNORMAL LOW (ref 22–32)
CREATININE: 0.63 mg/dL (ref 0.44–1.00)
Chloride: 107 mmol/L (ref 101–111)
GFR calc Af Amer: >60 mL/min (ref >60)
GFR calc non Af Amer: >60 mL/min (ref >60)
GLUCOSE: 97 mg/dL (ref 65–99)
Potassium: 3.7 mmol/L (ref 3.5–5.1)
Sodium: 134 mmol/L — ABNORMAL LOW (ref 135–145)
TOTAL PROTEIN: 6.6 g/dL (ref 6.5–8.1)

## 2017-02-06 LAB — URINALYSIS, ROUTINE W REFLEX MICROSCOPIC
BACTERIA UA: NONE SEEN
Bilirubin Urine: NEGATIVE
GLUCOSE, UA: NEGATIVE mg/dL
HGB URINE DIPSTICK: NEGATIVE
KETONES UR: NEGATIVE mg/dL
NITRITE: NEGATIVE
PROTEIN: 100 mg/dL — AB
Specific Gravity, Urine: 1.02 (ref 1.005–1.030)
pH: 6 (ref 5.0–8.0)

## 2017-02-06 LAB — CBC WITH DIFFERENTIAL/PLATELET
BASOS PCT: 0 %
Basophils Absolute: 0 10*3/uL (ref 0.0–0.1)
Eosinophils Absolute: 0.6 10*3/uL (ref 0.0–0.7)
Eosinophils Relative: 6 %
HEMATOCRIT: 32.7 % — AB (ref 36.0–46.0)
HEMOGLOBIN: 11.5 g/dL — AB (ref 12.0–15.0)
LYMPHS ABS: 2 10*3/uL (ref 0.7–4.0)
Lymphocytes Relative: 22 %
MCH: 26 pg (ref 26.0–34.0)
MCHC: 35.2 g/dL (ref 30.0–36.0)
MCV: 74 fL — ABNORMAL LOW (ref 78.0–100.0)
MONOS PCT: 3 %
Monocytes Absolute: 0.3 10*3/uL (ref 0.1–1.0)
NEUTROS ABS: 6.4 10*3/uL (ref 1.7–7.7)
NEUTROS PCT: 69 %
Platelets: 200 10*3/uL (ref 150–400)
RBC: 4.42 MIL/uL (ref 3.87–5.11)
RDW: 13.6 % (ref 11.5–15.5)
WBC: 9.2 10*3/uL (ref 4.0–10.5)

## 2017-02-06 LAB — WET PREP, GENITAL
Sperm: NONE SEEN
Trich, Wet Prep: NONE SEEN
Yeast Wet Prep HPF POC: NONE SEEN

## 2017-02-06 LAB — OB RESULTS CONSOLE GC/CHLAMYDIA: Gonorrhea: NEGATIVE

## 2017-02-06 MED ORDER — METRONIDAZOLE 0.75 % VA GEL: 1.0000 | Freq: Two times a day (BID) | VAGINAL | 0 refills | Status: DC

## 2017-02-06 MED ORDER — GI COCKTAIL ~~LOC~~
30.0000 mL | Freq: Once | ORAL | Status: AC
  Administered 2017-02-06: 30 mL via ORAL
  Filled 2017-02-06: qty 30

## 2017-02-06 MED ORDER — FAMOTIDINE 20 MG PO TABS: 20.0000 mg | ORAL_TABLET | Freq: Two times a day (BID) | ORAL | 1 refills | Status: DC

## 2017-02-06 NOTE — MAU Note (Signed)
+  vaginal discharge Yellow, malodorous  +dysuria  +upper and lower abdominal pain  Intermittent Cramping and sore at times Rating pain 10/10

## 2017-02-06 NOTE — Discharge Instructions (Signed)
Bacterial Vaginosis Bacterial vaginosis is an infection of the vagina. It happens when too many germs (bacteria) grow in the vagina. This infection puts you at risk for infections from sex (STIs). Treating this infection can lower your risk for some STIs. You should also treat this if you are pregnant. It can cause your baby to be born early. Follow these instructions at home: Medicines  Take over-the-counter and prescription medicines only as told by your doctor.  Take or use your antibiotic medicine as told by your doctor. Do not stop taking or using it even if you start to feel better. General instructions  If you your sexual partner is a woman, tell her that you have this infection. She needs to get treatment if she has symptoms. If you have a female partner, he does not need to be treated.  During treatment: ? Avoid sex. ? Do not douche. ? Avoid alcohol as told. ? Avoid breastfeeding as told.  Drink enough fluid to keep your pee (urine) clear or pale yellow.  Keep your vagina and butt (rectum) clean. ? Wash the area with warm water every day. ? Wipe from front to back after you use the toilet.  Keep all follow-up visits as told by your doctor. This is important. Preventing this condition  Do not douche.  Use only warm water to wash around your vagina.  Use protection when you have sex. This includes: ? Latex condoms. ? Dental dams.  Limit how many people you have sex with. It is best to only have sex with the same person (be monogamous).  Get tested for STIs. Have your partner get tested.  Wear underwear that is cotton or lined with cotton.  Avoid tight pants and pantyhose. This is most important in summer.  Do not use any products that have nicotine or tobacco in them. These include cigarettes and e-cigarettes. If you need help quitting, ask your doctor.  Do not use illegal drugs.  Limit how much alcohol you drink. Contact a doctor if:  Your symptoms do not get  better, even after you are treated.  You have more discharge or pain when you pee (urinate).  You have a fever.  You have pain in your belly (abdomen).  You have pain with sex.  Your bleed from your vagina between periods. Summary  This infection happens when too many germs (bacteria) grow in the vagina.  Treating this condition can lower your risk for some infections from sex (STIs).  You should also treat this if you are pregnant. It can cause early (premature) birth.  Do not stop taking or using your antibiotic medicine even if you start to feel better. This information is not intended to replace advice given to you by your health care provider. Make sure you discuss any questions you have with your health care provider. Document Released: 01/04/2008 Document Revised: 12/11/2015 Document Reviewed: 12/11/2015 Elsevier Interactive Patient Education  2017 [employer]  Food Choices for Gastroesophageal Reflux Disease, Adult When you have gastroesophageal reflux disease (GERD), the foods you eat and your eating habits are very important. Choosing the right foods can help ease your discomfort. What guidelines do I need to follow?  Choose fruits, vegetables, whole grains, and low-fat dairy products.  Choose low-fat meat, fish, and poultry.  Limit fats such as oils, salad dressings, butter, nuts, and avocado.  Keep a food diary. This helps you identify foods that cause symptoms.  Avoid foods that cause symptoms. These may be different for  everyone.  Eat small meals often instead of 3 large meals a day.  Eat your meals slowly, in a place where you are relaxed.  Limit fried foods.  Cook foods using methods other than frying.  Avoid drinking alcohol.  Avoid drinking large amounts of liquids with your meals.  Avoid bending over or lying down until 2-3 hours after eating. What foods are not recommended? These are some foods and drinks that may make your symptoms  worse: Vegetables Tomatoes. Tomato juice. Tomato and spaghetti sauce. Chili peppers. Onion and garlic. Horseradish. Fruits Oranges, grapefruit, and lemon (fruit and juice). Meats High-fat meats, fish, and poultry. This includes hot dogs, ribs, ham, sausage, salami, and bacon. Dairy Whole milk and chocolate milk. Sour cream. Cream. Butter. Ice cream. Cream cheese. Drinks Coffee and tea. Bubbly (carbonated) drinks or energy drinks. Condiments Hot sauce. Barbecue sauce. Sweets/Desserts Chocolate and cocoa. Donuts. Peppermint and spearmint. Fats and Oils High-fat foods. This includes [place] fries and potato chips. Other Vinegar. Strong spices. This includes black pepper, white pepper, red pepper, cayenne, curry powder, cloves, ginger, and chili powder. The items listed above may not be a complete list of foods and drinks to avoid. Contact your dietitian for more information. This information is not intended to replace advice given to you by your health care provider. Make sure you discuss any questions you have with your health care provider. Document Released: 09/26/2011 Document Revised: 09/02/2015 Document Reviewed: 01/29/2013 Elsevier Interactive Patient Education  2017 [employer].

## 2017-02-06 NOTE — MAU Provider Note (Signed)
History     CSN: [id number]  Arrival date and time: 02/06/17 6213   First Provider Initiated Contact with Patient 02/06/17 1016      Chief Complaint  Patient presents with  . Abdominal Pain  . Vaginal Discharge  . Dysuria   HPI  Erin Wilkinson is a 21 y.o. G2P1001 at [redacted]w[redacted]d who presents to MAU today with complaint of epigastric and suprapubic abdominal pain, dysuria, vaginal discharge and nausea. The patient denies vomiting, fever, vaginal bleeding today. She has noted intermittent dysuria and frequency of urination. She has not taken anything for pain. She states discharge is increased in amount and yellow. She has had issues with epigastric pain prior to pregnancy, but suprapubic pain has been present x 3 days.    OB History    Gravida Para Term Preterm AB Living   2 1 1     1    SAB TAB Ectopic Multiple Live Births           1      Past Medical History:  Diagnosis Date  . Asthma   . Gestational diabetes   . Gestational diabetes mellitus in pregnancy    new diagnosis 01/2011  . Headache(784.0)   . History of chronic bronchitis   . History of depression   . Syncope     Past Surgical History:  Procedure Laterality Date  . NO PAST SURGERIES      Family History  Problem Relation Age of Onset  . Asthma Mother   . Depression Mother   . Miscarriages / [place] Mother   . Cancer Maternal Uncle   . Diabetes Maternal Grandmother   . Hypertension Maternal Grandmother   . Arthritis Paternal Grandmother   . Stroke Paternal Grandmother     Social History  Substance Use Topics  . Smoking status: Never Smoker  . Smokeless tobacco: Never Used  . Alcohol use No    Allergies: No Known Allergies  No prescriptions prior to admission.    Review of Systems  Constitutional: Negative for fever.  Gastrointestinal: Positive for abdominal pain and nausea. Negative for constipation, diarrhea and vomiting.  Genitourinary: Positive for dysuria, frequency, pelvic  pain and vaginal discharge. Negative for flank pain, hematuria, urgency and vaginal bleeding.   Physical Exam   Blood pressure (!) 121/53, pulse 72, temperature 97.8 F (36.6 C), temperature source Oral, resp. rate 16, weight 193 lb 1.9 oz (87.6 kg), last menstrual period 11/11/2016, SpO2 100 %.  Physical Exam  Nursing note and vitals reviewed. Constitutional: She is oriented to person, place, and time. She appears well-developed and well-nourished. No distress.  HENT:  Head: Normocephalic and atraumatic.  Cardiovascular: Normal rate.   Respiratory: Effort normal.  GI: Soft. She exhibits no distension and no mass. There is tenderness (mild epigastric and suprapubic tenderness to palpation). There is no rebound and no guarding.  Genitourinary: Uterus is enlarged (appropriate for GA). Uterus is not tender. Cervix exhibits no motion tenderness, no discharge and no friability. No bleeding in the vagina. Vaginal discharge (small, white) found.  Neurological: She is alert and oriented to person, place, and time.  Skin: Skin is warm and dry. No erythema.  Psychiatric: She has a normal mood and affect.    Results for orders placed or performed during the hospital encounter of 02/06/17 (from the past 24 hour(s))  Urinalysis, Routine w reflex microscopic     Status: Abnormal   Collection Time: 02/06/17  9:49 AM  Result Value Ref Range  Color, Urine YELLOW YELLOW   APPearance CLOUDY (A) CLEAR   Specific Gravity, Urine 1.020 1.005 - 1.030   pH 6.0 5.0 - 8.0   Glucose, UA NEGATIVE NEGATIVE mg/dL   Hgb urine dipstick NEGATIVE NEGATIVE   Bilirubin Urine NEGATIVE NEGATIVE   Ketones, ur NEGATIVE NEGATIVE mg/dL   Protein, ur 409100 (A) NEGATIVE mg/dL   Nitrite NEGATIVE NEGATIVE   Leukocytes, UA LARGE (A) NEGATIVE   RBC / HPF 6-30 0 - 5 RBC/hpf   WBC, UA TOO NUMEROUS TO COUNT 0 - 5 WBC/hpf   Bacteria, UA NONE SEEN NONE SEEN   Squamous Epithelial / LPF 0-5 (A) NONE SEEN   WBC Clumps PRESENT     Mucus PRESENT   Wet prep, genital     Status: Abnormal   Collection Time: 02/06/17 10:55 AM  Result Value Ref Range   Yeast Wet Prep HPF POC NONE SEEN NONE SEEN   Trich, Wet Prep NONE SEEN NONE SEEN   Clue Cells Wet Prep HPF POC PRESENT (A) NONE SEEN   WBC, Wet Prep HPF POC MANY (A) NONE SEEN   Sperm NONE SEEN   CBC with Differential/Platelet     Status: Abnormal   Collection Time: 02/06/17 11:02 AM  Result Value Ref Range   WBC 9.2 4.0 - 10.5 K/uL   RBC 4.42 3.87 - 5.11 MIL/uL   Hemoglobin 11.5 (L) 12.0 - 15.0 g/dL   HCT 81.132.7 (L) 91.436.0 - 78.246.0 %   MCV 74.0 (L) 78.0 - 100.0 fL   MCH 26.0 26.0 - 34.0 pg   MCHC 35.2 30.0 - 36.0 g/dL   RDW 95.613.6 21.311.5 - 08.615.5 %   Platelets 200 150 - 400 K/uL   Neutrophils Relative % 69 %   Neutro Abs 6.4 1.7 - 7.7 K/uL   Lymphocytes Relative 22 %   Lymphs Abs 2.0 0.7 - 4.0 K/uL   Monocytes Relative 3 %   Monocytes Absolute 0.3 0.1 - 1.0 K/uL   Eosinophils Relative 6 %   Eosinophils Absolute 0.6 0.0 - 0.7 K/uL   Basophils Relative 0 %   Basophils Absolute 0.0 0.0 - 0.1 K/uL  Comprehensive metabolic panel     Status: Abnormal   Collection Time: 02/06/17 11:02 AM  Result Value Ref Range   Sodium 134 (L) 135 - 145 mmol/L   Potassium 3.7 3.5 - 5.1 mmol/L   Chloride 107 101 - 111 mmol/L   CO2 20 (L) 22 - 32 mmol/L   Glucose, Bld 97 65 - 99 mg/dL   BUN 5 (L) 6 - 20 mg/dL   Creatinine, Ser 5.780.63 0.44 - 1.00 mg/dL   Calcium 8.6 (L) 8.9 - 10.3 mg/dL   Total Protein 6.6 6.5 - 8.1 g/dL   Albumin 3.5 3.5 - 5.0 g/dL   AST 15 15 - 41 U/L   ALT 13 (L) 14 - 54 U/L   Alkaline Phosphatase 57 38 - 126 U/L   Total Bilirubin 0.4 0.3 - 1.2 mg/dL   GFR calc non Af Amer >60 >60 mL/min   GFR calc Af Amer >60 >60 mL/min   Anion gap 7 5 - 15    MAU Course  Procedures None  MDM FHR - 151 bpm with doppler Wet prep, GC/Chlmaydia, RPR, HIV, CBC and CMP today Gi cocktail given - patient reports almost instant resolution of upper abdominal pain Labs without gross  abnormalities  Assessment and Plan  A: SIUP at 8510w3d GERD Bacterial vaginosis  Abdominal pain in pregnancy  P:  Discharge home Rx for Metrogel and Pepcid given to patient  First trimester precautions and GERD diet discussed and included on AVS Patient advised to follow-up with [facility] as planned to start prenatal care Patient may return to MAU as needed or if her condition were to change or worsen   Vonzella Nipple, PA-C 02/06/2017, 12:30 PM

## 2017-02-06 NOTE — MAU Provider Note (Signed)
History     CSN: [id number]  Arrival date and time: 02/06/17 4098   First Provider Initiated Contact with Patient 02/06/17 1016      Chief Complaint  Patient presents with  . Abdominal Pain  . Vaginal Discharge  . Dysuria   HPI  [name] is a 21 y/o G2P1 female at [redacted]w[redacted]d who presents for abdominal pain, pain with urination, and vaginal discharge. She describes the abdominal pain as occurring in the upper and lower abdomen, with the upper abdominal pain beginning prior to her pregnancy and the lower abdominal pain beginning 3 days ago. She states that the upper abdominal pain is worse after eating, laying down, and feels like an increase in pressure. The lower abdominal pain is cramping in nature and she rates it as a 10/10. The pain does not radiate and is worse when she urinates. She reports an increase in vaginal discharge that is yellow in color and malodorous. She reports nausea throughout the pregnancy with occasional vomiting, and some lightheadedness throughout the pregnancy which she attributes to dehydration. She has had some diarrhea in the last week after not having a bowel movement for 3 days. She reports hemorrhoids for which she uses OTC cream. She denies fever, chills, vaginal bleeding, leaking of fluids, shortness of breath, and chest pain.  OB History    Gravida Para Term Preterm AB Living   2 1 1     1    SAB TAB Ectopic Multiple Live Births           1      Past Medical History:  Diagnosis Date  . Asthma   . Gestational diabetes   . Gestational diabetes mellitus in pregnancy    new diagnosis 01/2011  . Headache(784.0)   . History of chronic bronchitis   . History of depression   . Syncope     Past Surgical History:  Procedure Laterality Date  . NO PAST SURGERIES      Family History  Problem Relation Age of Onset  . Asthma Mother   . Depression Mother   . Miscarriages / [place] Mother   . Cancer Maternal Uncle   . Diabetes Maternal Grandmother    . Hypertension Maternal Grandmother   . Arthritis Paternal Grandmother   . Stroke Paternal Grandmother     Social History  Substance Use Topics  . Smoking status: Never Smoker  . Smokeless tobacco: Never Used  . Alcohol use No    Allergies: No Known Allergies  Prescriptions Prior to Admission  Medication Sig Dispense Refill Last Dose  . metroNIDAZOLE (FLAGYL) 500 MG tablet Take 1 tablet (500 mg total) by mouth 2 (two) times daily. 14 tablet 0   . omeprazole (PRILOSEC) 20 MG capsule Take 1 capsule (20 mg total) by mouth daily. 30 capsule 0   . sucralfate (CARAFATE) 1 GM/10ML suspension Take 10 mLs (1 g total) by mouth 4 (four) times daily -  with meals and at bedtime. 420 mL 0     Review of Systems  Constitutional: Negative for chills, diaphoresis and fever.  Respiratory: Negative for shortness of breath.   Cardiovascular: Negative for chest pain and palpitations.  Gastrointestinal: Positive for abdominal pain (epigastric, RLQ, and LLQ), diarrhea and nausea. Negative for vomiting.  Genitourinary: Positive for dysuria, frequency, pelvic pain, urgency and vaginal discharge (bright yellow malodorous). Negative for flank pain, hematuria and vaginal bleeding.  Musculoskeletal: Negative for back pain.  Neurological: Positive for light-headedness (she attributes to dehydration during pregnancy).  Physical Exam   Blood pressure 115/63, pulse 72, temperature 97.8 F (36.6 C), temperature source Oral, resp. rate 16, weight 87.6 kg (193 lb 1.9 oz), last menstrual period 11/11/2016, SpO2 100 %.  Physical Exam  Nursing note and vitals reviewed. Constitutional: She is oriented to person, place, and time. She appears well-developed and well-nourished.  HENT:  Head: Normocephalic and atraumatic.  Cardiovascular: Normal rate and regular rhythm.   Respiratory: Effort normal and breath sounds normal.  GI: Soft. Bowel sounds are normal. There is tenderness (epigastric, suprapubic, RLQ, LLQ).  There is rebound (RLQ). There is no guarding and no CVA tenderness.  Genitourinary: Cervix exhibits no motion tenderness, no discharge and no friability. Right adnexum displays tenderness. Left adnexum displays no tenderness. No bleeding in the vagina. Vaginal discharge (thin and white) found.  Genitourinary Comments: Cervix closed.  Musculoskeletal: Normal range of motion.  Neurological: She is alert and oriented to person, place, and time.  Skin: Skin is warm and dry.  Psychiatric: She has a normal mood and affect. Her behavior is normal. Thought content normal.   MAU Course  Procedures  MDM Urinalysis w/ culture Pelvic exam Wet prep GC/CHL probe HIV RPR CBC CMP  Results for orders placed or performed during the hospital encounter of 02/06/17 (from the past 24 hour(s))  Urinalysis, Routine w reflex microscopic     Status: Abnormal   Collection Time: 02/06/17  9:49 AM  Result Value Ref Range   Color, Urine YELLOW YELLOW   APPearance CLOUDY (A) CLEAR   Specific Gravity, Urine 1.020 1.005 - 1.030   pH 6.0 5.0 - 8.0   Glucose, UA NEGATIVE NEGATIVE mg/dL   Hgb urine dipstick NEGATIVE NEGATIVE   Bilirubin Urine NEGATIVE NEGATIVE   Ketones, ur NEGATIVE NEGATIVE mg/dL   Protein, ur 161100 (A) NEGATIVE mg/dL   Nitrite NEGATIVE NEGATIVE   Leukocytes, UA LARGE (A) NEGATIVE   RBC / HPF 6-30 0 - 5 RBC/hpf   WBC, UA TOO NUMEROUS TO COUNT 0 - 5 WBC/hpf   Bacteria, UA NONE SEEN NONE SEEN   Squamous Epithelial / LPF 0-5 (A) NONE SEEN   WBC Clumps PRESENT    Mucus PRESENT   Wet prep, genital     Status: Abnormal   Collection Time: 02/06/17 10:55 AM  Result Value Ref Range   Yeast Wet Prep HPF POC NONE SEEN NONE SEEN   Trich, Wet Prep NONE SEEN NONE SEEN   Clue Cells Wet Prep HPF POC PRESENT (A) NONE SEEN   WBC, Wet Prep HPF POC MANY (A) NONE SEEN   Sperm NONE SEEN   CBC with Differential/Platelet     Status: Abnormal   Collection Time: 02/06/17 11:02 AM  Result Value Ref Range   WBC  9.2 4.0 - 10.5 K/uL   RBC 4.42 3.87 - 5.11 MIL/uL   Hemoglobin 11.5 (L) 12.0 - 15.0 g/dL   HCT 09.632.7 (L) 04.536.0 - 40.946.0 %   MCV 74.0 (L) 78.0 - 100.0 fL   MCH 26.0 26.0 - 34.0 pg   MCHC 35.2 30.0 - 36.0 g/dL   RDW 81.113.6 91.411.5 - 78.215.5 %   Platelets 200 150 - 400 K/uL   Neutrophils Relative % 69 %   Neutro Abs 6.4 1.7 - 7.7 K/uL   Lymphocytes Relative 22 %   Lymphs Abs 2.0 0.7 - 4.0 K/uL   Monocytes Relative 3 %   Monocytes Absolute 0.3 0.1 - 1.0 K/uL   Eosinophils Relative 6 %   Eosinophils Absolute  0.6 0.0 - 0.7 K/uL   Basophils Relative 0 %   Basophils Absolute 0.0 0.0 - 0.1 K/uL  Comprehensive metabolic panel     Status: Abnormal   Collection Time: 02/06/17 11:02 AM  Result Value Ref Range   Sodium 134 (L) 135 - 145 mmol/L   Potassium 3.7 3.5 - 5.1 mmol/L   Chloride 107 101 - 111 mmol/L   CO2 20 (L) 22 - 32 mmol/L   Glucose, Bld 97 65 - 99 mg/dL   BUN 5 (L) 6 - 20 mg/dL   Creatinine, Ser 1.61 0.44 - 1.00 mg/dL   Calcium 8.6 (L) 8.9 - 10.3 mg/dL   Total Protein 6.6 6.5 - 8.1 g/dL   Albumin 3.5 3.5 - 5.0 g/dL   AST 15 15 - 41 U/L   ALT 13 (L) 14 - 54 U/L   Alkaline Phosphatase 57 38 - 126 U/L   Total Bilirubin 0.4 0.3 - 1.2 mg/dL   GFR calc non Af Amer >60 >60 mL/min   GFR calc Af Amer >60 >60 mL/min   Anion gap 7 5 - 15    Assessment and Plan  1. BV infection during pregnancy - rx'd Flagyl cream 1 applicator at night for 5 days. Patient counseled to avoid sexual intercourse while taking the medication, and use of condoms during intercourse to prevent recurrent BV infections. 2. GERD - rx'd Pepcid 20mg  BID. Patient counseled on diet changes to prevent symptoms, including avoiding acidic foods/juices, spicy food, not eating immediately before bed, and avoiding caffeine.  Julieanne Manson, PA-S 02/06/2017, 11:02 AM

## 2017-02-07 LAB — RPR: RPR: NONREACTIVE

## 2017-02-07 LAB — GC/CHLAMYDIA PROBE AMP (~~LOC~~) NOT AT ARMC
CHLAMYDIA, DNA PROBE: NEGATIVE
NEISSERIA GONORRHEA: NEGATIVE

## 2017-02-07 LAB — HIV ANTIBODY (ROUTINE TESTING W REFLEX): HIV Screen 4th Generation wRfx: NONREACTIVE

## 2017-02-08 LAB — CULTURE, OB URINE: Culture: >=100000 — AB

## 2017-02-09 ENCOUNTER — Telehealth: Payer: Self-pay | Admitting: Advanced Practice Midwife

## 2017-02-09 MED ORDER — SULFAMETHOXAZOLE-TRIMETHOPRIM 800-160 MG PO TABS: 1.0000 | ORAL_TABLET | Freq: Two times a day (BID) | ORAL | 0 refills | Status: DC

## 2017-02-09 NOTE — Telephone Encounter (Signed)
Pt returned call from this morning. Discussed UTI with pt and Rx sent to pharmacy. Pt Medicaid is pending so [facility]  [facility] [place]  Pt to pick up Bactrim but hold off on Metrogel as prescribed on 10/30.  Return to MAU if symptoms persist or worsen.  Pt states understanding.

## 2017-02-09 NOTE — Telephone Encounter (Signed)
Pt with urine culture positive for UTI.  Not sensitive to Keflex or Macrobid.  Bactrim DS BID x 7 days sent to pt pharmacy. Left message for pt to return call about lab results.

## 2017-02-18 ENCOUNTER — Inpatient Hospital Stay (HOSPITAL_COMMUNITY)
Admission: AD | Admit: 2017-02-18 | Discharge: 2017-02-19 | Discharge status: Against Medical Advice | Payer: Medicaid Other | Source: Ambulatory Visit | Attending: Obstetrics & Gynecology | Admitting: Obstetrics & Gynecology

## 2017-02-18 DIAGNOSIS — O26892 Other specified pregnancy related conditions, second trimester: Secondary | ICD-10-CM | POA: Insufficient documentation

## 2017-02-18 DIAGNOSIS — Z5321 Procedure and treatment not carried out due to patient leaving prior to being seen by health care provider: Secondary | ICD-10-CM | POA: Insufficient documentation

## 2017-02-18 DIAGNOSIS — Z3A14 14 weeks gestation of pregnancy: Secondary | ICD-10-CM | POA: Insufficient documentation

## 2017-02-18 DIAGNOSIS — R109 Unspecified abdominal pain: Secondary | ICD-10-CM | POA: Insufficient documentation

## 2017-02-19 ENCOUNTER — Other Ambulatory Visit: Payer: Self-pay

## 2017-02-19 ENCOUNTER — Encounter: Payer: Self-pay | Admitting: *Deleted

## 2017-02-19 ENCOUNTER — Ambulatory Visit (INDEPENDENT_AMBULATORY_CARE_PROVIDER_SITE_OTHER): Payer: Self-pay | Admitting: Certified Nurse Midwife

## 2017-02-19 ENCOUNTER — Other Ambulatory Visit (HOSPITAL_COMMUNITY)
Admission: RE | Admit: 2017-02-19 | Discharge: 2017-02-19 | Disposition: A | Discharge status: Home / Self Care | Payer: Medicaid Other | Source: Ambulatory Visit | Attending: Certified Nurse Midwife | Admitting: Certified Nurse Midwife

## 2017-02-19 ENCOUNTER — Encounter: Payer: Self-pay | Admitting: Certified Nurse Midwife

## 2017-02-19 VITALS — BP 112/71 | HR 95 | Temp 98.3°F | Wt 192.4 lb

## 2017-02-19 DIAGNOSIS — Z3A14 14 weeks gestation of pregnancy: Secondary | ICD-10-CM | POA: Diagnosis not present

## 2017-02-19 DIAGNOSIS — R109 Unspecified abdominal pain: Secondary | ICD-10-CM | POA: Diagnosis present

## 2017-02-19 DIAGNOSIS — Z5321 Procedure and treatment not carried out due to patient leaving prior to being seen by health care provider: Secondary | ICD-10-CM | POA: Diagnosis not present

## 2017-02-19 DIAGNOSIS — Z3490 Encounter for supervision of normal pregnancy, unspecified, unspecified trimester: Secondary | ICD-10-CM | POA: Insufficient documentation

## 2017-02-19 DIAGNOSIS — J45909 Unspecified asthma, uncomplicated: Secondary | ICD-10-CM | POA: Insufficient documentation

## 2017-02-19 DIAGNOSIS — Z8632 Personal history of gestational diabetes: Secondary | ICD-10-CM | POA: Insufficient documentation

## 2017-02-19 DIAGNOSIS — O219 Vomiting of pregnancy, unspecified: Secondary | ICD-10-CM

## 2017-02-19 DIAGNOSIS — E669 Obesity, unspecified: Secondary | ICD-10-CM

## 2017-02-19 DIAGNOSIS — Z3482 Encounter for supervision of other normal pregnancy, second trimester: Secondary | ICD-10-CM

## 2017-02-19 DIAGNOSIS — O26892 Other specified pregnancy related conditions, second trimester: Secondary | ICD-10-CM | POA: Diagnosis not present

## 2017-02-19 MED ORDER — PRENATE PIXIE 10-0.6-0.4-200 MG PO CAPS: 1.0000 | ORAL_CAPSULE | Freq: Every day | ORAL | 12 refills | Status: DC

## 2017-02-19 MED ORDER — DOXYLAMINE-PYRIDOXINE 10-10 MG PO TBEC: DELAYED_RELEASE_TABLET | ORAL | 4 refills | Status: DC

## 2017-02-19 NOTE — MAU Note (Signed)
Not in lobby when called x3  

## 2017-02-19 NOTE — MAU Note (Signed)
Pt here with c/o abdominal pain and spotting since this afternoon.

## 2017-02-19 NOTE — Progress Notes (Signed)
NOB, complains of NV, lower abdominal cramps and back pains x 14 days 10/10.  Pink blood yesterday. Fainted more than two times, with lightheadedness and aur[place].  Declined FLU.

## 2017-02-19 NOTE — MAU Note (Signed)
Not in lobby x1  

## 2017-02-19 NOTE — Progress Notes (Signed)
Subjective:    Erin Wilkinson is being seen today for her first obstetrical visit.  This is a planned pregnancy. She is at [redacted]w[redacted]d gestation. Her obstetrical history is significant for obesity and GDM last pregnancy. Relationship with FOB: significant other, living together. Patient does intend to breast feed. Pregnancy history fully reviewed.  The information documented in the HPI was reviewed and verified.  Menstrual History: OB History    Gravida Para Term Preterm AB Living   2 1 1     1    SAB TAB Ectopic Multiple Live Births           1       Patient's last menstrual period was 11/11/2016.    Past Medical History:  Diagnosis Date  . Asthma   . Dyspnea   . Gestational diabetes   . Gestational diabetes mellitus in pregnancy    new diagnosis 01/2011  . Headache(784.0)   . History of chronic bronchitis   . History of depression   . Syncope     Past Surgical History:  Procedure Laterality Date  . NO PAST SURGERIES       (Not in a hospital admission) No Known Allergies  Social History   Tobacco Use  . Smoking status: Never Smoker  . Smokeless tobacco: Never Used  Substance Use Topics  . Alcohol use: No    Alcohol/week: 0.0 oz    Family History  Problem Relation Age of Onset  . Asthma Mother   . Depression Mother   . Miscarriages / [place] Mother   . Cancer Mother   . Cancer Maternal Uncle   . Diabetes Maternal Grandmother   . Hypertension Maternal Grandmother   . Stroke Paternal Grandmother   . Miscarriages / Stillbirths Sister   . Heart disease Paternal Aunt   . Heart disease Paternal Uncle      Review of Systems Constitutional: negative for weight loss Gastrointestinal: negative for vomiting, + nausea Genitourinary:negative for genital lesions and vaginal discharge and dysuria Musculoskeletal:negative for back pain Behavioral/Psych: negative for abusive relationship, depression, illegal drug usage and tobacco use    Objective:    BP 112/71    Pulse 95   Temp 98.3 F (36.8 C)   Wt 192 lb 6.4 oz (87.3 kg)   LMP 11/11/2016   BMI 35.19 kg/m  General Appearance:    Alert, cooperative, no distress, appears stated age  Head:    Normocephalic, without obvious abnormality, atraumatic  Eyes:    PERRL, conjunctiva/corneas clear, EOM's intact, fundi    benign, both eyes  Ears:    Normal TM's and external ear canals, both ears  Nose:   Nares normal, septum midline, mucosa normal, no drainage    or sinus tenderness  Throat:   Lips, mucosa, and tongue normal; teeth and gums normal  Neck:   Supple, symmetrical, trachea midline, no adenopathy;    thyroid:  no enlargement/tenderness/nodules; no carotid   bruit or JVD  Back:     Symmetric, no curvature, ROM normal, no CVA tenderness  Lungs:     Clear to auscultation bilaterally, respirations unlabored  Chest Wall:    No tenderness or deformity   Heart:    Regular rate and rhythm, S1 and S2 normal, no murmur, rub   or gallop  Breast Exam:    No tenderness, masses, or nipple abnormality  Abdomen:     Soft, non-tender, bowel sounds active all four quadrants,    no masses, no organomegaly  Genitalia:  Normal female without lesion, discharge or tenderness  Extremities:   Extremities normal, atraumatic, no cyanosis or edema  Pulses:   2+ and symmetric all extremities  Skin:   Skin color, texture, turgor normal, no rashes or lesions  Lymph nodes:   Cervical, supraclavicular, and axillary nodes normal  Neurologic:   CNII-XII intact, normal strength, sensation and reflexes    throughout       Cervix: long, thick, closed and posterior. FHR: 140's by doppler.  Size c/w dates    Lab Review Urine pregnancy test Labs reviewed yes Radiologic studies reviewed yes  Assessment & Plan    Pregnancy at 242w2d weeks    1. Encounter for supervision of normal pregnancy, antepartum, unspecified gravidity    - Obstetric Panel, Including HIV - Hemoglobinopathy evaluation - Cystic Fibrosis Mutation 97 -  Culture, OB Urine - Cervicovaginal ancillary only - Varicella zoster antibody, IgG - Cytology - PAP - Hemoglobin A1c - Prenat-FeAsp-Meth-FA-DHA w/o A (PRENATE PIXIE) 10-0.6-0.4-200 MG CAPS; Take 1 tablet daily by mouth.  Dispense: 30 capsule; Refill: 12 - MaterniT21 PLUS Core+SCA  2. Obesity (BMI 30-39.9)     3. History of gestational diabetes    - Hemoglobin A1c  4. Nausea/vomiting in pregnancy     - Doxylamine-Pyridoxine (DICLEGIS) 10-10 MG TBEC; Take 1 tablet with breakfast and lunch.  Take 2 tablets at bedtime.  Dispense: 100 tablet; Refill: 4     Prenatal vitamins.  Counseling provided regarding continued use of seat belts, cessation of alcohol consumption, smoking or use of illicit drugs; infection precautions i.e., influenza/TDAP immunizations, toxoplasmosis,CMV, parvovirus, listeria and varicella; workplace safety, exercise during pregnancy; routine dental care, safe medications, sexual activity, hot tubs, saunas, pools, travel, caffeine use, fish and methlymercury, potential toxins, hair treatments, varicose veins Weight gain recommendations per IOM guidelines reviewed: underweight/BMI< 18.5--> gain 28 - 40 lbs; normal weight/BMI 18.5 - 24.9--> gain 25 - 35 lbs; overweight/BMI 25 - 29.9--> gain 15 - 25 lbs; obese/BMI >30->gain  11 - 20 lbs Problem list reviewed and updated. FIRST/CF mutation testing/NIPT/QUAD SCREEN/fragile X/Ashkenazi Jewish population testing/Spinal muscular atrophy discussed: ordered. Role of ultrasound in pregnancy discussed; fetal survey: ordered. Amniocentesis discussed: not indicated.  Meds ordered this encounter  Medications  . Doxylamine-Pyridoxine (DICLEGIS) 10-10 MG TBEC    Sig: Take 1 tablet with breakfast and lunch.  Take 2 tablets at bedtime.    Dispense:  100 tablet    Refill:  4  . Prenat-FeAsp-Meth-FA-DHA w/o A (PRENATE PIXIE) 10-0.6-0.4-200 MG CAPS    Sig: Take 1 tablet daily by mouth.    Dispense:  30 capsule    Refill:  12    Please  process coupon: Rx BIN: [id number], RxPCN: [id number], RxGRP: [id number]: [id number], Rx: [id number]: [id number]  SUF: 01   Orders Placed This Encounter  Procedures  . Culture, OB Urine  . Obstetric Panel, Including HIV  . Hemoglobinopathy evaluation  . Cystic Fibrosis Mutation 97  . Varicella zoster antibody, IgG  . Hemoglobin A1c  . MaterniT21 PLUS Core+SCA    Order Specific Question:   Is the patient insulin dependent?    Answer:   No    Order Specific Question:   Please enter gestational age. This should be expressed as weeks AND days, i.e. 16w 6d. Enter weeks here. Enter days in next question.    Answer:   7614    Order Specific Question:   Please enter gestational age. This should be expressed as weeks AND days, i.e. 16w 6d. Enter days here. Enter weeks  in previous question.    Answer:   2    Order Specific Question:   How was gestational age calculated?    Answer:   LMP    Order Specific Question:   Please give the date of LMP OR Ultrasound OR Estimated date of delivery.    Answer:   08/18/2016    Order Specific Question:   Number of Fetuses (Type of Pregnancy):    Answer:   1    Order Specific Question:   Indications for performing the test? (please choose all that apply):    Answer:   Routine screening    Order Specific Question:   Other Indications? (Y=Yes, N=No)    Answer:   N    Order Specific Question:   If this is a repeat specimen, please indicate the reason:    Answer:   Not indicated    Order Specific Question:   Please specify the patient's race: (C=White/Caucasion, B=Black, I=Native American, A=Asian, H=Hispanic, O=Other, U=Unknown)    Answer:   H    Order Specific Question:   Donor Egg - indicate if the egg was obtained from in vitro fertilization.    Answer:   N    Order Specific Question:   Age of Egg Donor.    Answer:   [age]    Order Specific Question:   Prior Down Syndrome/ONTD screening during current pregnancy.    Answer:   N    Order Specific Question:   Prior First Trimester Testing     Answer:   N    Order Specific Question:   Prior Second Trimester Testing    Answer:   N    Order Specific Question:   Family History of Neural Tube Defects    Answer:   N    Order Specific Question:   Prior Pregnancy with Down Syndrome    Answer:   N    Order Specific Question:   Please give the patient's weight (in pounds)    Answer:   193    Follow up in 4 weeks. 50% of 30 min visit spent on counseling and coordination of care.

## 2017-02-19 NOTE — MAU Note (Signed)
Not in lobby when called x2

## 2017-02-20 ENCOUNTER — Other Ambulatory Visit: Payer: Self-pay | Admitting: Certified Nurse Midwife

## 2017-02-20 ENCOUNTER — Telehealth: Payer: Self-pay

## 2017-02-20 LAB — CERVICOVAGINAL ANCILLARY ONLY
Bacterial vaginitis: NEGATIVE
CHLAMYDIA, DNA PROBE: NEGATIVE
Candida vaginitis: POSITIVE — AB
Neisseria Gonorrhea: NEGATIVE
TRICH (WINDOWPATH): NEGATIVE

## 2017-02-20 NOTE — Telephone Encounter (Signed)
Returned call, left vm to call. 

## 2017-02-21 LAB — OBSTETRIC PANEL, INCLUDING HIV
Antibody Screen: NEGATIVE
Basophils Absolute: 0 10*3/uL (ref 0.0–0.2)
Basos: 0 %
EOS (ABSOLUTE): 0.5 10*3/uL — AB (ref 0.0–0.4)
Eos: 4 %
HEMOGLOBIN: 12.4 g/dL (ref 11.1–15.9)
HIV SCREEN 4TH GENERATION: NONREACTIVE
Hematocrit: 37.1 % (ref 34.0–46.6)
Hepatitis B Surface Ag: NEGATIVE
IMMATURE GRANS (ABS): 0 10*3/uL (ref 0.0–0.1)
IMMATURE GRANULOCYTES: 0 %
LYMPHS: 21 %
Lymphocytes Absolute: 2.2 10*3/uL (ref 0.7–3.1)
MCH: 25.1 pg — ABNORMAL LOW (ref 26.6–33.0)
MCHC: 33.4 g/dL (ref 31.5–35.7)
MCV: 75 fL — ABNORMAL LOW (ref 79–97)
MONOS ABS: 0.7 10*3/uL (ref 0.1–0.9)
Monocytes: 7 %
NEUTROS PCT: 68 %
Neutrophils Absolute: 7.2 10*3/uL — ABNORMAL HIGH (ref 1.4–7.0)
Platelets: 251 10*3/uL (ref 150–379)
RBC: 4.94 x10E6/uL (ref 3.77–5.28)
RDW: 15.1 % (ref 12.3–15.4)
RPR Ser Ql: NONREACTIVE
Rh Factor: POSITIVE
Rubella Antibodies, IGG: <0.9 index — ABNORMAL LOW (ref >0.99)
WBC: 10.7 10*3/uL (ref 3.4–10.8)

## 2017-02-21 LAB — HEMOGLOBIN A1C
ESTIMATED AVERAGE GLUCOSE: 103 mg/dL
Hgb A1c MFr Bld: 5.2 % (ref 4.8–5.6)

## 2017-02-21 LAB — HEMOGLOBINOPATHY EVALUATION
HEMOGLOBIN A2 QUANTITATION: 4.4 % — AB (ref 1.8–3.2)
HEMOGLOBIN F QUANTITATION: 0 % (ref 0.0–2.0)
HGB A: 61.9 % — AB (ref 96.4–98.8)
HGB C: 0 %
HGB S: 33.7 % — AB
HGB VARIANT: 0 %

## 2017-02-21 LAB — VARICELLA ZOSTER ANTIBODY, IGG: VARICELLA: 734 {index} (ref >165)

## 2017-02-21 LAB — URINE CULTURE, OB REFLEX

## 2017-02-21 LAB — CULTURE, OB URINE

## 2017-02-23 LAB — CYTOLOGY - PAP: DIAGNOSIS: NEGATIVE

## 2017-02-24 LAB — MATERNIT21 PLUS CORE+SCA
CHROMOSOME 13: NEGATIVE
CHROMOSOME 21: NEGATIVE
Chromosome 18: NEGATIVE
Y CHROMOSOME: DETECTED

## 2017-02-26 ENCOUNTER — Other Ambulatory Visit: Payer: Self-pay | Admitting: Certified Nurse Midwife

## 2017-02-26 DIAGNOSIS — D573 Sickle-cell trait: Secondary | ICD-10-CM

## 2017-02-26 DIAGNOSIS — Z789 Other specified health status: Secondary | ICD-10-CM | POA: Insufficient documentation

## 2017-02-26 DIAGNOSIS — Z2839 Other underimmunization status: Secondary | ICD-10-CM

## 2017-02-26 DIAGNOSIS — Z3480 Encounter for supervision of other normal pregnancy, unspecified trimester: Secondary | ICD-10-CM

## 2017-02-26 DIAGNOSIS — O9989 Other specified diseases and conditions complicating pregnancy, childbirth and the puerperium: Secondary | ICD-10-CM

## 2017-02-26 DIAGNOSIS — B3731 Acute candidiasis of vulva and vagina: Secondary | ICD-10-CM

## 2017-02-26 DIAGNOSIS — Z283 Underimmunization status: Secondary | ICD-10-CM

## 2017-02-26 DIAGNOSIS — B373 Candidiasis of vulva and vagina: Secondary | ICD-10-CM

## 2017-02-26 MED ORDER — FLUCONAZOLE 150 MG PO TABS: 150.0000 mg | ORAL_TABLET | Freq: Once | ORAL | 0 refills | Status: AC

## 2017-02-26 MED ORDER — TERCONAZOLE 0.8 % VA CREA: 1.0000 | TOPICAL_CREAM | Freq: Every day | VAGINAL | 0 refills | Status: DC

## 2017-02-27 ENCOUNTER — Other Ambulatory Visit: Payer: Self-pay | Admitting: Certified Nurse Midwife

## 2017-02-27 DIAGNOSIS — Z3480 Encounter for supervision of other normal pregnancy, unspecified trimester: Secondary | ICD-10-CM

## 2017-02-27 LAB — CYSTIC FIBROSIS MUTATION 97: Interpretation: NOT DETECTED

## 2017-03-01 LAB — HEPATITIS C ANTIBODY

## 2017-03-01 LAB — SPECIMEN STATUS REPORT

## 2017-03-19 ENCOUNTER — Encounter: Payer: Medicaid Other | Admitting: Certified Nurse Midwife

## 2017-03-20 ENCOUNTER — Ambulatory Visit (INDEPENDENT_AMBULATORY_CARE_PROVIDER_SITE_OTHER): Payer: Medicaid Other | Admitting: Certified Nurse Midwife

## 2017-03-20 ENCOUNTER — Encounter: Payer: Self-pay | Admitting: Certified Nurse Midwife

## 2017-03-20 VITALS — BP 98/67 | HR 80 | Wt 195.0 lb

## 2017-03-20 DIAGNOSIS — B373 Candidiasis of vulva and vagina: Secondary | ICD-10-CM

## 2017-03-20 DIAGNOSIS — Q249 Congenital malformation of heart, unspecified: Secondary | ICD-10-CM | POA: Insufficient documentation

## 2017-03-20 DIAGNOSIS — O9989 Other specified diseases and conditions complicating pregnancy, childbirth and the puerperium: Secondary | ICD-10-CM

## 2017-03-20 DIAGNOSIS — Z2839 Other underimmunization status: Secondary | ICD-10-CM

## 2017-03-20 DIAGNOSIS — Z8632 Personal history of gestational diabetes: Secondary | ICD-10-CM

## 2017-03-20 DIAGNOSIS — Z3480 Encounter for supervision of other normal pregnancy, unspecified trimester: Secondary | ICD-10-CM

## 2017-03-20 DIAGNOSIS — Z283 Underimmunization status: Secondary | ICD-10-CM

## 2017-03-20 DIAGNOSIS — O09899 Supervision of other high risk pregnancies, unspecified trimester: Secondary | ICD-10-CM

## 2017-03-20 DIAGNOSIS — Z3482 Encounter for supervision of other normal pregnancy, second trimester: Secondary | ICD-10-CM

## 2017-03-20 DIAGNOSIS — B3731 Acute candidiasis of vulva and vagina: Secondary | ICD-10-CM

## 2017-03-20 DIAGNOSIS — Z86718 Personal history of other venous thrombosis and embolism: Secondary | ICD-10-CM

## 2017-03-20 MED ORDER — TERCONAZOLE 0.8 % VA CREA: 1.0000 | TOPICAL_CREAM | Freq: Every day | VAGINAL | 0 refills | Status: DC

## 2017-03-20 NOTE — Progress Notes (Signed)
Pt states she is having back pain, dizziness and cramping.

## 2017-03-20 NOTE — Progress Notes (Signed)
   PRENATAL VISIT NOTE  Subjective:  Erin Wilkinson is a 21 y.o. G2P1001 at 9w3dbeing seen today for ongoing prenatal care.  She is currently monitored for the following issues for this low-risk pregnancy and has Obesity (BMI 30-39.9); Abnormal uterine bleeding (AUB); Supervision of normal pregnancy, antepartum; History of gestational diabetes; Asthma; [place]; Rubella non-immune status, antepartum; and History of CVST associated with congenital heart disease on their problem list.  Patient reports no complaints.  Contractions: Irritability. Vag. Bleeding: None.   . Denies leaking of fluid.   The following portions of the patient's history were reviewed and updated as appropriate: allergies, current medications, past family history, past medical history, past social history, past surgical history and problem list. Problem list updated.  Objective:   Vitals:   03/20/17 1500  BP: 98/67  Pulse: 80  Weight: 195 lb (88.5 kg)    Fetal Status: Fetal Heart Rate (bpm): 150; doppler Fundal Height: 19 cm       General:  Alert, oriented and cooperative. Patient is in no acute distress.  Skin: Skin is warm and dry. No rash noted.   Cardiovascular: Normal heart rate noted  Respiratory: Normal respiratory effort, no problems with respiration noted  Abdomen: Soft, gravid, appropriate for gestational age.  Pain/Pressure: Present     Pelvic: Cervical exam deferred        Extremities: Normal range of motion.     Mental Status:  Normal mood and affect. Normal behavior. Normal judgment and thought content.   Assessment and Plan:  Pregnancy: G2P1001 at 116w3d1. Supervision of other normal pregnancy, antepartum     Doing well - AFP, Serum, Open Spina Bifida - [place] OB DETAIL +14 WK; Future  2. Rubella non-immune status, antepartum     MMR postpartum  3. History of gestational diabetes     A1C: normal  4. Yeast infection of the vagina     - terconazole (TERAZOL 3) 0.8 %  vaginal cream; Place 1 applicator vaginally at bedtime.  Dispense: 20 g; Refill: 0  5. History of CVST associated with congenital heart disease     - ECHO FETAL; Future  Preterm labor symptoms and general obstetric precautions including but not limited to vaginal bleeding, contractions, leaking of fluid and fetal movement were reviewed in detail with the patient. Please refer to After Visit Summary for other counseling recommendations.  Return in about 4 weeks (around 04/17/2017) for ROB.   RaMorene CrockerCNM

## 2017-03-20 NOTE — Addendum Note (Signed)
Addended by: Orvilla CornwallENNEY, Amarie Tarte A on: 03/20/2017 03:40 PM   Modules accepted: Orders

## 2017-03-21 ENCOUNTER — Encounter (HOSPITAL_COMMUNITY): Payer: Self-pay

## 2017-03-21 ENCOUNTER — Ambulatory Visit (HOSPITAL_COMMUNITY)
Admission: RE | Admit: 2017-03-21 | Discharge: 2017-03-21 | Disposition: A | Discharge status: Home / Self Care | Payer: Medicaid Other | Source: Ambulatory Visit | Attending: Certified Nurse Midwife | Admitting: Certified Nurse Midwife

## 2017-03-21 ENCOUNTER — Other Ambulatory Visit: Payer: Self-pay | Admitting: Certified Nurse Midwife

## 2017-03-21 ENCOUNTER — Other Ambulatory Visit (HOSPITAL_COMMUNITY): Payer: Self-pay | Admitting: *Deleted

## 2017-03-21 DIAGNOSIS — Z8632 Personal history of gestational diabetes: Secondary | ICD-10-CM

## 2017-03-21 DIAGNOSIS — Z3480 Encounter for supervision of other normal pregnancy, unspecified trimester: Secondary | ICD-10-CM

## 2017-03-21 DIAGNOSIS — Z3A18 18 weeks gestation of pregnancy: Secondary | ICD-10-CM | POA: Insufficient documentation

## 2017-03-21 DIAGNOSIS — Z862 Personal history of diseases of the blood and blood-forming organs and certain disorders involving the immune mechanism: Secondary | ICD-10-CM | POA: Diagnosis not present

## 2017-03-21 DIAGNOSIS — O09292 Supervision of pregnancy with other poor reproductive or obstetric history, second trimester: Secondary | ICD-10-CM | POA: Insufficient documentation

## 2017-03-21 DIAGNOSIS — O09299 Supervision of pregnancy with other poor reproductive or obstetric history, unspecified trimester: Secondary | ICD-10-CM

## 2017-03-21 DIAGNOSIS — J45909 Unspecified asthma, uncomplicated: Secondary | ICD-10-CM | POA: Insufficient documentation

## 2017-03-21 DIAGNOSIS — Z3689 Encounter for other specified antenatal screening: Secondary | ICD-10-CM | POA: Insufficient documentation

## 2017-03-21 DIAGNOSIS — O9989 Other specified diseases and conditions complicating pregnancy, childbirth and the puerperium: Secondary | ICD-10-CM | POA: Insufficient documentation

## 2017-03-21 DIAGNOSIS — O99212 Obesity complicating pregnancy, second trimester: Secondary | ICD-10-CM | POA: Diagnosis present

## 2017-03-23 LAB — AFP, SERUM, OPEN SPINA BIFIDA
AFP MOM: 1.1
AFP Value: 46 ng/mL
Gest. Age on Collection Date: 18.4 weeks
Maternal Age At EDD: 23.7 yr
OSBR RISK 1 IN: 10000
TEST RESULTS AFP: NEGATIVE
Weight: 195 [lb_av]

## 2017-03-29 ENCOUNTER — Other Ambulatory Visit: Payer: Self-pay | Admitting: Certified Nurse Midwife

## 2017-03-29 DIAGNOSIS — Z3480 Encounter for supervision of other normal pregnancy, unspecified trimester: Secondary | ICD-10-CM

## 2017-04-10 NOTE — L&D Delivery Note (Signed)
Patient is 22 y.o. G2P1001 [redacted]w[redacted]d admitted for IOL at term/LGA. S/p IOL with cytotec. SROM at 1352.  Prenatal course also complicated by obesity and anemia.  Delivery Note At 11:15 PM a viable female was delivered via Vaginal, Spontaneous (Presentation: Vertex, OA).  APGAR: 8, 9; weight pending 1hr skin to skin.   Placenta status: Intact, .  Cord: 3V with the following complications: none.  Cord pH: N/A  Anesthesia: Epidural  Episiotomy: None Lacerations: None Suture Repair: None Est. Blood Loss (mL): 50  Mom to postpartum.  Baby to Couplet care / Skin to Skin.  Caryl Ada, DO 08/15/2017, 11:28 PM OB Fellow [facility] for [facility], [facility]

## 2017-04-16 ENCOUNTER — Ambulatory Visit (HOSPITAL_COMMUNITY): Payer: Medicaid Other

## 2017-04-17 ENCOUNTER — Ambulatory Visit (HOSPITAL_COMMUNITY)
Admission: RE | Admit: 2017-04-17 | Discharge: 2017-04-17 | Disposition: A | Discharge status: Home / Self Care | Payer: Medicaid Other | Source: Ambulatory Visit | Attending: Certified Nurse Midwife | Admitting: Certified Nurse Midwife

## 2017-04-17 ENCOUNTER — Other Ambulatory Visit (HOSPITAL_COMMUNITY): Payer: Self-pay | Admitting: *Deleted

## 2017-04-17 ENCOUNTER — Encounter (HOSPITAL_COMMUNITY): Payer: Self-pay

## 2017-04-17 ENCOUNTER — Other Ambulatory Visit (HOSPITAL_COMMUNITY): Payer: Self-pay | Admitting: Obstetrics and Gynecology

## 2017-04-17 ENCOUNTER — Other Ambulatory Visit (HOSPITAL_COMMUNITY)
Admission: RE | Admit: 2017-04-17 | Discharge: 2017-04-17 | Disposition: A | Discharge status: Home / Self Care | Payer: Medicaid Other | Source: Ambulatory Visit | Attending: Certified Nurse Midwife | Admitting: Certified Nurse Midwife

## 2017-04-17 ENCOUNTER — Ambulatory Visit (INDEPENDENT_AMBULATORY_CARE_PROVIDER_SITE_OTHER): Payer: Medicaid Other | Admitting: Certified Nurse Midwife

## 2017-04-17 VITALS — BP 111/70 | HR 86 | Wt 202.2 lb

## 2017-04-17 DIAGNOSIS — O99212 Obesity complicating pregnancy, second trimester: Secondary | ICD-10-CM

## 2017-04-17 DIAGNOSIS — IMO0002 Reserved for concepts with insufficient information to code with codable children: Secondary | ICD-10-CM

## 2017-04-17 DIAGNOSIS — Z3A22 22 weeks gestation of pregnancy: Secondary | ICD-10-CM | POA: Insufficient documentation

## 2017-04-17 DIAGNOSIS — Z2839 Other underimmunization status: Secondary | ICD-10-CM

## 2017-04-17 DIAGNOSIS — Z8632 Personal history of gestational diabetes: Secondary | ICD-10-CM

## 2017-04-17 DIAGNOSIS — Z3480 Encounter for supervision of other normal pregnancy, unspecified trimester: Secondary | ICD-10-CM | POA: Diagnosis present

## 2017-04-17 DIAGNOSIS — O99519 Diseases of the respiratory system complicating pregnancy, unspecified trimester: Secondary | ICD-10-CM

## 2017-04-17 DIAGNOSIS — E669 Obesity, unspecified: Secondary | ICD-10-CM

## 2017-04-17 DIAGNOSIS — O09299 Supervision of pregnancy with other poor reproductive or obstetric history, unspecified trimester: Secondary | ICD-10-CM

## 2017-04-17 DIAGNOSIS — J45909 Unspecified asthma, uncomplicated: Secondary | ICD-10-CM

## 2017-04-17 DIAGNOSIS — Z0489 Encounter for examination and observation for other specified reasons: Secondary | ICD-10-CM

## 2017-04-17 DIAGNOSIS — Z362 Encounter for other antenatal screening follow-up: Secondary | ICD-10-CM | POA: Diagnosis present

## 2017-04-17 DIAGNOSIS — Z3482 Encounter for supervision of other normal pregnancy, second trimester: Secondary | ICD-10-CM

## 2017-04-17 DIAGNOSIS — Z283 Underimmunization status: Secondary | ICD-10-CM

## 2017-04-17 DIAGNOSIS — N898 Other specified noninflammatory disorders of vagina: Secondary | ICD-10-CM

## 2017-04-17 DIAGNOSIS — O09292 Supervision of pregnancy with other poor reproductive or obstetric history, second trimester: Secondary | ICD-10-CM

## 2017-04-17 DIAGNOSIS — O99213 Obesity complicating pregnancy, third trimester: Secondary | ICD-10-CM

## 2017-04-17 DIAGNOSIS — O9989 Other specified diseases and conditions complicating pregnancy, childbirth and the puerperium: Secondary | ICD-10-CM

## 2017-04-17 MED ORDER — TERCONAZOLE 0.8 % VA CREA: 1.0000 | TOPICAL_CREAM | Freq: Every day | VAGINAL | 0 refills | Status: DC

## 2017-04-17 MED ORDER — FLUCONAZOLE 150 MG PO TABS: 150.0000 mg | ORAL_TABLET | Freq: Once | ORAL | 0 refills | Status: AC

## 2017-04-17 NOTE — Progress Notes (Signed)
Patient reports good fetal movement and complains of occasional back pain but none today. Pt also complains of recurrent yeast infections.

## 2017-04-17 NOTE — Patient Instructions (Signed)
Thinking About Waterbirth???  You must attend a Waterbirth class at [facility]  3rd Wednesday of every month from 7-9pm  Free  Register by calling [phone] or online at [email or web address]  Bring us the certificate from the class  Waterbirth supplies needed for [facility] Department patients:  Yourwaterbirth[employer] sells tubs for ~ $120 if you would rather purchase your own tub.  They also sell accessories, liners.    Www.waterbirthsolutions.com for tub purchases and supplies  [employer] (www.thelaborladies.com) $275 for tub rental/set-up & take down/kit   [employer] information regarding doulas (labor support) who provide pool rentals:  Http://www.padanc.org/MeetUs.htm   [employer] (www.thelaborladies.com)  Http://www.padanc.org/MeetUs.htm   Things that would prevent you from having a waterbirth:  Premature, <37wks  Previous cesarean birth  Presence of thick meconium-stained fluid  Multiple gestation (Twins, triplets, etc.)  Uncontrolled diabetes or gestational diabetes requiring medication  Hypertension  Heavy vaginal bleeding  Non-reassuring fetal heart rate  Active infection (MRSA, etc.)  If your labor has to be induced and induction method requires continuous monitoring of the baby's heart rate  Other risks/issues identified by your obstetrical provider   

## 2017-04-17 NOTE — Progress Notes (Signed)
   PRENATAL VISIT NOTE  Subjective:  Erin Wilkinson is a 22 y.o. G2P1001 at 88w3dbeing seen today for ongoing prenatal care.  She is currently monitored for the following issues for this low-risk pregnancy and has Obesity (BMI 30-39.9); Abnormal uterine bleeding (AUB); Supervision of normal pregnancy, antepartum; History of gestational diabetes; Asthma; Sickle cell trait ([place]; Rubella non-immune status, antepartum; and History of CVST associated with congenital heart disease on their problem list.  Patient reports no bleeding, no contractions, no cramping, no leaking and vaginal irritation.  Contractions: Not present. Vag. Bleeding: None.  Movement: Present. Denies leaking of fluid.   The following portions of the patient's history were reviewed and updated as appropriate: allergies, current medications, past family history, past medical history, past social history, past surgical history and problem list. Problem list updated.  Objective:   Vitals:   04/17/17 1347  BP: 111/70  Pulse: 86  Weight: 202 lb 3.2 oz (91.7 kg)    Fetal Status: Fetal Heart Rate (bpm): 135; doppler Fundal Height: 22 cm Movement: Present     General:  Alert, oriented and cooperative. Patient is in no acute distress.  Skin: Skin is warm and dry. No rash noted.   Cardiovascular: Normal heart rate noted  Respiratory: Normal respiratory effort, no problems with respiration noted  Abdomen: Soft, gravid, appropriate for gestational age.  Pain/Pressure: Absent     Pelvic: Cervical exam deferred        Extremities: Normal range of motion.  Edema: None  Mental Status:  Normal mood and affect. Normal behavior. Normal judgment and thought content.   Assessment and Plan:  Pregnancy: G2P1001 at 258w3d1. Supervision of other normal pregnancy, antepartum     - Cervicovaginal ancillary only - [place] OB FOLLOW UP; Future  2. Rubella non-immune status, antepartum     MMR postpartum  3. Obesity (BMI 30-39.9)    4  lb weight gain this pregnancy  4. History of gestational diabetes     Normal A1C  5. Vaginal discharge     OTC probiotics.   - fluconazole (DIFLUCAN) 150 MG tablet; Take 1 tablet (150 mg total) by mouth once for 1 dose.  Dispense: 1 tablet; Refill: 0 - terconazole (TERAZOL 3) 0.8 % vaginal cream; Place 1 applicator vaginally at bedtime.  Dispense: 20 g; Refill: 0  Preterm labor symptoms and general obstetric precautions including but not limited to vaginal bleeding, contractions, leaking of fluid and fetal movement were reviewed in detail with the patient. Please refer to After Visit Summary for other counseling recommendations.  Return in about 4 weeks (around 05/15/2017) for ROB.   RaMorene CrockerCNM

## 2017-04-17 NOTE — Addendum Note (Signed)
Encounter addended by: Drue NovelKeatts, Deagen Krass J, RDMS on: 04/17/2017 12:52 PM  Actions taken: Imaging Exam ended

## 2017-04-18 LAB — CERVICOVAGINAL ANCILLARY ONLY
Bacterial vaginitis: NEGATIVE
Candida vaginitis: POSITIVE — AB
Chlamydia: NEGATIVE
NEISSERIA GONORRHEA: NEGATIVE
Trichomonas: NEGATIVE

## 2017-04-19 ENCOUNTER — Other Ambulatory Visit: Payer: Self-pay | Admitting: Certified Nurse Midwife

## 2017-04-23 ENCOUNTER — Encounter (HOSPITAL_COMMUNITY): Payer: Self-pay

## 2017-04-23 DIAGNOSIS — O09292 Supervision of pregnancy with other poor reproductive or obstetric history, second trimester: Secondary | ICD-10-CM | POA: Insufficient documentation

## 2017-05-05 ENCOUNTER — Other Ambulatory Visit: Payer: Self-pay | Admitting: Certified Nurse Midwife

## 2017-05-05 DIAGNOSIS — N898 Other specified noninflammatory disorders of vagina: Secondary | ICD-10-CM

## 2017-05-07 MED ORDER — TERCONAZOLE 0.8 % VA CREA: 1.0000 | TOPICAL_CREAM | Freq: Every day | VAGINAL | 0 refills | Status: DC

## 2017-05-07 NOTE — Telephone Encounter (Signed)
Duplicate

## 2017-05-18 ENCOUNTER — Encounter: Payer: Medicaid Other | Admitting: Certified Nurse Midwife

## 2017-05-29 ENCOUNTER — Encounter: Payer: Medicaid Other | Admitting: Certified Nurse Midwife

## 2017-05-31 ENCOUNTER — Ambulatory Visit (INDEPENDENT_AMBULATORY_CARE_PROVIDER_SITE_OTHER): Payer: Medicaid Other | Admitting: Certified Nurse Midwife

## 2017-05-31 ENCOUNTER — Encounter: Payer: Self-pay | Admitting: Certified Nurse Midwife

## 2017-05-31 VITALS — BP 106/70 | HR 94 | Wt 215.4 lb

## 2017-05-31 DIAGNOSIS — Z2839 Other underimmunization status: Secondary | ICD-10-CM

## 2017-05-31 DIAGNOSIS — O9989 Other specified diseases and conditions complicating pregnancy, childbirth and the puerperium: Secondary | ICD-10-CM

## 2017-05-31 DIAGNOSIS — Z3480 Encounter for supervision of other normal pregnancy, unspecified trimester: Secondary | ICD-10-CM

## 2017-05-31 DIAGNOSIS — O26893 Other specified pregnancy related conditions, third trimester: Secondary | ICD-10-CM

## 2017-05-31 DIAGNOSIS — Z91199 Patient's noncompliance with other medical treatment and regimen due to unspecified reason: Secondary | ICD-10-CM

## 2017-05-31 DIAGNOSIS — Z3483 Encounter for supervision of other normal pregnancy, third trimester: Secondary | ICD-10-CM

## 2017-05-31 DIAGNOSIS — Z283 Underimmunization status: Secondary | ICD-10-CM

## 2017-05-31 DIAGNOSIS — Z9119 Patient's noncompliance with other medical treatment and regimen: Secondary | ICD-10-CM

## 2017-05-31 DIAGNOSIS — Z8632 Personal history of gestational diabetes: Secondary | ICD-10-CM

## 2017-05-31 DIAGNOSIS — M545 Low back pain, unspecified: Secondary | ICD-10-CM

## 2017-05-31 DIAGNOSIS — E669 Obesity, unspecified: Secondary | ICD-10-CM

## 2017-05-31 DIAGNOSIS — O09899 Supervision of other high risk pregnancies, unspecified trimester: Secondary | ICD-10-CM

## 2017-05-31 LAB — GLUCOSE, POCT (MANUAL RESULT ENTRY): POC Glucose: 91 mg/dl (ref 70–99)

## 2017-05-31 MED ORDER — GLUCOSE BLOOD VI STRP: ORAL_STRIP | 12 refills | Status: DC

## 2017-05-31 MED ORDER — COMFORT FIT MATERNITY SUPP LG MISC
1.0000 [IU] | Freq: Every day | 0 refills | Status: DC
Start: 2017-05-31 — End: 2017-08-15

## 2017-05-31 MED ORDER — ACCU-CHEK FASTCLIX LANCET KIT: 1.0000 "application " | PACK | 12 refills | Status: DC

## 2017-05-31 NOTE — Progress Notes (Signed)
   PRENATAL VISIT NOTE  Subjective:  Erin Wilkinson is a 22 y.o. G2P1001 at 37w5dbeing seen today for ongoing prenatal care.  She is currently monitored for the following issues for this low-risk pregnancy and has Obesity (BMI 30-39.9); Abnormal uterine bleeding (AUB); Supervision of normal pregnancy, antepartum; History of gestational diabetes; Asthma; Sickle cell trait ([place]; Rubella non-immune status, antepartum; and History of CVST associated with congenital heart disease on their problem list.  Patient reports no complaints.  Contractions: Irregular. Vag. Bleeding: None.  Movement: Present. Denies leaking of fluid.   The following portions of the patient's history were reviewed and updated as appropriate: allergies, current medications, past family history, past medical history, past social history, past surgical history and problem list. Problem list updated.  Objective:   Vitals:   05/31/17 1456  BP: 106/70  Pulse: 94  Weight: 215 lb 6.4 oz (97.7 kg)    Fetal Status: Fetal Heart Rate (bpm): 146; doppler Fundal Height: 28 cm Movement: Present     General:  Alert, oriented and cooperative. Patient is in no acute distress.  Skin: Skin is warm and dry. No rash noted.   Cardiovascular: Normal heart rate noted  Respiratory: Normal respiratory effort, no problems with respiration noted  Abdomen: Soft, gravid, appropriate for gestational age.  Pain/Pressure: Present     Pelvic: Cervical exam deferred        Extremities: Normal range of motion.  Edema: Trace  Mental Status:  Normal mood and affect. Normal behavior. Normal judgment and thought content.   Assessment and Plan:  Pregnancy: G2P1001 at 288w5d1. Supervision of other normal pregnancy, antepartum      28 week labs. 2 hour OGTT/OGTT testing declined.  Discussed A1C and 2 weeks of CBG monitoring patient agreeable to that plan.  Education on meter given.  - CBC - HIV antibody - RPR  2. Medical non-compliance      -  Hemoglobin A1c  3. History of gestational diabetes     - Referral to Nutrition and Diabetes Services - Lancets Misc. (ACCU-CHEK FASTCLIX LANCET) KIT; 1 application by Does not apply route as directed.  Dispense: 1 kit; Refill: 12 - glucose blood (ACCU-CHEK GUIDE) test strip; Use as instructed  Dispense: 100 each; Refill: 12 - POCT Glucose (CBG)  4. Rubella non-immune status, antepartum    MMR postpartum.   5. Obesity (BMI 30-39.9)     17 lb weight gain this pregnancy  6. Low back pain during pregnancy in third trimester    Homeopathic remedies discussed.  - Elastic Bandages & Supports (COMFORT FIT MATERNITY SUPP LG) MISC; 1 Units by Does not apply route daily.  Dispense: 1 each; Refill: 0  Preterm labor symptoms and general obstetric precautions including but not limited to vaginal bleeding, contractions, leaking of fluid and fetal movement were reviewed in detail with the patient. Please refer to After Visit Summary for other counseling recommendations.  Return in about 2 weeks (around 06/14/2017) for Erin Wilkinson

## 2017-06-01 ENCOUNTER — Other Ambulatory Visit: Payer: Self-pay | Admitting: Certified Nurse Midwife

## 2017-06-01 DIAGNOSIS — Z3480 Encounter for supervision of other normal pregnancy, unspecified trimester: Secondary | ICD-10-CM

## 2017-06-01 DIAGNOSIS — O99013 Anemia complicating pregnancy, third trimester: Secondary | ICD-10-CM

## 2017-06-01 DIAGNOSIS — O99019 Anemia complicating pregnancy, unspecified trimester: Secondary | ICD-10-CM | POA: Insufficient documentation

## 2017-06-01 LAB — CBC
HEMATOCRIT: 31.6 % — AB (ref 34.0–46.6)
HEMOGLOBIN: 10.5 g/dL — AB (ref 11.1–15.9)
MCH: 24.5 pg — AB (ref 26.6–33.0)
MCHC: 33.2 g/dL (ref 31.5–35.7)
MCV: 74 fL — ABNORMAL LOW (ref 79–97)
Platelets: 268 10*3/uL (ref 150–379)
RBC: 4.28 x10E6/uL (ref 3.77–5.28)
RDW: 13.9 % (ref 12.3–15.4)
WBC: 12.2 10*3/uL — ABNORMAL HIGH (ref 3.4–10.8)

## 2017-06-01 LAB — RPR: RPR: NONREACTIVE

## 2017-06-01 LAB — HEMOGLOBIN A1C
ESTIMATED AVERAGE GLUCOSE: 100 mg/dL
HEMOGLOBIN A1C: 5.1 % (ref 4.8–5.6)

## 2017-06-01 LAB — HIV ANTIBODY (ROUTINE TESTING W REFLEX): HIV SCREEN 4TH GENERATION: NONREACTIVE

## 2017-06-01 MED ORDER — CITRANATAL BLOOM 90-1 MG PO TABS: 1.0000 | ORAL_TABLET | Freq: Every day | ORAL | 12 refills | Status: DC

## 2017-06-04 ENCOUNTER — Encounter (HOSPITAL_COMMUNITY): Payer: Self-pay

## 2017-06-04 ENCOUNTER — Ambulatory Visit (HOSPITAL_COMMUNITY)
Admission: RE | Admit: 2017-06-04 | Discharge: 2017-06-04 | Disposition: A | Discharge status: Home / Self Care | Payer: Medicaid Other | Source: Ambulatory Visit | Attending: Certified Nurse Midwife | Admitting: Certified Nurse Midwife

## 2017-06-04 ENCOUNTER — Other Ambulatory Visit: Payer: Self-pay | Admitting: Certified Nurse Midwife

## 2017-06-04 DIAGNOSIS — Z3A29 29 weeks gestation of pregnancy: Secondary | ICD-10-CM | POA: Diagnosis not present

## 2017-06-04 DIAGNOSIS — D573 Sickle-cell trait: Secondary | ICD-10-CM | POA: Insufficient documentation

## 2017-06-04 DIAGNOSIS — Z362 Encounter for other antenatal screening follow-up: Secondary | ICD-10-CM | POA: Diagnosis not present

## 2017-06-04 DIAGNOSIS — O09299 Supervision of pregnancy with other poor reproductive or obstetric history, unspecified trimester: Secondary | ICD-10-CM | POA: Diagnosis not present

## 2017-06-04 DIAGNOSIS — O99213 Obesity complicating pregnancy, third trimester: Secondary | ICD-10-CM

## 2017-06-04 DIAGNOSIS — O99513 Diseases of the respiratory system complicating pregnancy, third trimester: Secondary | ICD-10-CM | POA: Diagnosis not present

## 2017-06-04 DIAGNOSIS — J45909 Unspecified asthma, uncomplicated: Secondary | ICD-10-CM | POA: Diagnosis not present

## 2017-06-04 DIAGNOSIS — Z8632 Personal history of gestational diabetes: Secondary | ICD-10-CM | POA: Diagnosis not present

## 2017-06-04 DIAGNOSIS — Z3480 Encounter for supervision of other normal pregnancy, unspecified trimester: Secondary | ICD-10-CM

## 2017-06-05 ENCOUNTER — Other Ambulatory Visit: Payer: Self-pay | Admitting: Certified Nurse Midwife

## 2017-06-05 DIAGNOSIS — Z3480 Encounter for supervision of other normal pregnancy, unspecified trimester: Secondary | ICD-10-CM

## 2017-06-14 ENCOUNTER — Encounter: Payer: Self-pay | Admitting: Certified Nurse Midwife

## 2017-06-14 ENCOUNTER — Ambulatory Visit (INDEPENDENT_AMBULATORY_CARE_PROVIDER_SITE_OTHER): Payer: Medicaid Other | Admitting: Certified Nurse Midwife

## 2017-06-14 VITALS — Wt 220.0 lb

## 2017-06-14 DIAGNOSIS — Z283 Underimmunization status: Secondary | ICD-10-CM

## 2017-06-14 DIAGNOSIS — O99013 Anemia complicating pregnancy, third trimester: Secondary | ICD-10-CM

## 2017-06-14 DIAGNOSIS — Z2839 Other underimmunization status: Secondary | ICD-10-CM

## 2017-06-14 DIAGNOSIS — O9989 Other specified diseases and conditions complicating pregnancy, childbirth and the puerperium: Secondary | ICD-10-CM

## 2017-06-14 DIAGNOSIS — Z3480 Encounter for supervision of other normal pregnancy, unspecified trimester: Secondary | ICD-10-CM

## 2017-06-14 NOTE — Progress Notes (Signed)
   PRENATAL VISIT NOTE  Subjective:  Erin Wilkinson is a 22 y.o. G2P1001 at 58w5dbeing seen today for ongoing prenatal care.  She is currently monitored for the following issues for this low-risk pregnancy and has Obesity (BMI 30-39.9); Abnormal uterine bleeding (AUB); Supervision of normal pregnancy, antepartum; History of gestational diabetes; Asthma; Sickle cell trait ([place]; Rubella non-immune status, antepartum; History of CVST associated with congenital heart disease; and Anemia of mother in pregnancy on their problem list.  Patient reports no complaints.  Contractions: Not present. Vag. Bleeding: None.  Movement: Present. Denies leaking of fluid.   The following portions of the patient's history were reviewed and updated as appropriate: allergies, current medications, past family history, past medical history, past social history, past surgical history and problem list. Problem list updated.  Objective:   Vitals:   06/14/17 1413  Weight: 220 lb (99.8 kg)    Fetal Status: Fetal Heart Rate (bpm): 140; doppler Fundal Height: 31 cm Movement: Present     General:  Alert, oriented and cooperative. Patient is in no acute distress.  Skin: Skin is warm and dry. No rash noted.   Cardiovascular: Normal heart rate noted  Respiratory: Normal respiratory effort, no problems with respiration noted  Abdomen: Soft, gravid, appropriate for gestational age.  Pain/Pressure: Present     Pelvic: Cervical exam deferred        Extremities: Normal range of motion.  Edema: Trace  Mental Status:  Normal mood and affect. Normal behavior. Normal judgment and thought content.   Assessment and Plan:  Pregnancy: G2P1001 at 375w5d1. Supervision of other normal pregnancy, antepartum     Did not bring blood sugar logs.  Discussed bringing them the next visit.  Waterbirth discussed, information given.   2. Rubella non-immune status, antepartum     MMR postpartum  3. Anemia during pregnancy in third  trimester     Bloom sent to the pharmacy previously.    Preterm labor symptoms and general obstetric precautions including but not limited to vaginal bleeding, contractions, leaking of fluid and fetal movement were reviewed in detail with the patient. Please refer to After Visit Summary for other counseling recommendations.  Return in about 2 weeks (around 06/28/2017) for ROB.   RaMorene CrockerCNM

## 2017-06-15 ENCOUNTER — Encounter: Payer: Self-pay | Admitting: *Deleted

## 2017-06-29 ENCOUNTER — Encounter: Payer: Medicaid Other | Admitting: Certified Nurse Midwife

## 2017-07-05 ENCOUNTER — Ambulatory Visit (INDEPENDENT_AMBULATORY_CARE_PROVIDER_SITE_OTHER): Payer: Medicaid Other | Admitting: Certified Nurse Midwife

## 2017-07-05 ENCOUNTER — Encounter: Payer: Self-pay | Admitting: Certified Nurse Midwife

## 2017-07-05 ENCOUNTER — Encounter: Payer: Self-pay | Admitting: *Deleted

## 2017-07-05 ENCOUNTER — Other Ambulatory Visit: Payer: Self-pay

## 2017-07-05 ENCOUNTER — Other Ambulatory Visit (HOSPITAL_COMMUNITY)
Admission: RE | Admit: 2017-07-05 | Discharge: 2017-07-05 | Disposition: A | Discharge status: Home / Self Care | Payer: Medicaid Other | Source: Ambulatory Visit | Attending: Certified Nurse Midwife | Admitting: Certified Nurse Midwife

## 2017-07-05 VITALS — BP 111/69 | HR 94 | Wt 227.0 lb

## 2017-07-05 DIAGNOSIS — O9989 Other specified diseases and conditions complicating pregnancy, childbirth and the puerperium: Secondary | ICD-10-CM

## 2017-07-05 DIAGNOSIS — B373 Candidiasis of vulva and vagina: Secondary | ICD-10-CM | POA: Insufficient documentation

## 2017-07-05 DIAGNOSIS — Z283 Underimmunization status: Secondary | ICD-10-CM

## 2017-07-05 DIAGNOSIS — B9689 Other specified bacterial agents as the cause of diseases classified elsewhere: Secondary | ICD-10-CM | POA: Insufficient documentation

## 2017-07-05 DIAGNOSIS — N898 Other specified noninflammatory disorders of vagina: Secondary | ICD-10-CM | POA: Diagnosis not present

## 2017-07-05 DIAGNOSIS — O09899 Supervision of other high risk pregnancies, unspecified trimester: Secondary | ICD-10-CM

## 2017-07-05 DIAGNOSIS — Z2839 Other underimmunization status: Secondary | ICD-10-CM

## 2017-07-05 DIAGNOSIS — Z3480 Encounter for supervision of other normal pregnancy, unspecified trimester: Secondary | ICD-10-CM | POA: Insufficient documentation

## 2017-07-05 DIAGNOSIS — Z8632 Personal history of gestational diabetes: Secondary | ICD-10-CM

## 2017-07-05 MED ORDER — TERCONAZOLE 0.8 % VA CREA: 1.0000 | TOPICAL_CREAM | Freq: Every day | VAGINAL | 0 refills | Status: DC

## 2017-07-05 MED ORDER — FLUCONAZOLE 150 MG PO TABS: 150.0000 mg | ORAL_TABLET | Freq: Once | ORAL | 0 refills | Status: AC

## 2017-07-05 NOTE — Progress Notes (Signed)
   PRENATAL VISIT NOTE  Subjective:  Erin Wilkinson is a 22 y.o. G2P1001 at 38w5dbeing seen today for ongoing prenatal care.  She is currently monitored for the following issues for this high-risk pregnancy and has Obesity (BMI 30-39.9); Abnormal uterine bleeding (AUB); Supervision of normal pregnancy, antepartum; History of gestational diabetes; Asthma; [place] Rubella non-immune status, antepartum; History of CVST associated with congenital heart disease; and Anemia of mother in pregnancy on their problem list.  Patient reports no bleeding, no leaking, occasional contractions and vaginal irritation.  Contractions: Not present. Vag. Bleeding: None.  Movement: Present. Denies leaking of fluid.   The following portions of the patient's history were reviewed and updated as appropriate: allergies, current medications, past family history, past medical history, past social history, past surgical history and problem list. Problem list updated.  Objective:   Vitals:   07/05/17 1055  BP: 111/69  Pulse: 94  Weight: 227 lb (103 kg)    Fetal Status: Fetal Heart Rate (bpm): 143; doppler Fundal Height: 35 cm Movement: Present  Presentation: Vertex  General:  Alert, oriented and cooperative. Patient is in no acute distress.  Skin: Skin is warm and dry. No rash noted.   Cardiovascular: Normal heart rate noted  Respiratory: Normal respiratory effort, no problems with respiration noted  Abdomen: Soft, gravid, appropriate for gestational age.  Pain/Pressure: Present     Pelvic: Cervical exam performed Dilation: Closed Effacement (%): Thick Station: Ballotable, outer os 1 cm  Extremities: Normal range of motion.  Edema: Trace  Mental Status:  Normal mood and affect. Normal behavior. Normal judgment and thought content.   Assessment and Plan:  Pregnancy: G2P1001 at 366w5d1. Supervision of other normal pregnancy, antepartum      Non-compliant with checking her blood sugars after  refusing blood sugar screening, highly suspicious of GDM this pregnancy due to her history - Cervicovaginal ancillary only - [place] OB FOLLOW [place]; Future  2. Rubella non-immune status, antepartum      MMR postpartum  3. History of gestational diabetes     Needs to bring blood sugar logs to next visit - [place] OB FOLLOW [place]; Future  4. Vaginal discharge     + yeast seen - Cervicovaginal ancillary only - fluconazole (DIFLUCAN) 150 MG tablet; Take 1 tablet (150 mg total) by mouth once for 1 dose.  Dispense: 1 tablet; Refill: 0 - terconazole (TERAZOL 3) 0.8 % vaginal cream; Place 1 applicator vaginally at bedtime.  Dispense: 20 g; Refill: 0  Preterm labor symptoms and general obstetric precautions including but not limited to vaginal bleeding, contractions, leaking of fluid and fetal movement were reviewed in detail with the patient. Please refer to After Visit Summary for other counseling recommendations.  Return in about 1 week (around 07/12/2017) for Needs to see FP MD here.d/t r/o GDM.     RaMorene CrockerCNM

## 2017-07-05 NOTE — Progress Notes (Signed)
C/o yellow, malodorous discharge x 1 week and lower abdominal pain 8/10.

## 2017-07-06 LAB — CERVICOVAGINAL ANCILLARY ONLY
BACTERIAL VAGINITIS: POSITIVE — AB
CANDIDA VAGINITIS: POSITIVE — AB
CHLAMYDIA, DNA PROBE: NEGATIVE
Neisseria Gonorrhea: NEGATIVE
Trichomonas: NEGATIVE

## 2017-07-12 ENCOUNTER — Other Ambulatory Visit: Payer: Self-pay | Admitting: Certified Nurse Midwife

## 2017-07-12 ENCOUNTER — Encounter: Payer: Medicaid Other | Admitting: Certified Nurse Midwife

## 2017-07-12 ENCOUNTER — Encounter: Payer: Self-pay | Admitting: Obstetrics and Gynecology

## 2017-07-12 ENCOUNTER — Ambulatory Visit (INDEPENDENT_AMBULATORY_CARE_PROVIDER_SITE_OTHER): Payer: Medicaid Other | Admitting: Obstetrics and Gynecology

## 2017-07-12 VITALS — BP 108/72 | HR 89 | Wt 228.0 lb

## 2017-07-12 DIAGNOSIS — B9689 Other specified bacterial agents as the cause of diseases classified elsewhere: Secondary | ICD-10-CM

## 2017-07-12 DIAGNOSIS — Z8632 Personal history of gestational diabetes: Secondary | ICD-10-CM

## 2017-07-12 DIAGNOSIS — N760 Acute vaginitis: Secondary | ICD-10-CM

## 2017-07-12 DIAGNOSIS — B373 Candidiasis of vulva and vagina: Secondary | ICD-10-CM

## 2017-07-12 DIAGNOSIS — B3731 Acute candidiasis of vulva and vagina: Secondary | ICD-10-CM

## 2017-07-12 DIAGNOSIS — Z3480 Encounter for supervision of other normal pregnancy, unspecified trimester: Secondary | ICD-10-CM

## 2017-07-12 DIAGNOSIS — N898 Other specified noninflammatory disorders of vagina: Secondary | ICD-10-CM

## 2017-07-12 MED ORDER — SECNIDAZOLE 2 G PO PACK: 1.0000 | PACK | Freq: Once | ORAL | 0 refills | Status: AC

## 2017-07-12 MED ORDER — FLUCONAZOLE 150 MG PO TABS: 150.0000 mg | ORAL_TABLET | Freq: Once | ORAL | 0 refills | Status: AC

## 2017-07-12 NOTE — Progress Notes (Signed)
   PRENATAL VISIT NOTE  Subjective:  Erin Wilkinson is a 22 y.o. G2P1001 at 4719w5d being seen today for ongoing prenatal care.  She is currently monitored for the following issues for this high-risk pregnancy and has Obesity (BMI 30-39.9); Abnormal uterine bleeding (AUB); Supervision of normal pregnancy, antepartum; History of gestational diabetes; Asthma; Sickle cell trait (HCC); Rubella non-immune status, antepartum; History of CVST associated with congenital heart disease; and Anemia of mother in pregnancy on their problem list.  Patient reports no complaints.  Contractions: Irregular. Vag. Bleeding: None.  Movement: Present. Denies leaking of fluid.   The following portions of the patient's history were reviewed and updated as appropriate: allergies, current medications, past family history, past medical history, past social history, past surgical history and problem list. Problem list updated.  Objective:   Vitals:   07/12/17 1632  BP: 108/72  Pulse: 89  Weight: 228 lb (103.4 kg)    Fetal Status: Fetal Heart Rate (bpm): 135 Fundal Height: 36 cm Movement: Present     General:  Alert, oriented and cooperative. Patient is in no acute distress.  Skin: Skin is warm and dry. No rash noted.   Cardiovascular: Normal heart rate noted  Respiratory: Normal respiratory effort, no problems with respiration noted  Abdomen: Soft, gravid, appropriate for gestational age.  Pain/Pressure: Present     Pelvic: Cervical exam deferred        Extremities: Normal range of motion.     Mental Status: Normal mood and affect. Normal behavior. Normal judgment and thought content.   Assessment and Plan:  Pregnancy: G2P1001 at 919w5d  1. Supervision of other normal pregnancy, antepartum Patient is doing well without complaints Cultures next visit   2. History of gestational diabetes Patient did not bring CBG log Discussed with patient that having GDM and not knowing her GDM status (given her history)  will disqualify her from having a waterbirth Patient agrees to bring CBG log next visit  Preterm labor symptoms and general obstetric precautions including but not limited to vaginal bleeding, contractions, leaking of fluid and fetal movement were reviewed in detail with the patient. Please refer to After Visit Summary for other counseling recommendations.  Return in about 2 weeks (around 07/26/2017) for ROB.  Future Appointments  Date Time Provider Department Center  07/13/2017  4:00 PM [facility] US 3 [facility]    Catalina AntiguaPeggy Jatoya Armbrister, MD

## 2017-07-13 ENCOUNTER — Encounter (HOSPITAL_COMMUNITY): Payer: Self-pay

## 2017-07-13 ENCOUNTER — Ambulatory Visit (HOSPITAL_COMMUNITY)
Admission: RE | Admit: 2017-07-13 | Discharge: 2017-07-13 | Disposition: A | Discharge status: Home / Self Care | Payer: Medicaid Other | Source: Ambulatory Visit | Attending: Certified Nurse Midwife | Admitting: Certified Nurse Midwife

## 2017-07-13 DIAGNOSIS — J45909 Unspecified asthma, uncomplicated: Secondary | ICD-10-CM | POA: Insufficient documentation

## 2017-07-13 DIAGNOSIS — Z3480 Encounter for supervision of other normal pregnancy, unspecified trimester: Secondary | ICD-10-CM

## 2017-07-13 DIAGNOSIS — Z862 Personal history of diseases of the blood and blood-forming organs and certain disorders involving the immune mechanism: Secondary | ICD-10-CM | POA: Insufficient documentation

## 2017-07-13 DIAGNOSIS — O99513 Diseases of the respiratory system complicating pregnancy, third trimester: Secondary | ICD-10-CM | POA: Diagnosis not present

## 2017-07-13 DIAGNOSIS — O99213 Obesity complicating pregnancy, third trimester: Secondary | ICD-10-CM | POA: Diagnosis present

## 2017-07-13 DIAGNOSIS — Z3A34 34 weeks gestation of pregnancy: Secondary | ICD-10-CM | POA: Diagnosis not present

## 2017-07-13 DIAGNOSIS — Z8632 Personal history of gestational diabetes: Secondary | ICD-10-CM

## 2017-07-13 DIAGNOSIS — O09293 Supervision of pregnancy with other poor reproductive or obstetric history, third trimester: Secondary | ICD-10-CM | POA: Diagnosis not present

## 2017-07-16 ENCOUNTER — Other Ambulatory Visit: Payer: Self-pay

## 2017-07-17 ENCOUNTER — Other Ambulatory Visit: Payer: Self-pay | Admitting: Certified Nurse Midwife

## 2017-07-17 ENCOUNTER — Encounter: Payer: Self-pay | Admitting: Certified Nurse Midwife

## 2017-07-17 DIAGNOSIS — O3663X0 Maternal care for excessive fetal growth, third trimester, not applicable or unspecified: Secondary | ICD-10-CM

## 2017-07-17 DIAGNOSIS — O3660X0 Maternal care for excessive fetal growth, unspecified trimester, not applicable or unspecified: Secondary | ICD-10-CM | POA: Insufficient documentation

## 2017-07-18 ENCOUNTER — Other Ambulatory Visit: Payer: Self-pay | Admitting: Certified Nurse Midwife

## 2017-07-18 DIAGNOSIS — N898 Other specified noninflammatory disorders of vagina: Secondary | ICD-10-CM

## 2017-07-18 MED ORDER — TERCONAZOLE 0.8 % VA CREA: 1.0000 | TOPICAL_CREAM | Freq: Every day | VAGINAL | 0 refills | Status: DC

## 2017-07-18 NOTE — Telephone Encounter (Signed)
-----   Message from Mychart, Generic sent at 07/12/2017 6:15 PM EDT -----   Harland Germanequiara E Pollett would like a refill of the following medications:    terconazole (TERAZOL 3) 0.8 % vaginal cream Roe Coombs[Rachelle A Denney, CNM]   [phone][facility][id number] - [place], [place] - [address]

## 2017-07-19 ENCOUNTER — Other Ambulatory Visit: Payer: Self-pay | Admitting: Obstetrics

## 2017-07-19 NOTE — Telephone Encounter (Signed)
Rachelle Rx Terazol 3 on 07-18-17.

## 2017-07-19 NOTE — Telephone Encounter (Signed)
Patient notified via My Chart

## 2017-07-26 ENCOUNTER — Other Ambulatory Visit (HOSPITAL_COMMUNITY)
Admission: RE | Admit: 2017-07-26 | Discharge: 2017-07-26 | Disposition: A | Discharge status: Home / Self Care | Payer: Medicaid Other | Source: Ambulatory Visit | Attending: Obstetrics and Gynecology | Admitting: Obstetrics and Gynecology

## 2017-07-26 ENCOUNTER — Other Ambulatory Visit: Payer: Medicaid Other

## 2017-07-26 ENCOUNTER — Ambulatory Visit (INDEPENDENT_AMBULATORY_CARE_PROVIDER_SITE_OTHER): Payer: Medicaid Other | Admitting: Obstetrics and Gynecology

## 2017-07-26 ENCOUNTER — Encounter: Payer: Self-pay | Admitting: Obstetrics and Gynecology

## 2017-07-26 VITALS — BP 112/74 | HR 81 | Wt 229.0 lb

## 2017-07-26 DIAGNOSIS — Z2839 Other underimmunization status: Secondary | ICD-10-CM

## 2017-07-26 DIAGNOSIS — Z283 Underimmunization status: Secondary | ICD-10-CM

## 2017-07-26 DIAGNOSIS — Z3483 Encounter for supervision of other normal pregnancy, third trimester: Secondary | ICD-10-CM

## 2017-07-26 DIAGNOSIS — O3663X0 Maternal care for excessive fetal growth, third trimester, not applicable or unspecified: Secondary | ICD-10-CM

## 2017-07-26 DIAGNOSIS — Z3A00 Weeks of gestation of pregnancy not specified: Secondary | ICD-10-CM | POA: Insufficient documentation

## 2017-07-26 DIAGNOSIS — D573 Sickle-cell trait: Secondary | ICD-10-CM

## 2017-07-26 DIAGNOSIS — Z3480 Encounter for supervision of other normal pregnancy, unspecified trimester: Secondary | ICD-10-CM

## 2017-07-26 DIAGNOSIS — Z8632 Personal history of gestational diabetes: Secondary | ICD-10-CM

## 2017-07-26 DIAGNOSIS — O9989 Other specified diseases and conditions complicating pregnancy, childbirth and the puerperium: Secondary | ICD-10-CM

## 2017-07-26 DIAGNOSIS — O09899 Supervision of other high risk pregnancies, unspecified trimester: Secondary | ICD-10-CM

## 2017-07-26 DIAGNOSIS — O99013 Anemia complicating pregnancy, third trimester: Secondary | ICD-10-CM

## 2017-07-26 NOTE — Progress Notes (Signed)
Patient reports fetal movement with some pressure and irregular contractions. 

## 2017-07-26 NOTE — Progress Notes (Signed)
PRENATAL VISIT NOTE  Subjective:  Erin Wilkinson is a 22 y.o. G2P1001 at [redacted]w[redacted]d being seen today for ongoing prenatal care.  She is currently monitored for the following issues for this high-risk pregnancy and has Obesity (BMI 30-39.9); Abnormal uterine bleeding (AUB); Supervision of normal pregnancy, antepartum; History of gestational diabetes; Asthma; Sickle cell trait (HCC); Rubella non-immune status, antepartum; History of CVST associated with congenital heart disease; Anemia of mother in pregnancy; and LGA (large for gestational age) fetus affecting management of mother on their problem list.  Patient reports irregular contractions.  Contractions: Irregular. Vag. Bleeding: None.  Movement: Present. Denies leaking of fluid.   The following portions of the patient's history were reviewed and updated as appropriate: allergies, current medications, past family history, past medical history, past social history, past surgical history and problem list. Problem list updated.  Objective:   Vitals:   07/26/17 1004  BP: 112/74  Pulse: 81  Weight: 229 lb (103.9 kg)    Fetal Status: Fetal Heart Rate (bpm): 138   Movement: Present     General:  Alert, oriented and cooperative. Patient is in no acute distress.  Skin: Skin is warm and dry. No rash noted.   Cardiovascular: Normal heart rate noted  Respiratory: Normal respiratory effort, no problems with respiration noted  Abdomen: Soft, gravid, appropriate for gestational age.  Pain/Pressure: Present     Pelvic: Cervical exam deferred        Extremities: Normal range of motion.  Edema: Trace  Mental Status: Normal mood and affect. Normal behavior. Normal judgment and thought content.   Assessment and Plan:  Pregnancy: G2P1001 at [redacted]w[redacted]d  1. Supervision of other normal pregnancy, antepartum  2. History of gestational diabetes Declined gDM screen this pregnancy earlier, now consents and is doing 2 hr GTT today Has extended conversation with  patient regarding risks of gDM in relation to baby and relation to fetal weight and complications with estimated LGA baby during delivery. Reviewed risks of shoulder dystocia and that if EFW is >4500 gms and she is diabetic (2HR GTT today), then would recommend primary c-section. Will see results of GTT today and schedule her for follow up growth scan in the next couple of weeks.   3. Sickle cell trait (HCC) ROB negative  4. Rubella non-immune status, antepartum MMR pp  5. Excessive fetal growth affecting management of pregnancy in third trimester, single or unspecified fetus Last growth 4/5 >90th%tile  Preterm labor symptoms and general obstetric precautions including but not limited to vaginal bleeding, contractions, leaking of fluid and fetal movement were reviewed in detail with the patient. Please refer to After Visit Summary for other counseling recommendations.  Return in about 1 week (around 08/02/2017) for OB visit (MD).  Future Appointments  Date Time Provider Department Center  07/31/2017  9:45 AM Denney, Rachelle A, CNM [facility] None  08/13/2017 10:15 AM [facility] US 4 [facility]    Kelly M Davis, MD  

## 2017-07-27 LAB — CBC
Hematocrit: 33.9 % — ABNORMAL LOW (ref 34.0–46.6)
Hemoglobin: 11.1 g/dL (ref 11.1–15.9)
MCH: 24.4 pg — ABNORMAL LOW (ref 26.6–33.0)
MCHC: 32.7 g/dL (ref 31.5–35.7)
MCV: 75 fL — ABNORMAL LOW (ref 79–97)
PLATELETS: 227 10*3/uL (ref 150–379)
RBC: 4.55 x10E6/uL (ref 3.77–5.28)
RDW: 16.2 % — AB (ref 12.3–15.4)
WBC: 10.5 10*3/uL (ref 3.4–10.8)

## 2017-07-27 LAB — GLUCOSE TOLERANCE, 2 HOURS W/ 1HR
GLUCOSE, 1 HOUR: 112 mg/dL (ref 65–179)
GLUCOSE, FASTING: 90 mg/dL (ref 65–91)
Glucose, 2 hour: 88 mg/dL (ref 65–152)

## 2017-07-27 LAB — RPR: RPR Ser Ql: NONREACTIVE

## 2017-07-27 LAB — CERVICOVAGINAL ANCILLARY ONLY
Chlamydia: NEGATIVE
NEISSERIA GONORRHEA: NEGATIVE

## 2017-07-27 LAB — OB RESULTS CONSOLE GBS: GBS: NEGATIVE

## 2017-07-27 LAB — HIV ANTIBODY (ROUTINE TESTING W REFLEX): HIV SCREEN 4TH GENERATION: NONREACTIVE

## 2017-07-28 LAB — STREP GP B NAA: STREP GROUP B AG: NEGATIVE

## 2017-07-31 ENCOUNTER — Encounter: Payer: Medicaid Other | Admitting: Certified Nurse Midwife

## 2017-08-01 ENCOUNTER — Encounter (INDEPENDENT_AMBULATORY_CARE_PROVIDER_SITE_OTHER): Payer: Self-pay

## 2017-08-13 ENCOUNTER — Encounter (HOSPITAL_COMMUNITY): Payer: Self-pay

## 2017-08-13 ENCOUNTER — Ambulatory Visit (HOSPITAL_COMMUNITY)
Admission: RE | Admit: 2017-08-13 | Discharge: 2017-08-13 | Disposition: A | Discharge status: Home / Self Care | Payer: Medicaid Other | Source: Ambulatory Visit | Attending: Obstetrics and Gynecology | Admitting: Obstetrics and Gynecology

## 2017-08-13 ENCOUNTER — Telehealth: Payer: Self-pay | Admitting: Obstetrics and Gynecology

## 2017-08-13 ENCOUNTER — Other Ambulatory Visit: Payer: Self-pay | Admitting: Obstetrics and Gynecology

## 2017-08-13 DIAGNOSIS — J45909 Unspecified asthma, uncomplicated: Secondary | ICD-10-CM | POA: Diagnosis not present

## 2017-08-13 DIAGNOSIS — O99213 Obesity complicating pregnancy, third trimester: Secondary | ICD-10-CM

## 2017-08-13 DIAGNOSIS — O3663X0 Maternal care for excessive fetal growth, third trimester, not applicable or unspecified: Secondary | ICD-10-CM | POA: Diagnosis not present

## 2017-08-13 DIAGNOSIS — Z3A39 39 weeks gestation of pregnancy: Secondary | ICD-10-CM | POA: Diagnosis not present

## 2017-08-13 DIAGNOSIS — O3663X9 Maternal care for excessive fetal growth, third trimester, other fetus: Secondary | ICD-10-CM

## 2017-08-13 DIAGNOSIS — Z862 Personal history of diseases of the blood and blood-forming organs and certain disorders involving the immune mechanism: Secondary | ICD-10-CM

## 2017-08-13 DIAGNOSIS — O09293 Supervision of pregnancy with other poor reproductive or obstetric history, third trimester: Secondary | ICD-10-CM | POA: Insufficient documentation

## 2017-08-13 DIAGNOSIS — O99513 Diseases of the respiratory system complicating pregnancy, third trimester: Secondary | ICD-10-CM

## 2017-08-13 DIAGNOSIS — O09299 Supervision of pregnancy with other poor reproductive or obstetric history, unspecified trimester: Secondary | ICD-10-CM

## 2017-08-13 DIAGNOSIS — Z8632 Personal history of gestational diabetes: Secondary | ICD-10-CM

## 2017-08-13 LAB — HEMOGLOBIN A1C
Hgb A1c MFr Bld: 5.5 % (ref 4.8–5.6)
Mean Plasma Glucose: 111.15 mg/dL

## 2017-08-13 NOTE — Telephone Encounter (Signed)
Pt came by after ultrasound to make fu appt. Given appt 08/20/17. Pt is [redacted]w[redacted]d today. Please advise if she needs sooner appt.

## 2017-08-14 ENCOUNTER — Encounter: Payer: Medicaid Other | Admitting: Certified Nurse Midwife

## 2017-08-14 ENCOUNTER — Ambulatory Visit (INDEPENDENT_AMBULATORY_CARE_PROVIDER_SITE_OTHER): Payer: Medicaid Other | Admitting: Obstetrics & Gynecology

## 2017-08-14 ENCOUNTER — Other Ambulatory Visit: Payer: Self-pay | Admitting: Advanced Practice Midwife

## 2017-08-14 VITALS — BP 105/71 | HR 88 | Wt 234.0 lb

## 2017-08-14 DIAGNOSIS — Z3480 Encounter for supervision of other normal pregnancy, unspecified trimester: Secondary | ICD-10-CM

## 2017-08-14 DIAGNOSIS — O3663X0 Maternal care for excessive fetal growth, third trimester, not applicable or unspecified: Secondary | ICD-10-CM

## 2017-08-14 NOTE — Patient Instructions (Signed)
Labor Induction Labor induction is when steps are taken to cause a pregnant woman to begin the labor process. Most women go into labor on their own between 37 weeks and 42 weeks of the pregnancy. When this does not happen or when there is a medical need, methods may be used to induce labor. Labor induction causes a pregnant woman's uterus to contract. It also causes the cervix to soften (ripen), open (dilate), and thin out (efface). Usually, labor is not induced before 39 weeks of the pregnancy unless there is a problem with the baby or mother. Before inducing labor, your health care provider will consider a number of factors, including the following:  The medical condition of you and the baby.  How many weeks along you are.  The status of the baby's lung maturity.  The condition of the cervix.  The position of the baby. What are the reasons for labor induction? Labor may be induced for the following reasons:  The health of the baby or mother is at risk.  The pregnancy is overdue by 1 week or more.  The water breaks but labor does not start on its own.  The mother has a health condition or serious illness, such as high blood pressure, infection, placental abruption, or diabetes.  The amniotic fluid amounts are low around the baby.  The baby is distressed. Convenience or wanting the baby to be born on a certain date is not a reason for inducing labor. What methods are used for labor induction? Several methods of labor induction may be used, such as:  Prostaglandin medicine. This medicine causes the cervix to dilate and ripen. The medicine will also start contractions. It can be taken by mouth or by inserting a suppository into the vagina.  Inserting a thin tube (catheter) with a balloon on the end into the vagina to dilate the cervix. Once inserted, the balloon is expanded with water, which causes the cervix to open.  Stripping the membranes. Your health care provider separates  amniotic sac tissue from the cervix, causing the cervix to be stretched and causing the release of a hormone called progesterone. This may cause the uterus to contract. It is often done during an office visit. You will be sent home to wait for the contractions to begin. You will then come in for an induction.  Breaking the water. Your health care provider makes a hole in the amniotic sac using a small instrument. Once the amniotic sac breaks, contractions should begin. This may still take hours to see an effect.  Medicine to trigger or strengthen contractions. This medicine is given through an IV access tube inserted into a vein in your arm. All of the methods of induction, besides stripping the membranes, will be done in the hospital. Induction is done in the hospital so that you and the baby can be carefully monitored. How long does it take for labor to be induced? Some inductions can take up to 2-3 days. Depending on the cervix, it usually takes less time. It takes longer when you are induced early in the pregnancy or if this is your first pregnancy. If a mother is still pregnant and the induction has been going on for 2-3 days, either the mother will be sent home or a cesarean delivery will be needed. What are the risks associated with labor induction? Some of the risks of induction include:  Changes in fetal heart rate, such as too high, too low, or erratic.  Fetal distress.    Chance of infection for the mother and baby.  Increased chance of having a cesarean delivery.  Breaking off (abruption) of the placenta from the uterus (rare).  Uterine rupture (very rare). When induction is needed for medical reasons, the benefits of induction may outweigh the risks. What are some reasons for not inducing labor? Labor induction should not be done if:  It is shown that your baby does not tolerate labor.  You have had previous surgeries on your uterus, such as a myomectomy or the removal of  fibroids.  Your placenta lies very low in the uterus and blocks the opening of the cervix (placenta previa).  Your baby is not in a head-down position.  The umbilical cord drops down into the birth canal in front of the baby. This could cut off the baby's blood and oxygen supply.  You have had a previous cesarean delivery.  There are unusual circumstances, such as the baby being extremely premature. This information is not intended to replace advice given to you by your health care provider. Make sure you discuss any questions you have with your health care provider. Document Released: 08/16/2006 Document Revised: 09/02/2015 Document Reviewed: 10/24/2012 Elsevier Interactive Patient Education  2017 [employer]  

## 2017-08-14 NOTE — Progress Notes (Signed)
   PRENATAL VISIT NOTE  Subjective:  Erin Wilkinson is a 22 y.o. G2P1001 at [redacted]w[redacted]d being seen today for ongoing prenatal care.  She is currently monitored for the following issues for this high-risk pregnancy and has Obesity (BMI 30-39.9); Abnormal uterine bleeding (AUB); Supervision of normal pregnancy, antepartum; History of gestational diabetes; Asthma; Sickle cell trait (HCC); Rubella non-immune status, antepartum; History of CVST associated with congenital heart disease; Anemia of mother in pregnancy; and LGA (large for gestational age) fetus affecting management of mother on their problem list.  Patient reports occasional contractions.  Contractions: Irregular. Vag. Bleeding: None.  Movement: Present. Denies leaking of fluid.   The following portions of the patient's history were reviewed and updated as appropriate: allergies, current medications, past family history, past medical history, past social history, past surgical history and problem list. Problem list updated.  Objective:   Vitals:   08/14/17 1503  BP: 105/71  Pulse: 88  Weight: 234 lb (106.1 kg)    Fetal Status: Fetal Heart Rate (bpm): 135 Fundal Height: 37 cm Movement: Present  Presentation: Vertex  General:  Alert, oriented and cooperative. Patient is in no acute distress.  Skin: Skin is warm and dry. No rash noted.   Cardiovascular: Normal heart rate noted  Respiratory: Normal respiratory effort, no problems with respiration noted  Abdomen: Soft, gravid, appropriate for gestational age.  Pain/Pressure: Present     Pelvic: Cervical exam performed Dilation: 1.5 Effacement (%): 50 Station: -2  Extremities: Normal range of motion.  Edema: Trace  Mental Status: Normal mood and affect. Normal behavior. Normal judgment and thought content.   Assessment and Plan:  Pregnancy: G2P1001 at [redacted]w[redacted]d  There are no diagnoses linked to this encounter. Term labor symptoms and general obstetric precautions including but not limited  to vaginal bleeding, contractions, leaking of fluid and fetal movement were reviewed in detail with the patient. Please refer to After Visit Summary for other counseling recommendations.  She requests elective IOL.  EFW is macrosomic and nearly 4500g. To avoid potential  birth trauma, ACOG recommends that offering a primary  cesarean delivery be limited to estimated fetal weights of at  least 5,000 g in women without diabetes and at least 4,500 g  in women with diabetes. I have taken the liberty of getting a  HgbA1C today. If it is <6.0 I would call this patient very low  risk for gestational diabet and proceed with attempted vaginal  delivery, with the understanding that there is an elevated risk  for shoulder dystocia and subsequent birth trauma. I advised  her to make an appointment in the clinic immediately for a  discussion of her options and her wishes concerning delivery  choice Dr Allene Pyo note above reviewed. Glucose screen is negative and her pelvis is adequate and cervix is favorable for induction, which will be scheduled at her request Future Appointments  Date Time Provider Department Center  08/20/2017  1:35 PM Armando Reichert, CNM [facility]    Scheryl Darter, MD

## 2017-08-15 ENCOUNTER — Inpatient Hospital Stay (HOSPITAL_COMMUNITY): Payer: Medicaid Other | Admitting: Anesthesiology

## 2017-08-15 ENCOUNTER — Inpatient Hospital Stay (HOSPITAL_COMMUNITY)
Admission: RE | Admit: 2017-08-15 | Discharge: 2017-08-17 | DRG: 807 | Disposition: A | Discharge status: Home / Self Care | Payer: Medicaid Other | Source: Ambulatory Visit | Attending: Obstetrics and Gynecology | Admitting: Obstetrics and Gynecology

## 2017-08-15 ENCOUNTER — Encounter: Payer: Medicaid Other | Admitting: Obstetrics & Gynecology

## 2017-08-15 ENCOUNTER — Other Ambulatory Visit: Payer: Self-pay

## 2017-08-15 DIAGNOSIS — D649 Anemia, unspecified: Secondary | ICD-10-CM | POA: Diagnosis present

## 2017-08-15 DIAGNOSIS — O24429 Gestational diabetes mellitus in childbirth, unspecified control: Secondary | ICD-10-CM | POA: Diagnosis not present

## 2017-08-15 DIAGNOSIS — D573 Sickle-cell trait: Secondary | ICD-10-CM | POA: Diagnosis present

## 2017-08-15 DIAGNOSIS — Z3A39 39 weeks gestation of pregnancy: Secondary | ICD-10-CM | POA: Diagnosis not present

## 2017-08-15 DIAGNOSIS — Z3480 Encounter for supervision of other normal pregnancy, unspecified trimester: Secondary | ICD-10-CM

## 2017-08-15 DIAGNOSIS — O99214 Obesity complicating childbirth: Secondary | ICD-10-CM | POA: Diagnosis present

## 2017-08-15 DIAGNOSIS — Z283 Underimmunization status: Secondary | ICD-10-CM

## 2017-08-15 DIAGNOSIS — N3289 Other specified disorders of bladder: Secondary | ICD-10-CM | POA: Diagnosis present

## 2017-08-15 DIAGNOSIS — O3663X0 Maternal care for excessive fetal growth, third trimester, not applicable or unspecified: Principal | ICD-10-CM | POA: Diagnosis present

## 2017-08-15 DIAGNOSIS — Z789 Other specified health status: Secondary | ICD-10-CM

## 2017-08-15 DIAGNOSIS — O9989 Other specified diseases and conditions complicating pregnancy, childbirth and the puerperium: Secondary | ICD-10-CM

## 2017-08-15 DIAGNOSIS — O9902 Anemia complicating childbirth: Secondary | ICD-10-CM | POA: Diagnosis present

## 2017-08-15 DIAGNOSIS — E669 Obesity, unspecified: Secondary | ICD-10-CM | POA: Diagnosis present

## 2017-08-15 LAB — GLUCOSE, CAPILLARY
GLUCOSE-CAPILLARY: 111 mg/dL — AB (ref 65–99)
Glucose-Capillary: 98 mg/dL (ref 65–99)
Glucose-Capillary: 105 mg/dL — ABNORMAL HIGH (ref 65–99)
Glucose-Capillary: 99 mg/dL (ref 65–99)

## 2017-08-15 LAB — CBC
HEMATOCRIT: 34.3 % — AB (ref 36.0–46.0)
Hemoglobin: 11.7 g/dL — ABNORMAL LOW (ref 12.0–15.0)
MCH: 25.5 pg — ABNORMAL LOW (ref 26.0–34.0)
MCHC: 34.1 g/dL (ref 30.0–36.0)
MCV: 74.7 fL — ABNORMAL LOW (ref 78.0–100.0)
Platelets: 232 10*3/uL (ref 150–400)
RBC: 4.59 MIL/uL (ref 3.87–5.11)
RDW: 16.5 % — AB (ref 11.5–15.5)
WBC: 11.8 10*3/uL — ABNORMAL HIGH (ref 4.0–10.5)

## 2017-08-15 LAB — TYPE AND SCREEN
ABO/RH(D): O POS
Antibody Screen: NEGATIVE

## 2017-08-15 LAB — RPR: RPR Ser Ql: NONREACTIVE

## 2017-08-15 LAB — GLUCOSE, RANDOM: GLUCOSE: 126 mg/dL — AB (ref 65–99)

## 2017-08-15 MED ORDER — OXYTOCIN 40 UNITS IN LACTATED RINGERS INFUSION - SIMPLE MED
2.5000 [IU]/h | INTRAVENOUS | Status: DC
  Administered 2017-08-15: 2.5 [IU]/h via INTRAVENOUS
  Filled 2017-08-15: qty 1000

## 2017-08-15 MED ORDER — OXYCODONE-ACETAMINOPHEN 5-325 MG PO TABS: 1.0000 | ORAL_TABLET | ORAL | Status: DC | PRN

## 2017-08-15 MED ORDER — LACTATED RINGERS IV SOLN
500.0000 mL | INTRAVENOUS | Status: DC | PRN
  Administered 2017-08-15: 1000 mL via INTRAVENOUS

## 2017-08-15 MED ORDER — DIPHENHYDRAMINE HCL 50 MG/ML IJ SOLN: 12.5000 mg | INTRAMUSCULAR | Status: DC | PRN

## 2017-08-15 MED ORDER — IBUPROFEN 600 MG PO TABS
600.0000 mg | ORAL_TABLET | Freq: Four times a day (QID) | ORAL | Status: DC
  Administered 2017-08-16 – 2017-08-17 (×4): 600 mg via ORAL
  Filled 2017-08-15 (×6): qty 1

## 2017-08-15 MED ORDER — MISOPROSTOL 50MCG HALF TABLET
50.0000 ug | ORAL_TABLET | ORAL | Status: DC | PRN
  Administered 2017-08-15 (×2): 50 ug via BUCCAL
  Filled 2017-08-15 (×3): qty 1

## 2017-08-15 MED ORDER — FENTANYL 2.5 MCG/ML BUPIVACAINE 1/10 % EPIDURAL INFUSION (WH - ANES)
14.0000 mL/h | INTRAMUSCULAR | Status: DC | PRN
  Administered 2017-08-15: 14 mL/h via EPIDURAL
  Filled 2017-08-15 (×2): qty 100

## 2017-08-15 MED ORDER — EPHEDRINE 5 MG/ML INJ
10.0000 mg | INTRAVENOUS | Status: DC | PRN
  Filled 2017-08-15: qty 2

## 2017-08-15 MED ORDER — TERBUTALINE SULFATE 1 MG/ML IJ SOLN
0.2500 mg | Freq: Once | INTRAMUSCULAR | Status: DC | PRN
  Filled 2017-08-15: qty 1

## 2017-08-15 MED ORDER — PHENYLEPHRINE 40 MCG/ML (10ML) SYRINGE FOR IV PUSH (FOR BLOOD PRESSURE SUPPORT)
80.0000 ug | PREFILLED_SYRINGE | INTRAVENOUS | Status: DC | PRN
  Filled 2017-08-15: qty 5
  Filled 2017-08-15: qty 10

## 2017-08-15 MED ORDER — ONDANSETRON HCL 4 MG/2ML IJ SOLN: 4.0000 mg | Freq: Four times a day (QID) | INTRAMUSCULAR | Status: DC | PRN

## 2017-08-15 MED ORDER — SOD CITRATE-CITRIC ACID 500-334 MG/5ML PO SOLN: 30.0000 mL | ORAL | Status: DC | PRN

## 2017-08-15 MED ORDER — LACTATED RINGERS IV SOLN
INTRAVENOUS | Status: DC
  Administered 2017-08-15 (×2): via INTRAVENOUS

## 2017-08-15 MED ORDER — FENTANYL 2.5 MCG/ML BUPIVACAINE 1/10 % EPIDURAL INFUSION (WH - ANES): 14.0000 mL/h | INTRAMUSCULAR | Status: DC | PRN

## 2017-08-15 MED ORDER — LACTATED RINGERS IV SOLN
500.0000 mL | Freq: Once | INTRAVENOUS | Status: AC
  Administered 2017-08-15: 500 mL via INTRAVENOUS

## 2017-08-15 MED ORDER — LIDOCAINE HCL (PF) 1 % IJ SOLN
INTRAMUSCULAR | Status: DC | PRN
  Administered 2017-08-15 (×2): 4 mL via EPIDURAL

## 2017-08-15 MED ORDER — PHENYLEPHRINE 40 MCG/ML (10ML) SYRINGE FOR IV PUSH (FOR BLOOD PRESSURE SUPPORT)
80.0000 ug | PREFILLED_SYRINGE | INTRAVENOUS | Status: AC | PRN
  Administered 2017-08-15 (×3): 80 ug via INTRAVENOUS

## 2017-08-15 MED ORDER — OXYTOCIN BOLUS FROM INFUSION
500.0000 mL | Freq: Once | INTRAVENOUS | Status: AC
  Administered 2017-08-15: 500 mL via INTRAVENOUS

## 2017-08-15 MED ORDER — OXYCODONE-ACETAMINOPHEN 5-325 MG PO TABS: 2.0000 | ORAL_TABLET | ORAL | Status: DC | PRN

## 2017-08-15 MED ORDER — FENTANYL CITRATE (PF) 100 MCG/2ML IJ SOLN: 100.0000 ug | INTRAMUSCULAR | Status: DC | PRN

## 2017-08-15 MED ORDER — ACETAMINOPHEN 325 MG PO TABS: 650.0000 mg | ORAL_TABLET | ORAL | Status: DC | PRN

## 2017-08-15 MED ORDER — LIDOCAINE HCL (PF) 1 % IJ SOLN
30.0000 mL | INTRAMUSCULAR | Status: DC | PRN
  Filled 2017-08-15: qty 30

## 2017-08-15 NOTE — Anesthesia Preprocedure Evaluation (Signed)
Anesthesia Evaluation  Patient identified by MRN, date of birth, ID band Patient awake    Reviewed: Allergy & Precautions, NPO status , Patient's Chart, lab work & pertinent test results  Airway Mallampati: II  TM Distance: >3 FB Neck ROM: Full    Dental no notable dental hx.    Pulmonary shortness of breath, asthma ,    Pulmonary exam normal breath sounds clear to auscultation       Cardiovascular negative cardio ROS Normal cardiovascular exam Rhythm:Regular Rate:Normal     Neuro/Psych  Headaches, negative neurological ROS  negative psych ROS   GI/Hepatic negative GI ROS, Neg liver ROS,   Endo/Other  diabetes, GestationalMorbid obesity  Renal/GU negative Renal ROS     Musculoskeletal negative musculoskeletal ROS (+)   Abdominal (+) + obese,   Peds  Hematology  (+) anemia ,   Anesthesia Other Findings   Reproductive/Obstetrics (+) Pregnancy                             Anesthesia Physical Anesthesia Plan  ASA: III  Anesthesia Plan: Epidural   Post-op Pain Management:    Induction:   PONV Risk Score and Plan:   Airway Management Planned:   Additional Equipment:   Intra-op Plan:   Post-operative Plan:   Informed Consent: I have reviewed the patients History and Physical, chart, labs and discussed the procedure including the risks, benefits and alternatives for the proposed anesthesia with the patient or authorized representative who has indicated his/her understanding and acceptance.     Plan Discussed with:   Anesthesia Plan Comments:         Anesthesia Quick Evaluation

## 2017-08-15 NOTE — Progress Notes (Signed)
LABOR PROGRESS NOTE  Erin Wilkinson is a 22 y.o. G2P1001 at [redacted]w[redacted]d  admitted for IOL for LGA  Subjective: Patient had epidural placed. Pain now well-controlled  Objective: BP (!) 116/55   Pulse 85   Temp 98.2 F (36.8 C) (Oral)   Resp 16   Ht  (1.575 m)   Wt 234 lb (106.1 kg)   LMP 11/11/2016 (Exact Date)   SpO2 100%   BMI 42.80 kg/m  or  Vitals:   08/15/17 1716 08/15/17 1721 08/15/17 1801 08/15/17 1831  BP:      Pulse:      Resp: Temp:  98.2 F (36.8 C)    TempSrc:  Oral    SpO2:      Weight:      Height:         Dilation: 6 Effacement (%): 90 Cervical Position: Posterior Station: -1 Presentation: Vertex Exam by:: Foley,rn FHT: baseline rate 130, moderate varibility, +acel, variable decels   Assessment / Plan: 22 y.o. G2P1001 at [redacted]w[redacted]d here for IOL for LGA  Labor: progressing well, IUPC placed Fetal Wellbeing:  Cat II Pain Control:  Well-controlled with epidural Anticipated MOD:  SVD  Frederik Pear, MD 08/15/2017, 6:59 PM

## 2017-08-15 NOTE — H&P (Addendum)
OBSTETRIC ADMISSION HISTORY AND PHYSICAL  Erin Wilkinson is a 22 y.o. female G2P1001 with IUP at [redacted]w[redacted]d by LMP presenting for IOL for LGA at term. She reports +FMs, No LOF, no VB, no blurry vision, headaches or peripheral edema, and RUQ pain.  She plans on breast feeding. She is not interested in birth control at this point. She received her prenatal care at [facility]   Dating: By LMP --->  Estimated Date of Delivery: 08/18/17  Sono:   , CWD, normal anatomy, cephalic presentation,  Posterior placenta, 4429g, >90% EFW  Prenatal History/Complications: - LGA (EFW 4429, 9lb12oz, >90% at [redacted]w[redacted]d) - Rubella non-immune - Hx asthma - Hx of GDM (initially refused screening this pregnancy; 2hr GTT done at 36 weeks and normal; Hgb A1c 5.5%) - Obesity - Prior child with CHD  Past Medical History: Past Medical History:  Diagnosis Date  . Asthma   . Dyspnea   . Gestational diabetes   . Gestational diabetes mellitus in pregnancy    new diagnosis 01/2011  . Headache(784.0)   . History of chronic bronchitis   . History of depression   . Syncope     Past Surgical History: Past Surgical History:  Procedure Laterality Date  . NO PAST SURGERIES      Obstetrical History: OB History    Gravida  2   Para  1   Term  1   Preterm      AB      Living  1     SAB      TAB      Ectopic      Multiple      Live Births  1           Social History: Social History   Socioeconomic History  . Marital status: Single    Spouse name: Not on file  . Number of children: Not on file  . Years of education: Not on file  . Highest education level: Not on file  Occupational History  . Not on file  Social Needs  . Financial resource strain: Not on file  . Food insecurity:    Worry: Not on file    Inability: Not on file  . Transportation needs:    Medical: Not on file    Non-medical: Not on file  Tobacco Use  . Smoking status: Never Smoker  . Smokeless tobacco: Never Used   Substance and Sexual Activity  . Alcohol use: No    Alcohol/week: 0.0 oz  . Drug use: No  . Sexual activity: Yes    Partners: Male    Birth control/protection: None  Lifestyle  . Physical activity:    Days per week: Not on file    Minutes per session: Not on file  . Stress: Not on file  Relationships  . Social connections:    Talks on phone: Not on file    Gets together: Not on file    Attends religious service: Not on file    Active member of club or organization: Not on file    Attends meetings of clubs or organizations: Not on file    Relationship status: Not on file  Other Topics Concern  . Not on file  Social History Narrative  . Not on file    Family History: Family History  Problem Relation Age of Onset  . Asthma Mother   . Depression Mother   . Miscarriages / [place] Mother   . Cancer Mother   .  Cancer Maternal Uncle   . Diabetes Maternal Grandmother   . Hypertension Maternal Grandmother   . Stroke Paternal Grandmother   . Miscarriages / Stillbirths Sister   . Heart disease Paternal Aunt   . Heart disease Paternal Uncle     Allergies: No Known Allergies  Medications Prior to Admission  Medication Sig Dispense Refill Last Dose  . Prenatal-DSS-FeCb-FeGl-FA (CITRANATAL BLOOM) 90-1 MG TABS Take 1 tablet by mouth daily. 30 tablet 12 08/14/2017 at Unknown time  . terconazole (TERAZOL 3) 0.8 % vaginal cream Place 1 applicator vaginally at bedtime. 20 g 0 Past Month at Unknown time  . ondansetron (ZOFRAN-ODT) 4 MG disintegrating tablet Take 4 mg by mouth once.   Not Taking     Review of Systems   All systems reviewed and negative except as stated in HPI  Blood pressure 111/70, pulse 88, temperature 98.2 F (36.8 C), temperature source Oral, resp. rate 20, height  (1.575 m), weight 106.1 kg (234 lb), last menstrual period 11/11/2016. General appearance: alert and cooperative Lungs: clear to auscultation bilaterally Heart: regular rate and  rhythm Abdomen: soft, non-tender; bowel sounds normal Extremities: Homans sign is negative, no sign of DVT Presentation: cephalic Fetal monitoringBaseline: 130 bpm, Variability: Good {> 6 bpm), Accelerations: Reactive and Decelerations: Absent Uterine activityFrequency: Every 3-4 minutes Dilation: 2 Effacement (%): 50 Station: -3 Exam by:: Raliegh Ip RN   Prenatal labs: ABO, Rh: O/Positive/-- (11/12 1450) Antibody: Negative (11/12 1450) Rubella: <0.90 (11/12 1450) RPR: Non Reactive (04/18 1156)  HBsAg: Negative (11/12 1450)  HIV: Non Reactive (04/18 1156)  GBS: Negative (04/18 1446)  Glucose: 1 hour/fasting/2hour-> 112/90/88 Genetic screening  negative Anatomy US normal  Prenatal Transfer Tool  Maternal Diabetes: A1C <6, 1 hour/fasting/2hour all w/n/l. Genetic Screening: Normal Maternal Ultrasounds/Referrals: Normal Fetal Ultrasounds or other Referrals:  Fetal echo, Other: LGA, EFW > 95th%ile Maternal Substance Abuse:  No Significant Maternal Medications:  Meds include: Other: zofran Significant Maternal Lab Results: BV + and candida+ in 05/2017. A1C 5.5.   Results for orders placed or performed during the hospital encounter of 08/15/17 (from the past 24 hour(s))  Type and screen   Collection Time: 08/15/17  8:36 AM  Result Value Ref Range   ABO/RH(D) O POS    Antibody Screen NEG    Sample Expiration      08/18/2017 Performed at [facility], [address], [place], [place] [address]   Glucose, random   Collection Time: 08/15/17  8:44 AM  Result Value Ref Range   Glucose, Bld 126 (H) 65 - 99 mg/dL  Glucose, capillary   Collection Time: 08/15/17 10:00 AM  Result Value Ref Range   Glucose-Capillary 111 (H) 65 - 99 mg/dL    Assessment/Plan:  Erin Wilkinson is a 22 y.o. G2P1001 at [redacted]w[redacted]d here for IOL for LGA in a term infant. A1C negative, glucola all w/n/l. EFW > 95th%ile @ 39w 2d.   #Labor: cytotec for cervical ripening. AROM and pitocin down the  road #Pain: Analgesia prn #FWB: Cat 1 #ID:  negative #MOF: breast #MOC:none #Circ:  Yes, outpt  Myrene Buddy, MD  08/15/2017, 9:17 AM   OB FELLOW HISTORY AND PHYSICAL ATTESTATION I have seen and examined this patient; I agree with above documentation in the resident's note.   IOL at term d/t suspected fetal macrosomia (EFW 4429, 9lb12oz, >90% at [redacted]w[redacted]d; AC >97%). Pt has hx of GDM, initially refused GDM screening, but had 2hr GTT at 36 weeks, which was negative. Hgb A1c  3 days ago was 5.5%. Initial plasma glucose here is 126.  - Cytotec for cervical ripening given - Monitor CBGs for now given initial elevated here - Shoulder precautions at delivery  Frederik Pear, MD OB Fellow 08/15/2017, 10:27 AM

## 2017-08-15 NOTE — Anesthesia Procedure Notes (Signed)
Epidural Patient location during procedure: OB Start time: 08/15/2017 4:35 PM End time: 08/15/2017 4:37 PM  Staffing Anesthesiologist: Lewie Loron, MD Performed: anesthesiologist   Preanesthetic Checklist Completed: patient identified, pre-op evaluation, timeout performed, IV checked, risks and benefits discussed and monitors and equipment checked  Epidural Patient position: sitting Prep: site prepped and draped and DuraPrep Patient monitoring: heart rate, continuous pulse ox and blood pressure Approach: midline Location: L3-L4 Injection technique: LOR air and LOR saline  Needle:  Needle type: Tuohy  Needle gauge: 17 G Needle length: 9 cm Needle insertion depth: 6 cm Catheter type: closed end flexible Catheter size: 19 Gauge Catheter at skin depth: 11 cm Test dose: negative  Assessment Sensory level: T8 Events: blood not aspirated, injection not painful, no injection resistance, negative IV test and no paresthesia  Additional Notes Reason for block:procedure for pain

## 2017-08-15 NOTE — Progress Notes (Signed)
LABOR PROGRESS NOTE  Erin Wilkinson is a 22 y.o. G2P1001 at [redacted]w[redacted]d  admitted for IOL for LGA  Subjective: Patient feeling strong contractions.   Objective: BP 110/80   Pulse 80   Temp 98 F (36.7 C) (Oral)   Resp 20   Ht  (1.575 m)   Wt 234 lb (106.1 kg)   LMP 11/11/2016 (Exact Date)   BMI 42.80 kg/m  or  Vitals:   08/15/17 1312 08/15/17 1313 08/15/17 1415 08/15/17 1504  BP:  (!) 116/59 114/70 110/80  Pulse:  90 99 80  Resp:   20 20  Temp: 98.9 F (37.2 C)  98.3 F (36.8 C) 98 F (36.7 C)  TempSrc: Oral  Oral Oral  Weight:      Height:         Dilation: 5 Effacement (%): 80 Cervical Position: Posterior Station: -2 Presentation: Vertex Exam by:: h koran rnc FHT: baseline rate 140, moderate varibility, +acel, no decel   Assessment / Plan: 22 y.o. G2P1001 at [redacted]w[redacted]d here for IOL for LGA  Labor: progressing well s/p cytotec x 2, SROM around 1345 Fetal Wellbeing:  Cat I  Pain Control:  Supportive care, not planning on epidural.  Anticipated MOD:  SVD  Frederik Pear, MD 08/15/2017, 3:41 PM

## 2017-08-15 NOTE — Anesthesia Pain Management Evaluation Note (Signed)
  CRNA Pain Management Visit Note  Patient: Erin Wilkinson, 22 y.o., female  "Hello I am a member of the anesthesia team at [facility]. We have an anesthesia team available at all times to provide care throughout the hospital, including epidural management and anesthesia for C-section. I don't know your plan for the delivery whether it a natural birth, water birth, IV sedation, nitrous supplementation, doula or epidural, but we want to meet your pain goals."   1.Was your pain managed to your expectations on prior hospitalizations?   Yes   2.What is your expectation for pain management during this hospitalization?     Epidural  3.How can we help you reach that goal? Epidural when indicated  Record the patient's initial score and the patient's pain goal.   Pain: 0  Pain Goal: 7 [facility] wants you to be able to say your pain was always managed very well.  Cleda Clarks 08/15/2017

## 2017-08-16 ENCOUNTER — Encounter (HOSPITAL_COMMUNITY): Payer: Self-pay

## 2017-08-16 MED ORDER — ACETAMINOPHEN 325 MG PO TABS
650.0000 mg | ORAL_TABLET | ORAL | Status: DC | PRN
  Administered 2017-08-16 (×2): 650 mg via ORAL
  Filled 2017-08-16 (×2): qty 2

## 2017-08-16 MED ORDER — SENNOSIDES-DOCUSATE SODIUM 8.6-50 MG PO TABS
2.0000 | ORAL_TABLET | ORAL | Status: DC
  Administered 2017-08-17: 2 via ORAL
  Filled 2017-08-16: qty 2

## 2017-08-16 MED ORDER — ONDANSETRON HCL 4 MG/2ML IJ SOLN: 4.0000 mg | INTRAMUSCULAR | Status: DC | PRN

## 2017-08-16 MED ORDER — DIPHENHYDRAMINE HCL 25 MG PO CAPS: 25.0000 mg | ORAL_CAPSULE | Freq: Four times a day (QID) | ORAL | Status: DC | PRN

## 2017-08-16 MED ORDER — ZOLPIDEM TARTRATE 5 MG PO TABS: 5.0000 mg | ORAL_TABLET | Freq: Every evening | ORAL | Status: DC | PRN

## 2017-08-16 MED ORDER — ONDANSETRON HCL 4 MG PO TABS: 4.0000 mg | ORAL_TABLET | ORAL | Status: DC | PRN

## 2017-08-16 MED ORDER — DIBUCAINE 1 % RE OINT: 1.0000 "application " | TOPICAL_OINTMENT | RECTAL | Status: DC | PRN

## 2017-08-16 MED ORDER — SIMETHICONE 80 MG PO CHEW: 80.0000 mg | CHEWABLE_TABLET | ORAL | Status: DC | PRN

## 2017-08-16 MED ORDER — WITCH HAZEL-GLYCERIN EX PADS: 1.0000 "application " | MEDICATED_PAD | CUTANEOUS | Status: DC | PRN

## 2017-08-16 MED ORDER — COCONUT OIL OIL: 1.0000 "application " | TOPICAL_OIL | Status: DC | PRN

## 2017-08-16 MED ORDER — BENZOCAINE-MENTHOL 20-0.5 % EX AERO
1.0000 "application " | INHALATION_SPRAY | CUTANEOUS | Status: DC | PRN
  Administered 2017-08-16: 1 via TOPICAL
  Filled 2017-08-16: qty 56

## 2017-08-16 MED ORDER — PRENATAL MULTIVITAMIN CH
1.0000 | ORAL_TABLET | Freq: Every day | ORAL | Status: DC
  Administered 2017-08-17: 1 via ORAL
  Filled 2017-08-16 (×2): qty 1

## 2017-08-16 MED ORDER — TETANUS-DIPHTH-ACELL PERTUSSIS 5-2.5-18.5 LF-MCG/0.5 IM SUSP: 0.5000 mL | Freq: Once | INTRAMUSCULAR | Status: DC

## 2017-08-16 NOTE — Progress Notes (Addendum)
MOB was referred for history of depression/anxiety. * Referral screened out by Clinical Social Worker because none of the following criteria appear to apply: ~ History of anxiety/depression during this pregnancy, or of post-partum depression. ~ Diagnosis of anxiety and/or depression within last 3 years OR * MOB's symptoms currently being treated with medication and/or therapy. Please contact the Clinical Social Worker if needs arise, by MOB request, or if MOB scores greater than 9/yes to question 10 on Edinburgh Postpartum Depression Screen.  Miki Blank Boyd-Gilyard, MSW, LCSW Clinical Social Work [phone] 

## 2017-08-16 NOTE — Progress Notes (Addendum)
Post Partum Day 1 Subjective: no complaints, up ad lib, voiding and tolerating PO Erin Wilkinson is a 22 y/o G2P2002 on PPD1 s/p SVD after IOL for term/LGA. Patient is doing well with no complaints. No significant events overnight. Patient admits to having a BM, tolerating po well, and ambulating without difficulty. Her pain is well controlled. She denies chest pain, shortness of breath, and calf pain. Lochia appropriate.   Objective: Blood pressure (!) 100/53, pulse 98, temperature 98.1 F (36.7 C), temperature source Axillary, resp. rate 18, height  (1.575 m), weight 106.1 kg (234 lb), last menstrual period 11/11/2016, SpO2 100 %, unknown if currently breastfeeding.  Physical Exam:  General: alert, cooperative, appears stated age and no distress  Chest: Clear to auscultation bilaterally Heart: regular rate and rhythm Abdomen: soft, mild tenderness Lochia: mod-large Uterine Fundus: firm Incision: N/A DVT Evaluation: No signs of DVT on physical exam  Recent Labs    08/15/17 0836  HGB 11.7*  HCT 34.3*    Assessment/Plan: Plan for discharge tomorrow, Discharge home, Breastfeeding and Contraception Condoms  Discharge home tomorrow pending patient progress Contraception: Condoms (does not want to be on birth control) Feeding: breast Circumcision: Outpatient   LOS: 1 day   Caprice Renshaw, PA-S 08/16/2017, 7:33 AM   Midwife attestation Post Partum Day 1 I have seen and examined this patient and agree with above documentation in the student's note.   Erin Wilkinson is a 22 y.o. [id number] s/p SVD.  Pt denies problems with ambulating, voiding or po intake. Pain is well controlled. Method of Feeding: breast  PE:  Gen: well appearing Heart: reg rate Lungs: normal WOB Fundus firm above umbilicus and deviated to right, lochia mod w/small clots  Ext: soft, no pain, no edema  Assessment: S/p SVD PPD #1 Bladder distention  Plan for discharge: tomorrow Encouraged to void  now, watch bleeding closely  Donette Larry, CNM 12:41 PM

## 2017-08-16 NOTE — Anesthesia Postprocedure Evaluation (Signed)
Anesthesia Post Note  Patient: Erin Wilkinson  Procedure(s) Performed: AN AD HOC LABOR EPIDURAL     Patient location during evaluation: Mother Baby Anesthesia Type: Epidural Level of consciousness: awake and alert Pain management: pain level controlled Vital Signs Assessment: post-procedure vital signs reviewed and stable Respiratory status: spontaneous breathing, nonlabored ventilation and respiratory function stable Cardiovascular status: stable Postop Assessment: no headache, no backache and epidural receding Anesthetic complications: no    Last Vitals:  Vitals:   08/16/17 0116 08/16/17 0230  BP: 98/62 (!) 100/53  Pulse: 89 98  Resp: 18 18  Temp: 37.3 C 36.7 C  SpO2:      Last Pain:  Vitals:   08/16/17 0230  TempSrc: Axillary  PainSc:    Pain Goal:                 [employer]

## 2017-08-16 NOTE — Lactation Note (Signed)
This note was copied from a baby's chart. Lactation Consultation Note  Patient Name: Erin Wilkinson [id number] Date: 08/16/2017 Reason for consult: Initial assessment;Term  P2 mother whose infant is 22 hours old  Mother was breastfeeding on the right breast in the football hold as I entered.  Infant was in good position and sucking well with good jaw movements.  Mother's breasts are soft and non tender and no nipple pain noted.  Encouraged STS, breast massage and hand expression (mother did a return demonstration) and to watch for feeding cues.  Reviewed feeding cues.  Mother will feed 8-12 times/24 hours or earlier if baby shows cues.  Mom made aware of O/P services, breastfeeding support groups, community resources, and our phone # for post-discharge questions. Mother will call for assistance as needed.  Maternal Data Formula Feeding for Exclusion: No Has patient been taught Hand Expression?: Yes  Feeding Feeding Type: Breast Fed Length of feed: 15 min  LATCH Score Latch: Grasps breast easily, tongue down, lips flanged, rhythmical sucking.  Audible Swallowing: None  Type of Nipple: Everted at rest and after stimulation  Comfort (Breast/Nipple): Soft / non-tender  Hold (Positioning): No assistance needed to correctly position infant at breast.  LATCH Score: 8  Interventions Interventions: Breast feeding basics reviewed  Lactation Tools Discussed/Used     Consult Status Consult Status: Follow-up Date: 08/17/17 Follow-up type: In-patient    Harshini Trent R Larrisa Cravey 08/16/2017, 2:17 AM

## 2017-08-17 MED ORDER — IBUPROFEN 600 MG PO TABS: 600.0000 mg | ORAL_TABLET | Freq: Four times a day (QID) | ORAL | 0 refills | Status: DC

## 2017-08-17 NOTE — Lactation Note (Signed)
Lactation Consultation Note  Patient Name: Erin Wilkinson [id number] Date: 08/17/2017 Reason for consult: Follow-up assessment Mom reports that feedings are going well.  Discussed milk coming to volume and engorgement prevention and treatment.  Questions answered.  Lactation services and support reviewed and encouraged prn.  Maternal Data    Feeding    LATCH Score                   Interventions    Lactation Tools Discussed/Used     Consult Status Consult Status: Complete    Erin Wilkinson 08/17/2017, 9:36 AM

## 2017-08-17 NOTE — Progress Notes (Signed)
Patient is Non-Immune to [place], given information on the MMR vaccine. Patient declined vaccine.

## 2017-08-17 NOTE — Discharge Instructions (Signed)
Vaginal Delivery, Care After °Refer to this sheet in the next few weeks. These instructions provide you with information about caring for yourself after vaginal delivery. Your health care provider may also give you more specific instructions. Your treatment has been planned according to current medical practices, but problems sometimes occur. Call your health care provider if you have any problems or questions. °What can I expect after the procedure? °After vaginal delivery, it is common to have: °· Some bleeding from your vagina. °· Soreness in your abdomen, your vagina, and the area of skin between your vaginal opening and your anus (perineum). °· Pelvic cramps. °· Fatigue. ° °Follow these instructions at home: °Medicines °· Take over-the-counter and prescription medicines only as told by your health care provider. °· If you were prescribed an antibiotic medicine, take it as told by your health care provider. Do not stop taking the antibiotic until it is finished. °Driving ° °· Do not drive or operate heavy machinery while taking prescription pain medicine. °· Do not drive for 24 hours if you received a sedative. °Lifestyle °· Do not drink alcohol. This is especially important if you are breastfeeding or taking medicine to relieve pain. °· Do not use tobacco products, including cigarettes, chewing tobacco, or e-cigarettes. If you need help quitting, ask your health care provider. °Eating and drinking °· Drink at least 8 eight-ounce glasses of water every day unless you are told not to by your health care provider. If you choose to breastfeed your baby, you may need to drink more water than this. °· Eat high-fiber foods every day. These foods may help prevent or relieve constipation. High-fiber foods include: °? Whole grain cereals and breads. °? Brown rice. °? Beans. °? Fresh fruits and vegetables. °Activity °· Return to your normal activities as told by your health care provider. Ask your health care provider  what activities are safe for you. °· Rest as much as possible. Try to rest or take a nap when your baby is sleeping. °· Do not lift anything that is heavier than your baby or 10 lb (4.5 kg) until your health care provider says that it is safe. °· Talk with your health care provider about when you can engage in sexual activity. This may depend on your: °? Risk of infection. °? Rate of healing. °? Comfort and desire to engage in sexual activity. °Vaginal Care °· If you have an episiotomy or a vaginal tear, check the area every day for signs of infection. Check for: °? More redness, swelling, or pain. °? More fluid or blood. °? Warmth. °? Pus or a bad smell. °· Do not use tampons or douches until your health care provider says this is safe. °· Watch for any blood clots that may pass from your vagina. These may look like clumps of dark red, brown, or black discharge. °General instructions °· Keep your perineum clean and dry as told by your health care provider. °· Wear loose, comfortable clothing. °· Wipe from front to back when you use the toilet. °· Ask your health care provider if you can shower or take a bath. If you had an episiotomy or a perineal tear during labor and delivery, your health care provider may tell you not to take baths for a certain length of time. °· Wear a bra that supports your breasts and fits you well. °· If possible, have someone help you with household activities and help care for your baby for at least a few days after   you leave the hospital. °· Keep all follow-up visits for you and your baby as told by your health care provider. This is important. °Contact a health care provider if: °· You have: °? Vaginal discharge that has a bad smell. °? Difficulty urinating. °? Pain when urinating. °? A sudden increase or decrease in the frequency of your bowel movements. °? More redness, swelling, or pain around your episiotomy or vaginal tear. °? More fluid or blood coming from your episiotomy or  vaginal tear. °? Pus or a bad smell coming from your episiotomy or vaginal tear. °? A fever. °? A rash. °? Little or no interest in activities you used to enjoy. °? Questions about caring for yourself or your baby. °· Your episiotomy or vaginal tear feels warm to the touch. °· Your episiotomy or vaginal tear is separating or does not appear to be healing. °· Your breasts are painful, hard, or turn red. °· You feel unusually sad or worried. °· You feel nauseous or you vomit. °· You pass large blood clots from your vagina. If you pass a blood clot from your vagina, save it to show to your health care provider. Do not flush blood clots down the toilet without having your health care provider look at them. °· You urinate more than usual. °· You are dizzy or light-headed. °· You have not breastfed at all and you have not had a menstrual period for 12 weeks after delivery. °· You have stopped breastfeeding and you have not had a menstrual period for 12 weeks after you stopped breastfeeding. °Get help right away if: °· You have: °? Pain that does not go away or does not get better with medicine. °? Chest pain. °? Difficulty breathing. °? Blurred vision or spots in your vision. °? Thoughts about hurting yourself or your baby. °· You develop pain in your abdomen or in one of your legs. °· You develop a severe headache. °· You faint. °· You bleed from your vagina so much that you fill two sanitary pads in one hour. °This information is not intended to replace advice given to you by your health care provider. Make sure you discuss any questions you have with your health care provider. °Document Released: 03/24/2000 Document Revised: 09/08/2015 Document Reviewed: 04/11/2015 °Elsevier Interactive Patient Education © 2018 [employer] ° °

## 2017-08-17 NOTE — Discharge Summary (Addendum)
OB Discharge Summary     Patient Name: Erin Wilkinson DOB: 05/01/1995 MRN: [id number]  Date of admission: 08/15/2017 Delivering MD: Pincus Large   Date of discharge: 08/17/2017  Admitting diagnosis: INDUCTION Intrauterine pregnancy: [redacted]w[redacted]d     Secondary diagnosis:  Active Problems:   Sickle cell trait (HCC)   Rubella non-immune status, antepartum   SVD (spontaneous vaginal delivery)  Additional problems: none     Discharge diagnosis: Term Pregnancy Delivered                                                                                                Post partum procedures:none  Augmentation: Pitocin and Cytotec  Complications: None  Hospital course:  Induction of Labor With Vaginal Delivery   22 y.o. yo [id number] at [redacted]w[redacted]d was admitted to the hospital 08/15/2017 for induction of labor.  Indication for induction: elective.  Patient had an uncomplicated labor course as follows: Membrane Rupture Time/Date: 1:52 PM ,08/15/2017   Intrapartum Procedures: Episiotomy: None [1]                                         Lacerations:  None [1]  Patient had delivery of a Viable infant.  Information for the patient's newborn:  Mera, Gunkel [[id number]]  Delivery Method: Vag-Spont   08/15/2017  Details of delivery can be found in separate delivery note.  Patient had a routine postpartum course. Patient is discharged home 08/17/17.  Physical exam  Vitals:   08/16/17 0230 08/16/17 1030 08/16/17 1831 08/17/17 0535  BP: (!) 100/53 99/64 97/60  90/62  Pulse: 98 77 81 76  Resp: Temp: 98.1 F (36.7 C) 98.6 F (37 C) 98.3 F (36.8 C) 98.4 F (36.9 C)  TempSrc: Axillary Axillary Oral Oral  SpO2:  99%  99%  Weight:      Height:       General: alert, cooperative and no distress Lochia: appropriate Uterine Fundus: firm Incision: N/A DVT Evaluation: No evidence of DVT seen on physical exam. Negative Homan's sign. Labs: Lab Results  Component Value Date   WBC 11.8  (H) 08/15/2017   HGB 11.7 (L) 08/15/2017   HCT 34.3 (L) 08/15/2017   MCV 74.7 (L) 08/15/2017   PLT 232 08/15/2017   CMP Latest Ref Rng & Units 08/15/2017  Glucose 65 - 99 mg/dL 259(D)  BUN 6 - 20 mg/dL -  Creatinine 6.38 - 7.56 mg/dL -  Sodium 433 - 295 mmol/L -  Potassium 3.5 - 5.1 mmol/L -  Chloride 101 - 111 mmol/L -  CO2 22 - 32 mmol/L -  Calcium 8.9 - 10.3 mg/dL -  Total Protein 6.5 - 8.1 g/dL -  Total Bilirubin 0.3 - 1.2 mg/dL -  Alkaline Phos 38 - 188 U/L -  AST 15 - 41 U/L -  ALT 14 - 54 U/L -    Discharge instruction: per After Visit Summary and "Baby and Me Booklet".  After visit meds:  Allergies as  of 08/17/2017   No Known Allergies     Medication List    STOP taking these medications   CITRANATAL BLOOM 90-1 MG Tabs   ondansetron 4 MG disintegrating tablet Commonly known as:  ZOFRAN-ODT   terconazole 0.8 % vaginal cream Commonly known as:  TERAZOL 3     TAKE these medications   ibuprofen 600 MG tablet Commonly known as:  ADVIL,MOTRIN Take 1 tablet (600 mg total) by mouth every 6 (six) hours.       Diet: routine diet  Activity: Advance as tolerated. Pelvic rest for 6 weeks.   Outpatient follow up:6 weeks Follow up Appt: Future Appointments  Date Time Provider Department Center  09/13/2017  8:30 AM Orvilla Cornwall A, CNM [facility] None   Follow up Visit:No follow-ups on file.  Postpartum contraception: None  Newborn Data: Live born female  Birth Weight: 7 lb 8.3 oz (3410 g) APGAR: 8, 9  Newborn Delivery   Birth date/time:  08/15/2017 23:15:00 Delivery type:  Vaginal, Spontaneous     Baby Feeding: Breast Disposition:home with mother   08/17/2017 Myrene Buddy, MD   Midwife attestation I have seen and examined this patient and agree with above documentation in the resident's note.   Erin Wilkinson is a 22 y.o. [id number] s/p NSVD.   Pain is well controlled.  Plan for birth control is undecided, discussed LARCs as most effective methods  of contraception..  Method of Feeding: breast  PE:  BP 90/62 (BP Location: Right Arm)   Pulse 76   Temp 98.4 F (36.9 C) (Oral)   Resp 18   Ht  (1.575 m)   Wt 234 lb (106.1 kg)   LMP 11/11/2016 (Exact Date)   SpO2 99%   Breastfeeding? Unknown   BMI 42.80 kg/m  Gen: well appearing Heart: reg rate Lungs: normal WOB Fundus firm Ext: soft, no pain, no edema  Recent Labs    08/15/17 0836  HGB 11.7*  HCT 34.3*     Plan: discharge today - postpartum care discussed - f/u clinic in 6 weeks for postpartum visit   Sharen Counter, CNM 12:16 PM

## 2017-08-20 ENCOUNTER — Encounter: Payer: Medicaid Other | Admitting: Advanced Practice Midwife

## 2017-09-13 ENCOUNTER — Ambulatory Visit (INDEPENDENT_AMBULATORY_CARE_PROVIDER_SITE_OTHER): Payer: Medicaid Other | Admitting: Certified Nurse Midwife

## 2017-09-13 ENCOUNTER — Other Ambulatory Visit (HOSPITAL_COMMUNITY)
Admission: RE | Admit: 2017-09-13 | Discharge: 2017-09-13 | Disposition: A | Discharge status: Home / Self Care | Payer: Medicaid Other | Source: Ambulatory Visit | Attending: Certified Nurse Midwife | Admitting: Certified Nurse Midwife

## 2017-09-13 ENCOUNTER — Encounter: Payer: Self-pay | Admitting: Certified Nurse Midwife

## 2017-09-13 ENCOUNTER — Other Ambulatory Visit: Payer: Self-pay

## 2017-09-13 DIAGNOSIS — Z1389 Encounter for screening for other disorder: Secondary | ICD-10-CM

## 2017-09-13 DIAGNOSIS — N898 Other specified noninflammatory disorders of vagina: Secondary | ICD-10-CM

## 2017-09-13 MED ORDER — PRENATE PIXIE 10-0.6-0.4-200 MG PO CAPS: 1.0000 | ORAL_CAPSULE | Freq: Every day | ORAL | 12 refills | Status: DC

## 2017-09-13 NOTE — Progress Notes (Signed)
Subjective:     Erin Wilkinson is a 22 y.o. female who presents for a postpartum visit. She is 4 weeks postpartum following a spontaneous vaginal delivery. I have fully reviewed the prenatal and intrapartum course. The delivery was at 39.4 gestational weeks. Outcome: spontaneous vaginal delivery. Anesthesia: epidural. Postpartum course has been Unremarkable. Baby's course has been Unremarkable. Baby is feeding by breast. Bleeding no bleeding. Bowel function is abnormal: constipation. Bladder function is normal. Patient is not sexually active. Contraception method is none. Postpartum depression screening: negative.  Reports hemorrhoids.  States has a vaginal odor, denies itching or urinary problems.  Pelvic exam declined.   The following portions of the patient's history were reviewed and updated as appropriate: allergies, current medications, past family history, past medical history, past social history, past surgical history and problem list.  Review of Systems Pertinent items noted in HPI and remainder of comprehensive ROS otherwise negative.   Objective:    BP 109/71   Pulse 74   Ht 5\' 1"  (1.549 m)   Wt 211 lb 6.4 oz (95.9 kg)   Breastfeeding? Yes   BMI 39.94 kg/m   General:  alert, cooperative and no distress   Breasts:  inspection negative, no nipple discharge or bleeding, no masses or nodularity palpable  Lungs: clear to auscultation bilaterally  Heart:  regular rate and rhythm, S1, S2 normal, no murmur, click, rub or gallop  Abdomen: soft, non-tender; bowel sounds normal; no masses,  no organomegaly  Pelvic/Rectal Exam: Not performed.        Assessment:     Normal 4 week postpartum exam. Pap smear not done at today's visit.    Vaginal discharge   Hemorrhoids   Breast feeding status: excellent  Plan:    1. Contraception: abstinence and condoms 2. Hemorrhoids discussed. OTC medications recommended.  Urine cytology for BV.  3. Follow up in: 1 year or as needed.

## 2017-09-17 ENCOUNTER — Telehealth: Payer: Self-pay | Admitting: *Deleted

## 2017-09-17 NOTE — Telephone Encounter (Signed)
[facility], urine sample was not able to run (expired sample). Call placed to pt. Pt made aware of problem. Pt advised she may come by office and leave urine sample for recollection. Pt states understanding and will try to come by tomorrow.

## 2018-04-10 NOTE — L&D Delivery Note (Signed)
Delivery Note At 4:22 PM a viable female was delivered via  (Presentation: ROA).  APGAR: 9, 9; weight pending.   Placenta status: spontaneous, intact.  Cord:  3 vessels with the following complications: noted to be short  Anesthesia:  epidural Episiotomy:  n/a Lacerations:  none Suture Repair: none Est. Blood Loss (mL):  20  Mom to postpartum.  Baby to Couplet care / Skin to Skin.  Wende Mott CNM 01/11/2019, 4:39 PM

## 2018-05-23 IMAGING — US US MFM OB FOLLOW-UP
1 series · 12 of 26 positions shown · non-contrast
Comparison: none

[Series 1: us mfm ob follow-up · 26 acquisitions, 12 frames shown]
[im 2/26]
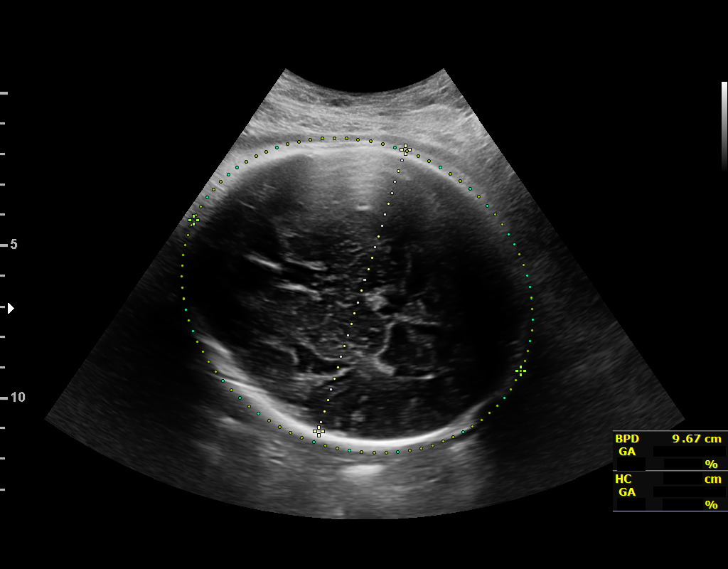
[im 4/26]
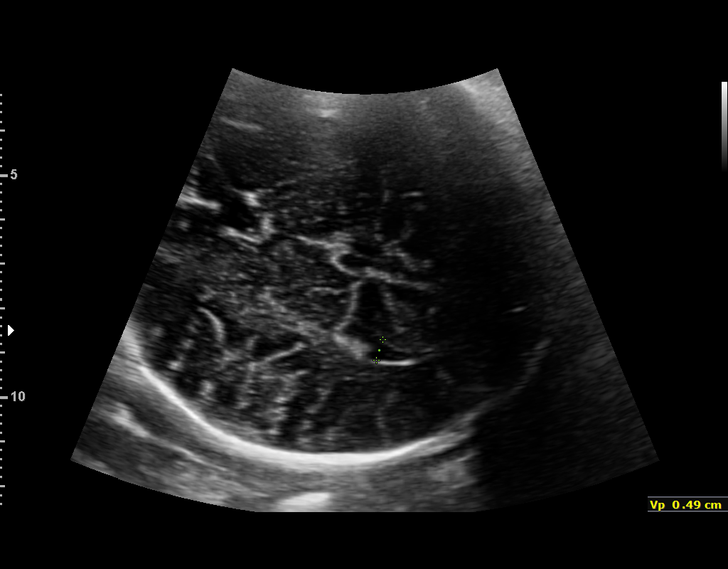
[im 6/26]
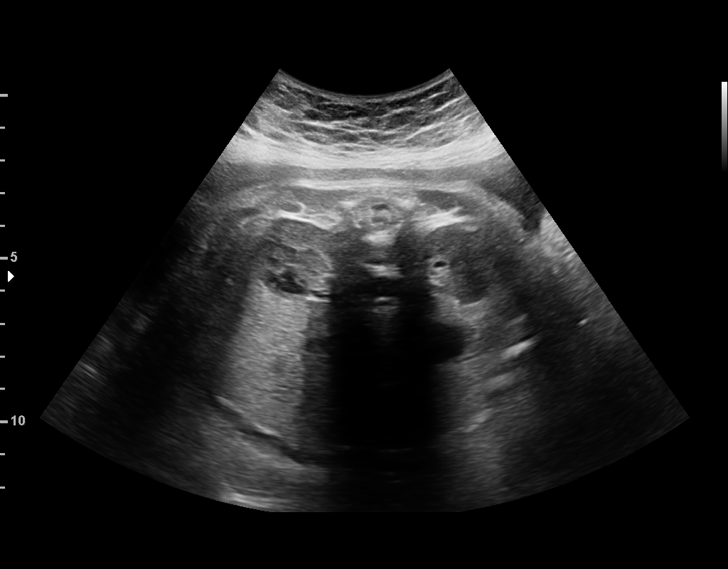
[im 8/26]
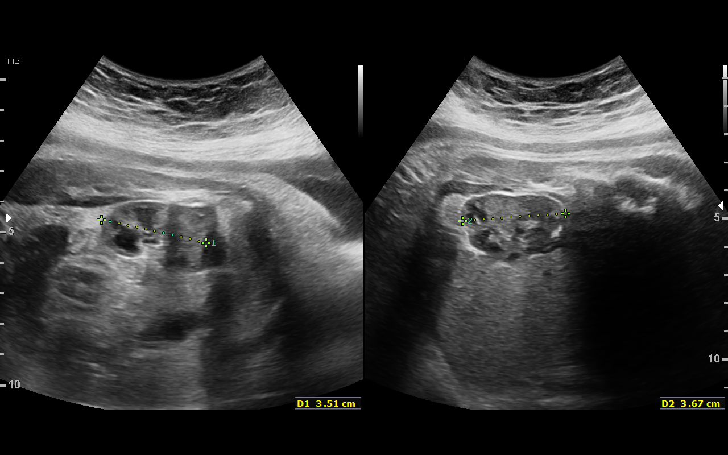
[im 10/26]
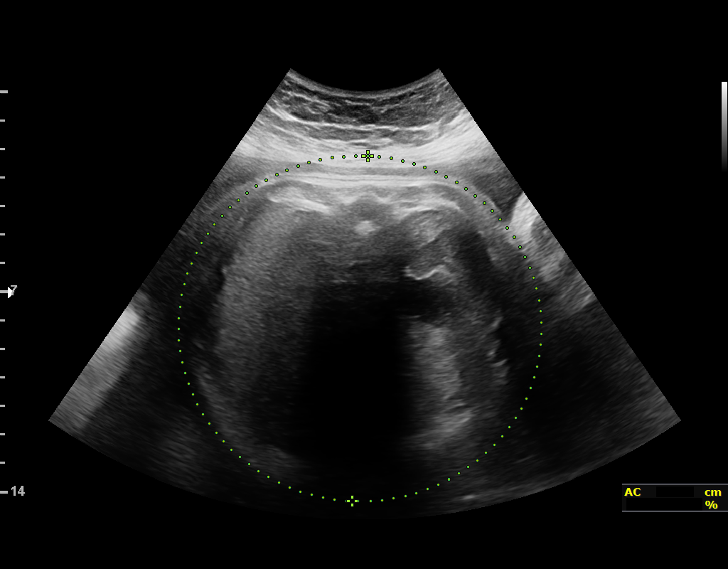
[im 12/26]
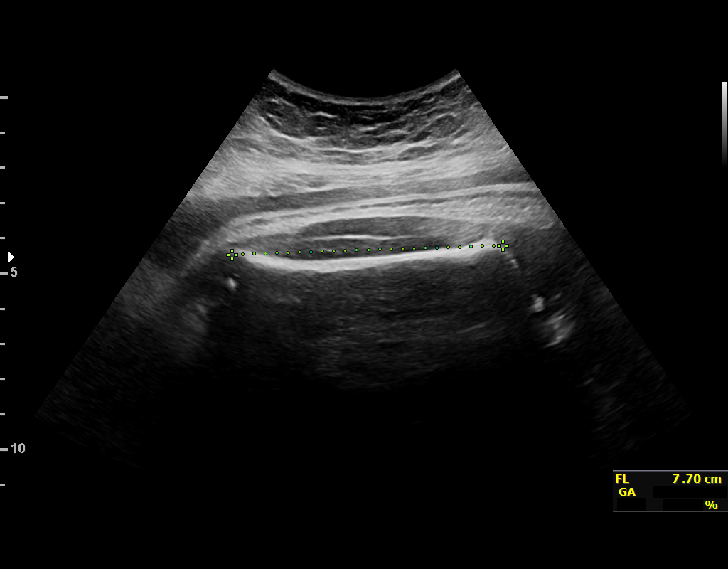
[im 15/26]
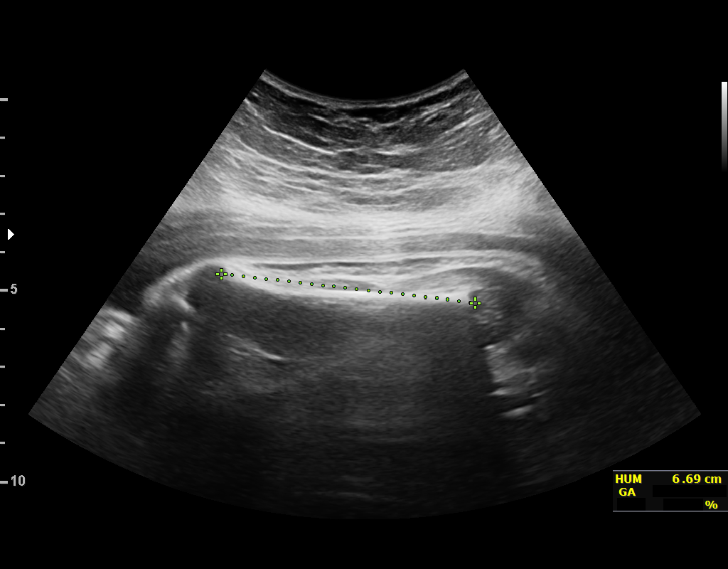
[im 17/26]
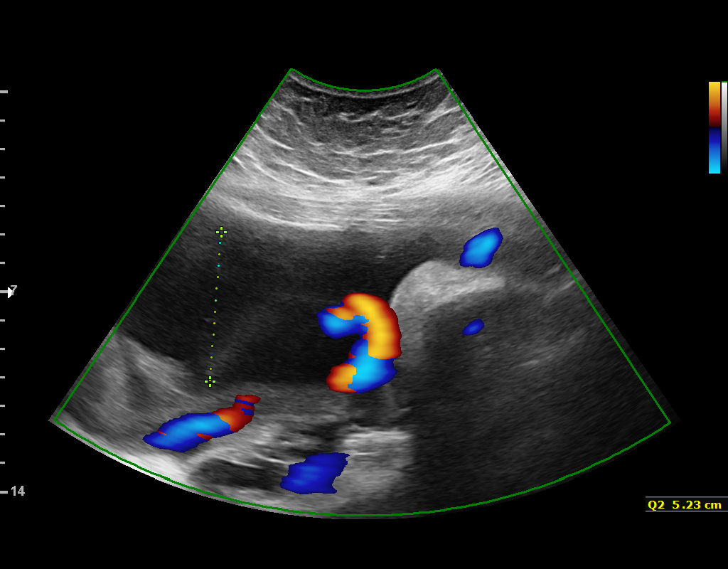
[im 19/26]
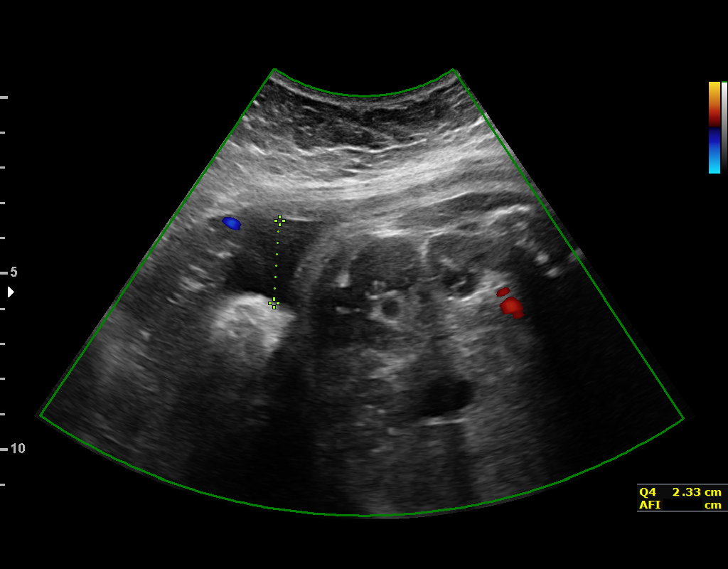
[im 21/26]
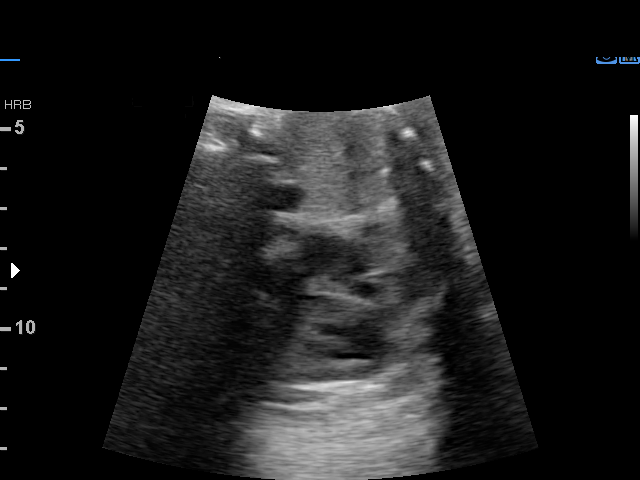
[im 23/26]
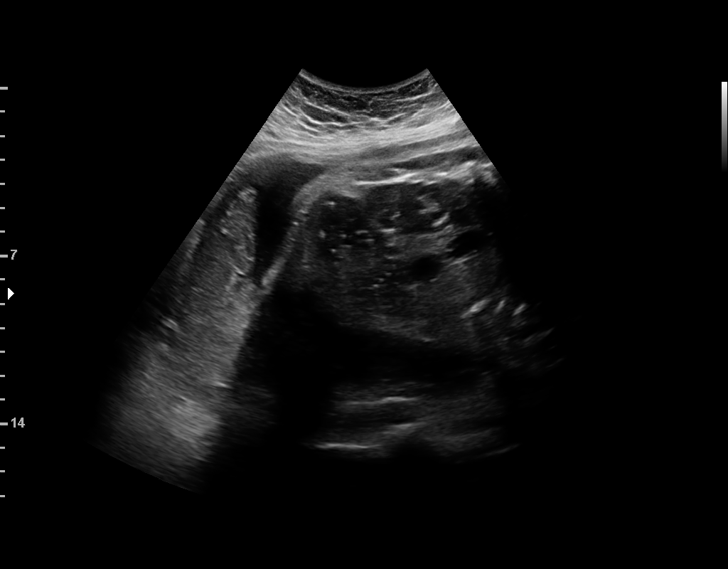
[im 25/26]
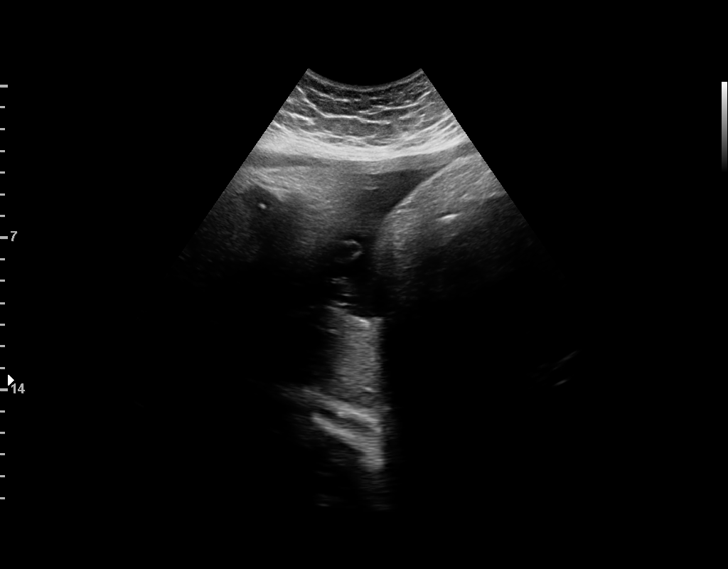

[12 of 26 positions shown; findings below may reference images not displayed]

Road [HOSPITAL]

1  MELQUISIDED VARGS              [id number]      [id number]     [id number]
Indications

39 weeks gestation of pregnancy
Obesity complicating pregnancy, third
trimester
Asthma                                         NSS.1S j97.262
Poor obstetric history: Previous gestational
diabetes
History of sickle cell trait
Previous pregnacy with congenital heart
(cardiac) defect (TAPVR)
Large for gestational age fetus affecting
management of mother
OB History

Blood Type:            Height:  5'2"   Weight (lb):  195       BMI:
Gravidity:    2         Term:   1        Prem:   0        SAB:   0
TOP:          0       Ectopic:  0        Living: 1
Fetal Evaluation

Num Of Fetuses:     1
Fetal Heart         133
Rate(bpm):
Cardiac Activity:   Observed
Presentation:       Cephalic
Placenta:           Posterior, above cervical os
P. Cord Insertion:  Previously Visualized

Amniotic Fluid
AFI FV:      Subjectively within normal limits

AFI Sum(cm)     %Tile       Largest Pocket(cm)
11.66           42
RUQ(cm)       RLQ(cm)       LUQ(cm)        LLQ(cm)
4.1           2.33          5.23           0
Biometry

BPD:      96.9  mm     G. Age:  39w 5d         89  %    CI:        79.79   %    70 - 86
FL/HC:      22.5   %    20.6 -
HC:      342.8  mm     G. Age:  39w 4d         47  %    HC/AC:      0.88        0.87 -
AC:      391.1  mm     G. Age:  N/A          > 97  %    FL/BPD:     79.7   %    71 - 87
FL:       77.2  mm     G. Age:  39w 4d         63  %    FL/AC:      19.7   %    20 - 24
HUM:      66.9  mm     G. Age:  38w 6d         68  %

Est. FW:    7737  gm    9 lb 12 oz    > 90  %
Gestational Age

LMP:           39w 2d        Date:  11/11/16                 EDD:   08/18/17
U/S Today:     39w 4d                                        EDD:   08/16/17
Best:          39w 2d     Det. By:  LMP  (11/11/16)          EDD:   08/18/17
Anatomy

Cranium:               Appears normal         Aortic Arch:            Previously seen
Cavum:                 Appears normal         Ductal Arch:            Previously seen
Ventricles:            Appears normal         Diaphragm:              Previously seen
Choroid Plexus:        Previously seen        Stomach:                Appears normal, left
sided
Cerebellum:            Previously seen        Abdomen:                Appears normal
Posterior Fossa:       Previously seen        Abdominal Wall:         Previously seen
Nuchal Fold:           Previously seen        Cord Vessels:           Previously seen
Face:                  Profile nl; orbits     Kidneys:                Appear normal
previously seen
Lips:                  Previously seen        Bladder:                Appears normal
Thoracic:              Appears normal         Spine:                  Previously seen
Heart:                 Previously seen        Upper Extremities:      Previously seen
RVOT:                  Previously seen        Lower Extremities:      Previously seen
LVOT:                  Previously seen

Other:  Fetus appears to be a male.Heels and 5th digit previously seen.
Cervix Uterus Adnexa

Cervix
Not visualized (advanced GA >65wks)
Impression

Singleton intrauterine pregnancy at 39+2 weeks with
size>dates here for growth evaluation
Interval review of the anatomy shows no sonographic
markers for aneuploidy or structural anomalies
All relevant fetal anatomy has been visualized
Amniotic fluid volume is normal
Estimated fetal weight shows growth at >90th percentile with
EFW 4429g
Recommendations

EFW is macrosomic and nearly 4500g. To avoid potential
birth trauma, ACOG recommends that offering a primary
cesarean delivery be limited to estimated fetal weights of at
least 5,000 g in women without diabetes and at least 4,500 g
in women with diabetes. I have taken the liberty of getting a
9gbPY5 today. If it is <6.0 I would call this patient very low
risk for gestational diabet and proceed with attempted vaginal
delivery, with the understanding that there is an elevated risk
for shoulder dystocia and subsequent birth trauma. I advised
her to make an appointment in the clinic immediately for a
discussion of her options and her wishes concerning delivery
choice

## 2018-08-27 ENCOUNTER — Ambulatory Visit (HOSPITAL_COMMUNITY)
Admission: EM | Admit: 2018-08-27 | Discharge: 2018-08-27 | Disposition: A | Discharge status: Home / Self Care | Payer: Medicaid Other | Attending: Family Medicine | Admitting: Family Medicine

## 2018-08-27 ENCOUNTER — Telehealth (HOSPITAL_COMMUNITY): Payer: Self-pay | Admitting: Emergency Medicine

## 2018-08-27 ENCOUNTER — Encounter (HOSPITAL_COMMUNITY): Payer: Self-pay | Admitting: Emergency Medicine

## 2018-08-27 DIAGNOSIS — Z3201 Encounter for pregnancy test, result positive: Secondary | ICD-10-CM | POA: Diagnosis not present

## 2018-08-27 LAB — POCT URINALYSIS DIP (DEVICE)
Glucose, UA: NEGATIVE mg/dL
Hgb urine dipstick: NEGATIVE
Ketones, ur: NEGATIVE mg/dL
Nitrite: NEGATIVE
Protein, ur: NEGATIVE mg/dL
Specific Gravity, Urine: 1.025 (ref 1.005–1.030)
Urobilinogen, UA: 1 mg/dL (ref 0.0–1.0)
pH: 6.5 (ref 5.0–8.0)

## 2018-08-27 LAB — HCG, QUANTITATIVE, PREGNANCY: hCG, Beta Chain, Quant, S: 20830 m[IU]/mL — ABNORMAL HIGH (ref <5)

## 2018-08-27 LAB — POCT PREGNANCY, URINE: Preg Test, Ur: POSITIVE — AB

## 2018-08-27 NOTE — ED Triage Notes (Signed)
Pt states she took a pregnancy test in Jan and it was positive but has not follow uped with with anyone since. States shes not had her period since [place].

## 2018-08-27 NOTE — ED Provider Notes (Signed)
[facility]    CSN: [id number] Arrival date & time: 08/27/18  1333     History   Chief Complaint Chief Complaint  Patient presents with  . Possible Pregnancy    HPI Erin Wilkinson is a 23 y.o. female.   Pt is a 23 year old female that presents wanting pregnancy test. Reporting that she had a positive test in January of this year. She has not sought care since. Reporting some nausea at times. Recent pregnancy in May  of 17. She contacted her OB and they wanted confirmed pregnancy test. Otherwise she is okay and not having any vaginal bleeding or abdominal pain.   ROS per HPI      Past Medical History:  Diagnosis Date  . Asthma   . Dyspnea   . Gestational diabetes   . Gestational diabetes mellitus in pregnancy    new diagnosis 01/2011  . Headache(784.0)   . History of chronic bronchitis   . History of depression   . Syncope     Patient Active Problem List   Diagnosis Date Noted  . Anemia of mother in pregnancy 06/01/2017  . 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 Sickle cell trait (HCC) 02/26/2017  . Rubella non-immune status, antepartum 02/26/2017  . Asthma 02/19/2017  . Obesity (BMI 30-39.9) 08/04/2013    Past Surgical History:  Procedure Laterality Date  . NO PAST SURGERIES      OB History    Gravida  2   Para  2   Term  2   Preterm      AB      Living  2     SAB      TAB      Ectopic      Multiple  0   Live Births  2            Home Medications    Prior to Admission medications   Medication Sig Start Date End Date Taking? Authorizing Provider  Prenat-FeAsp-Meth-FA-DHA w/o A (PRENATE PIXIE) 10-0.6-0.4-200 MG CAPS Take 1 tablet by mouth daily. 09/13/17   Roe Coombs, CNM    Family History Family History  Problem Relation Age of Onset  . Asthma Mother   . Depression Mother   . Miscarriages / [place] Mother   . Cancer Mother   . Cancer Maternal Uncle   . Diabetes  Maternal Grandmother   . Hypertension Maternal Grandmother   . Stroke Paternal Grandmother   . Miscarriages / Stillbirths Sister   . Heart disease Paternal Aunt   . Heart disease Paternal Uncle     Social History Social History   Tobacco Use  . Smoking status: Never Smoker  . Smokeless tobacco: Never Used  Substance Use Topics  . Alcohol use: No    Alcohol/week: 0.0 standard drinks  . Drug use: No     Allergies   Patient has no known allergies.   Review of Systems Review of Systems   Physical Exam Triage Vital Signs ED Triage Vitals  Enc Vitals Group     BP 08/27/18 1359 104/67     Pulse Rate 08/27/18 1359 98     Resp 08/27/18 1359 16     Temp 08/27/18 1359 98.5 F (36.9 C)     Temp src --      SpO2 08/27/18 1359 100 %     Weight --      Height --  Head Circumference --      Peak Flow --      Pain Score 08/27/18 1356 0     Pain Loc --      Pain Edu? --      Excl. in GC? --    No data found.  Updated Vital Signs BP 104/67   Pulse 98   Temp 98.5 F (36.9 C)   Resp 16   SpO2 100%   Visual Acuity Right Eye Distance:   Left Eye Distance:   Bilateral Distance:    Right Eye Near:   Left Eye Near:    Bilateral Near:     Physical Exam Vitals signs and nursing note reviewed.  Constitutional:      General: She is not in acute distress.    Appearance: Normal appearance. She is not ill-appearing, toxic-appearing or diaphoretic.  HENT:     Head: Normocephalic.     Nose: Nose normal.     Mouth/Throat:     Pharynx: Oropharynx is clear.  Eyes:     Conjunctiva/sclera: Conjunctivae normal.  Neck:     Musculoskeletal: Normal range of motion.  Pulmonary:     Effort: Pulmonary effort is normal.  Musculoskeletal: Normal range of motion.  Skin:    General: Skin is warm and dry.     Findings: No rash.  Neurological:     Mental Status: She is alert.  Psychiatric:        Mood and Affect: Mood normal.      UC Treatments / Results  Labs (all  labs ordered are listed, but only abnormal results are displayed) Labs Reviewed  HCG, QUANTITATIVE, PREGNANCY - Abnormal; Notable for the following components:      Result Value   hCG, Beta Chain, Quant, S 20,830 (*)    All other components within normal limits  POCT URINALYSIS DIP (DEVICE) - Abnormal; Notable for the following components:   Bilirubin Urine SMALL (*)    Leukocytes,Ua TRACE (*)    All other components within normal limits  POCT PREGNANCY, URINE - Abnormal; Notable for the following components:   Preg Test, Ur POSITIVE (*)    All other components within normal limits  URINE CULTURE  POC URINE PREG, ED    EKG None  Radiology No results found.  Procedures Procedures (including critical care time)  Medications Ordered in UC Medications - No data to display  Initial Impression / Assessment and Plan / UC Course  I have reviewed the triage vital signs and the nursing notes.  Pertinent labs & imaging results that were available during my care of the patient were reviewed by me and considered in my medical decision making (see chart for details).     Pt is a 23 year old female with recent pregnancy in May of 2019. She took a pregnancy test in January that was positive. She has not seen her OB. She is approx 5 months pregnant with no pre natal care.  HCG almost 20,000 No concerning signs or symptoms.  She plans to follow up with her OB next week.  Final Clinical Impressions(s) / UC Diagnoses   Final diagnoses:  Positive pregnancy test     Discharge Instructions     Your urine pregnancy was positive here.  We are going to do a blood test. You need to follow-up with your OB for ultrasound and prenatal care. Make sure you are taking a prenatal vitamin    ED Prescriptions    None  Controlled Substance Prescriptions [place] Controlled Substance Registry consulted? Not Applicable   Janace Aris, NP 08/28/18 [phone]

## 2018-08-27 NOTE — Telephone Encounter (Signed)
Results called to patient, all questions answered.

## 2018-08-27 NOTE — Discharge Instructions (Addendum)
Your urine pregnancy was positive here.  We are going to do a blood test. You need to follow-up with your OB for ultrasound and prenatal care. Make sure you are taking a prenatal vitamin

## 2018-08-28 LAB — URINE CULTURE

## 2018-08-29 ENCOUNTER — Encounter (HOSPITAL_COMMUNITY): Payer: Self-pay | Admitting: *Deleted

## 2018-08-29 ENCOUNTER — Other Ambulatory Visit: Payer: Self-pay

## 2018-08-29 ENCOUNTER — Inpatient Hospital Stay (HOSPITAL_COMMUNITY)
Admission: AD | Admit: 2018-08-29 | Discharge: 2018-08-29 | Disposition: A | Discharge status: Home / Self Care | Payer: Medicaid Other | Attending: Family Medicine | Admitting: Family Medicine

## 2018-08-29 DIAGNOSIS — O26892 Other specified pregnancy related conditions, second trimester: Secondary | ICD-10-CM

## 2018-08-29 DIAGNOSIS — O0932 Supervision of pregnancy with insufficient antenatal care, second trimester: Secondary | ICD-10-CM

## 2018-08-29 DIAGNOSIS — M7918 Myalgia, other site: Secondary | ICD-10-CM

## 2018-08-29 DIAGNOSIS — O99512 Diseases of the respiratory system complicating pregnancy, second trimester: Secondary | ICD-10-CM | POA: Insufficient documentation

## 2018-08-29 DIAGNOSIS — O9989 Other specified diseases and conditions complicating pregnancy, childbirth and the puerperium: Secondary | ICD-10-CM | POA: Insufficient documentation

## 2018-08-29 DIAGNOSIS — Z809 Family history of malignant neoplasm, unspecified: Secondary | ICD-10-CM | POA: Insufficient documentation

## 2018-08-29 DIAGNOSIS — Z8632 Personal history of gestational diabetes: Secondary | ICD-10-CM | POA: Diagnosis not present

## 2018-08-29 DIAGNOSIS — Z79899 Other long term (current) drug therapy: Secondary | ICD-10-CM | POA: Insufficient documentation

## 2018-08-29 DIAGNOSIS — Z825 Family history of asthma and other chronic lower respiratory diseases: Secondary | ICD-10-CM | POA: Diagnosis not present

## 2018-08-29 DIAGNOSIS — Z3A24 24 weeks gestation of pregnancy: Secondary | ICD-10-CM | POA: Insufficient documentation

## 2018-08-29 DIAGNOSIS — Z3A23 23 weeks gestation of pregnancy: Secondary | ICD-10-CM | POA: Diagnosis not present

## 2018-08-29 DIAGNOSIS — R109 Unspecified abdominal pain: Secondary | ICD-10-CM

## 2018-08-29 DIAGNOSIS — Z833 Family history of diabetes mellitus: Secondary | ICD-10-CM | POA: Insufficient documentation

## 2018-08-29 DIAGNOSIS — J45909 Unspecified asthma, uncomplicated: Secondary | ICD-10-CM | POA: Diagnosis not present

## 2018-08-29 DIAGNOSIS — Z362 Encounter for other antenatal screening follow-up: Secondary | ICD-10-CM

## 2018-08-29 DIAGNOSIS — O212 Late vomiting of pregnancy: Secondary | ICD-10-CM | POA: Diagnosis not present

## 2018-08-29 DIAGNOSIS — O219 Vomiting of pregnancy, unspecified: Secondary | ICD-10-CM

## 2018-08-29 LAB — WET PREP, GENITAL
Clue Cells Wet Prep HPF POC: NONE SEEN
Sperm: NONE SEEN
Trich, Wet Prep: NONE SEEN

## 2018-08-29 LAB — COMPREHENSIVE METABOLIC PANEL
ALT: 10 U/L (ref 0–44)
AST: 12 U/L — ABNORMAL LOW (ref 15–41)
Albumin: 2.9 g/dL — ABNORMAL LOW (ref 3.5–5.0)
Alkaline Phosphatase: 78 U/L (ref 38–126)
Anion gap: 9 (ref 5–15)
BUN: 5 mg/dL — ABNORMAL LOW (ref 6–20)
CO2: 20 mmol/L — ABNORMAL LOW (ref 22–32)
Calcium: 9 mg/dL (ref 8.9–10.3)
Chloride: 106 mmol/L (ref 98–111)
Creatinine, Ser: 0.62 mg/dL (ref 0.44–1.00)
GFR calc Af Amer: >60 mL/min (ref >60)
GFR calc non Af Amer: >60 mL/min (ref >60)
Glucose, Bld: 78 mg/dL (ref 70–99)
Potassium: 3.6 mmol/L (ref 3.5–5.1)
Sodium: 135 mmol/L (ref 135–145)
Total Bilirubin: 0.7 mg/dL (ref 0.3–1.2)
Total Protein: 5.8 g/dL — ABNORMAL LOW (ref 6.5–8.1)

## 2018-08-29 LAB — URINALYSIS, ROUTINE W REFLEX MICROSCOPIC
Bilirubin Urine: NEGATIVE
Glucose, UA: NEGATIVE mg/dL
Hgb urine dipstick: NEGATIVE
Ketones, ur: 20 mg/dL — AB
Nitrite: NEGATIVE
Protein, ur: NEGATIVE mg/dL
Specific Gravity, Urine: 1.025 (ref 1.005–1.030)
pH: 5 (ref 5.0–8.0)

## 2018-08-29 LAB — CBC
HCT: 33.7 % — ABNORMAL LOW (ref 36.0–46.0)
Hemoglobin: 11.2 g/dL — ABNORMAL LOW (ref 12.0–15.0)
MCH: 24.8 pg — ABNORMAL LOW (ref 26.0–34.0)
MCHC: 33.2 g/dL (ref 30.0–36.0)
MCV: 74.6 fL — ABNORMAL LOW (ref 80.0–100.0)
Platelets: 272 10*3/uL (ref 150–400)
RBC: 4.52 MIL/uL (ref 3.87–5.11)
RDW: 14.1 % (ref 11.5–15.5)
WBC: 11.9 10*3/uL — ABNORMAL HIGH (ref 4.0–10.5)
nRBC: 0 % (ref 0.0–0.2)

## 2018-08-29 LAB — POCT PREGNANCY, URINE: Preg Test, Ur: POSITIVE — AB

## 2018-08-29 MED ORDER — PROMETHAZINE HCL 12.5 MG PO TABS: 12.5000 mg | ORAL_TABLET | Freq: Every evening | ORAL | 0 refills | Status: DC | PRN

## 2018-08-29 MED ORDER — METOCLOPRAMIDE HCL 10 MG PO TABS: 10.0000 mg | ORAL_TABLET | Freq: Three times a day (TID) | ORAL | 0 refills | Status: DC

## 2018-08-29 MED ORDER — PROMETHAZINE HCL 25 MG/ML IJ SOLN
12.5000 mg | Freq: Once | INTRAMUSCULAR | Status: AC
  Administered 2018-08-29: 12.5 mg via INTRAVENOUS
  Filled 2018-08-29: qty 1

## 2018-08-29 MED ORDER — FAMOTIDINE IN NACL 20-0.9 MG/50ML-% IV SOLN
20.0000 mg | Freq: Once | INTRAVENOUS | Status: AC
  Administered 2018-08-29: 20 mg via INTRAVENOUS
  Filled 2018-08-29: qty 50

## 2018-08-29 MED ORDER — LACTATED RINGERS IV BOLUS
1000.0000 mL | Freq: Once | INTRAVENOUS | Status: AC
  Administered 2018-08-29: 1000 mL via INTRAVENOUS
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
## 2018-08-29 NOTE — MAU Provider Note (Signed)
Chief Complaint:  Flank Pain and Emesis   First Provider Initiated Contact with Patient 08/29/18 1920      HPI: Erin Wilkinson is a 23 y.o. G3P2002 at [redacted]w[redacted]d by LMP who presents to maternity admissions reporting n/v and right lower abdominal pain x 2 weeks.She reports that 2 weeks ago she bent over in the car to reach for something and felt pain in her right lower abdomen. She has had pain in the same location x 2 weeks that is intermittent and sharp. Movement makes it worse.  She has n/v with emesis 1-2 times every 24 hours. She has not tried any treatments. There are no other symptoms. She is sure of her LMP but has irregular periods and was told she needed a due date to make an appointment with her OB/Gyn. She went to Urgent care and was told she was approximately 4 weeks based on her hcg level.  She is feeling fetal movement and thinks she is farther along than that.    HPI  Past Medical History: Past Medical History:  Diagnosis Date  . Asthma   . Dyspnea   . Gestational diabetes   . Gestational diabetes mellitus in pregnancy    new diagnosis 01/2011  . Headache(784.0)   . History of chronic bronchitis   . History of depression   . Syncope     Past obstetric history: OB History  Gravida Para Term Preterm AB Living  SAB TAB Ectopic Multiple Live Births        0 2    # Outcome Date GA Lbr Len/2nd Weight Sex Delivery Anes PTL Lv  3 Current           2 Term 08/15/17 [redacted]w[redacted]d / 00:42 3410 g M Vag-Spont EPI  LIV  1 Term 02/20/11 [redacted]w[redacted]d 09:45 / 00:28 3280 g M Vag-Spont EPI  LIV    Past Surgical History: Past Surgical History:  Procedure Laterality Date  . NO PAST SURGERIES      Family History: Family History  Problem Relation Age of Onset  . Asthma Mother   . Depression Mother   . Miscarriages / [place] Mother   . Cancer Mother   . Cancer Maternal Uncle   . Diabetes Maternal Grandmother   . Hypertension Maternal Grandmother   . Stroke Paternal  Grandmother   . Miscarriages / Stillbirths Sister   . Heart disease Paternal Aunt   . Heart disease Paternal Uncle     Social History: Social History   Tobacco Use  . Smoking status: Never Smoker  . Smokeless tobacco: Never Used  Substance Use Topics  . Alcohol use: No    Alcohol/week: 0.0 standard drinks  . Drug use: No    Allergies: No Known Allergies  Meds:  No medications prior to admission.    ROS:  Review of Systems  Constitutional: Negative for chills, fatigue and fever.  Eyes: Negative for visual disturbance.  Respiratory: Negative for shortness of breath.   Cardiovascular: Negative for chest pain.  Gastrointestinal: Positive for abdominal pain, nausea and vomiting.  Genitourinary: Negative for difficulty urinating, dysuria, flank pain, pelvic pain, vaginal bleeding, vaginal discharge and vaginal pain.  Neurological: Negative for dizziness and headaches.  Psychiatric/Behavioral: Negative.      I have reviewed patient's Past Medical Hx, Surgical Hx, Family Hx, Social Hx, medications and allergies.   Physical Exam   Patient Vitals for the past 24 hrs:  BP Temp Temp  src Pulse Resp Height Weight  08/29/18 2159 (!) 113/58 - - - - - -  08/29/18 1735 112/64 98.4 F (36.9 C) Oral 87 18 5\' 1"  (1.549 m) 103.4 kg   Constitutional: Well-developed, well-nourished female in no acute distress.  Cardiovascular: normal rate Respiratory: normal effort GI: Abd soft, non-tender, gravid appropriate for gestational age.  MS: Extremities nontender, no edema, normal ROM Neurologic: Alert and oriented x 4.  GU: Neg CVAT.  PELVIC EXAM: Cervix pink, visually closed, without lesion, scant white creamy discharge, vaginal walls and external genitalia normal Bimanual exam: Cervix 0/long/high, firm, anterior, neg CMT, uterus nontender, nonenlarged, adnexa without tenderness, enlargement, or mass     FHT:  Baseline 155 , moderate variability, no accels or decels  noted Contractions: None on toco or to palpation   Labs: Results for orders placed or performed during the hospital encounter of 08/29/18 (from the past 24 hour(s))  Urinalysis, Routine w reflex microscopic     Status: Abnormal   Collection Time: 08/29/18  5:36 PM  Result Value Ref Range   Color, Urine AMBER (A) YELLOW   APPearance CLOUDY (A) CLEAR   Specific Gravity, Urine 1.025 1.005 - 1.030   pH 5.0 5.0 - 8.0   Glucose, UA NEGATIVE NEGATIVE mg/dL   Hgb urine dipstick NEGATIVE NEGATIVE   Bilirubin Urine NEGATIVE NEGATIVE   Ketones, ur 20 (A) NEGATIVE mg/dL   Protein, ur NEGATIVE NEGATIVE mg/dL   Nitrite NEGATIVE NEGATIVE   Leukocytes,Ua MODERATE (A) NEGATIVE   RBC / HPF 0-5 0 - 5 RBC/hpf   WBC, UA 6-10 0 - 5 WBC/hpf   Bacteria, UA MANY (A) NONE SEEN   Squamous Epithelial / LPF 6-10 0 - 5   Mucus PRESENT    Budding Yeast PRESENT   Pregnancy, urine POC     Status: Abnormal   Collection Time: 08/29/18  5:37 PM  Result Value Ref Range   Preg Test, Ur POSITIVE (A) NEGATIVE  CBC     Status: Abnormal   Collection Time: 08/29/18  7:42 PM  Result Value Ref Range   WBC 11.9 (H) 4.0 - 10.5 K/uL   RBC 4.52 3.87 - 5.11 MIL/uL   Hemoglobin 11.2 (L) 12.0 - 15.0 g/dL   HCT 16.133.7 (L) 09.636.0 - 04.546.0 %   MCV 74.6 (L) 80.0 - 100.0 fL   MCH 24.8 (L) 26.0 - 34.0 pg   MCHC 33.2 30.0 - 36.0 g/dL   RDW 40.914.1 81.111.5 - 91.415.5 %   Platelets 272 150 - 400 K/uL   nRBC 0.0 0.0 - 0.2 %  Comprehensive metabolic panel     Status: Abnormal   Collection Time: 08/29/18  7:42 PM  Result Value Ref Range   Sodium 135 135 - 145 mmol/L   Potassium 3.6 3.5 - 5.1 mmol/L   Chloride 106 98 - 111 mmol/L   CO2 20 (L) 22 - 32 mmol/L   Glucose, Bld 78 70 - 99 mg/dL   BUN 5 (L) 6 - 20 mg/dL   Creatinine, Ser 7.820.62 0.44 - 1.00 mg/dL   Calcium 9.0 8.9 - 95.610.3 mg/dL   Total Protein 5.8 (L) 6.5 - 8.1 g/dL   Albumin 2.9 (L) 3.5 - 5.0 g/dL   AST 12 (L) 15 - 41 U/L   ALT 10 0 - 44 U/L   Alkaline Phosphatase 78 38 - 126 U/L    Total Bilirubin 0.7 0.3 - 1.2 mg/dL   GFR calc non Af Amer >60 >60 mL/min  GFR calc Af Amer >60 >60 mL/min   Anion gap 9 5 - 15  Wet prep, genital     Status: Abnormal   Collection Time: 08/29/18  8:36 PM  Result Value Ref Range   Yeast Wet Prep HPF POC PRESENT (A) NONE SEEN   Trich, Wet Prep NONE SEEN NONE SEEN   Clue Cells Wet Prep HPF POC NONE SEEN NONE SEEN   WBC, Wet Prep HPF POC MANY (A) NONE SEEN   Sperm NONE SEEN       Imaging:  Pt informed that the ultrasound is considered a limited OB ultrasound and is not intended to be a complete ultrasound exam.  Patient also informed that the ultrasound is not being completed with the intent of assessing for fetal or placental anomalies or any pelvic abnormalities.  Explained that the purpose of today's ultrasound is to assess for  viability.  Patient acknowledges the purpose of the exam and the limitations of the study.    Limited OB US Date: 08/29/18 EDD : 12/16/18  based on LMP Viability:  FHT detected BPD measurement c/w [redacted]w[redacted]d   MAU Course/MDM: Orders Placed This Encounter  Procedures  . Wet prep, genital  . OB Urine Culture  . [place] MFM OB COMP + 14 WK  . Urinalysis, Routine w reflex microscopic  . CBC  . Comprehensive metabolic panel  . Pregnancy, urine POC  . Discharge patient    Meds ordered this encounter  Medications  . lactated ringers bolus 1,000 mL  . promethazine (PHENERGAN) injection 12.5 mg  . famotidine (PEPCID) IVPB 20 mg premix  . metoCLOPramide (REGLAN) 10 MG tablet    Sig: Take 1 tablet (10 mg total) by mouth 3 (three) times daily before meals.    Dispense:  30 tablet    Refill:  0    Order Specific Question:   Supervising Provider    Answer:   Reva Bores [[id number]]  . promethazine (PHENERGAN) 12.5 MG tablet    Sig: Take 1 tablet (12.5 mg total) by mouth at bedtime as needed for nausea or vomiting.    Dispense:  30 tablet    Refill:  0    Order Specific Question:   Supervising Provider    Answer:    Reva Bores [[id number]]     NST reviewed.  Difficult to obtain tracing and fundal height and bedside US c/w 23 weeks so may be unable to trace due to early gestation. Limited monitoring is appropriate for gestational age. No rebound tenderness, no acute abdomen, pain c/w musculoskeletal pain Urine sent for culture Pt with EDD based on LMP, bedside US c/w these dates Anatomy US ordered  Message to [facility] to establish care with current EDD Rest/ice/heat/Tylenol for abdominal pain.  Note for pt to miss work tomorrow. Return to MAU as needed for emergencies Pt discharge with strict return precautions.    Assessment: 1. Late prenatal care affecting pregnancy in second trimester   2. Abdominal pain during pregnancy in second trimester   3. Musculoskeletal pain   4. Nausea and vomiting during pregnancy   5. [redacted] weeks gestation of pregnancy     Plan: Discharge home Labor precautions and fetal kick counts Follow-up Information    [facility] Follow up.   Why:  The office will contact you with soonest available appointment.  Maternal Fetal Medicine will call you with ultrasound appointment. Return to MAU as needed for emergencies.  Contact information: [address]  [place] [address] [phone]         Allergies as of 08/29/2018   No Known Allergies     Medication List    TAKE these medications   metoCLOPramide 10 MG tablet Commonly known as:  REGLAN Take 1 tablet (10 mg total) by mouth 3 (three) times daily before meals.   Prenate Pixie 10-0.6-0.4-200 MG Caps Take 1 tablet by mouth daily.   promethazine 12.5 MG tablet Commonly known as:  PHENERGAN Take 1 tablet (12.5 mg total) by mouth at bedtime as needed for nausea or vomiting.       Sharen Counter Certified Nurse-Midwife 08/29/2018 10:57 PM

## 2018-08-30 LAB — GC/CHLAMYDIA PROBE AMP (~~LOC~~) NOT AT ARMC
Chlamydia: NEGATIVE
Neisseria Gonorrhea: NEGATIVE

## 2018-09-04 ENCOUNTER — Other Ambulatory Visit (HOSPITAL_COMMUNITY)
Admission: RE | Admit: 2018-09-04 | Discharge: 2018-09-04 | Disposition: A | Discharge status: Home / Self Care | Payer: Medicaid Other | Source: Ambulatory Visit | Attending: Advanced Practice Midwife | Admitting: Advanced Practice Midwife

## 2018-09-04 ENCOUNTER — Other Ambulatory Visit: Payer: Self-pay

## 2018-09-04 ENCOUNTER — Ambulatory Visit (INDEPENDENT_AMBULATORY_CARE_PROVIDER_SITE_OTHER): Payer: Medicaid Other | Admitting: Advanced Practice Midwife

## 2018-09-04 ENCOUNTER — Encounter: Payer: Self-pay | Admitting: Advanced Practice Midwife

## 2018-09-04 VITALS — BP 108/64 | HR 87 | Temp 98.7°F | Wt 229.0 lb

## 2018-09-04 DIAGNOSIS — O219 Vomiting of pregnancy, unspecified: Secondary | ICD-10-CM | POA: Diagnosis not present

## 2018-09-04 DIAGNOSIS — Z3480 Encounter for supervision of other normal pregnancy, unspecified trimester: Secondary | ICD-10-CM | POA: Diagnosis not present

## 2018-09-04 DIAGNOSIS — O09292 Supervision of pregnancy with other poor reproductive or obstetric history, second trimester: Secondary | ICD-10-CM | POA: Diagnosis not present

## 2018-09-04 DIAGNOSIS — B373 Candidiasis of vulva and vagina: Secondary | ICD-10-CM

## 2018-09-04 DIAGNOSIS — O09299 Supervision of pregnancy with other poor reproductive or obstetric history, unspecified trimester: Secondary | ICD-10-CM | POA: Insufficient documentation

## 2018-09-04 DIAGNOSIS — O0990 Supervision of high risk pregnancy, unspecified, unspecified trimester: Secondary | ICD-10-CM | POA: Insufficient documentation

## 2018-09-04 DIAGNOSIS — O0932 Supervision of pregnancy with insufficient antenatal care, second trimester: Secondary | ICD-10-CM

## 2018-09-04 DIAGNOSIS — B3731 Acute candidiasis of vulva and vagina: Secondary | ICD-10-CM

## 2018-09-04 DIAGNOSIS — Z3A20 20 weeks gestation of pregnancy: Secondary | ICD-10-CM

## 2018-09-04 MED ORDER — PROMETHAZINE HCL 12.5 MG PO TABS: 12.5000 mg | ORAL_TABLET | Freq: Every evening | ORAL | 0 refills | Status: DC | PRN

## 2018-09-04 MED ORDER — TERCONAZOLE 0.4 % VA CREA: 1.0000 | TOPICAL_CREAM | Freq: Every day | VAGINAL | 2 refills | Status: DC

## 2018-09-04 MED ORDER — METOCLOPRAMIDE HCL 10 MG PO TABS: 10.0000 mg | ORAL_TABLET | Freq: Three times a day (TID) | ORAL | 0 refills | Status: DC

## 2018-09-04 NOTE — Progress Notes (Signed)
NOB late to care. She is not sure when her had last period, but she had sex 04/25/2018.  C/o NV and abdominal pain.

## 2018-09-04 NOTE — Progress Notes (Signed)
Subjective:   Erin Wilkinson is a 23 y.o. G3P2002 at [redacted]w[redacted]d by date of conception being seen today for her first obstetrical visit.  Her obstetrical history is significant for NSVD x 2 at term, first son with CHD. and has Obesity (BMI 30-39.9); Asthma; Sickle cell trait (HCC); Rubella non-immune status, antepartum; History of CVST associated with congenital heart disease; Anemia of mother in pregnancy; and Supervision of other normal pregnancy, antepartum on their problem list.. Patient does intend to breast feed. Pregnancy history fully reviewed.  Patient reports nausea.  HISTORY: OB History  Gravida Para Term Preterm AB Living  3 2 2  0 0 2  SAB TAB Ectopic Multiple Live Births  0 0 0 0 2    # Outcome Date GA Lbr Len/2nd Weight Sex Delivery Anes PTL Lv  3 Current           2 Term 08/15/17 [redacted]w[redacted]d / 00:42 7 lb 8.3 oz (3.41 kg) M Vag-Spont EPI  LIV     Name: Proia,BOY Mallary     Apgar1: 8  Apgar5: 9  1 Term 02/20/11 [redacted]w[redacted]d 09:45 / 00:28 7 lb 3.7 oz (3.28 kg) M Vag-Spont EPI  LIV     Name: Steely,BOY Aedyn     Apgar1: 9  Apgar5: 9   Past Medical History:  Diagnosis Date  . Asthma   . Dyspnea   . Gestational diabetes   . Gestational diabetes mellitus in pregnancy    new diagnosis 01/2011  . Headache(784.0)   . History of chronic bronchitis   . History of depression   . Syncope    Past Surgical History:  Procedure Laterality Date  . NO PAST SURGERIES     Family History  Problem Relation Age of Onset  . Asthma Mother   . Depression Mother   . Miscarriages / [place] Mother   . Cancer Mother   . Cancer Maternal Uncle   . Diabetes Maternal Grandmother   . Hypertension Maternal Grandmother   . Stroke Paternal Grandmother   . Miscarriages / Stillbirths Sister   . Heart disease Paternal Aunt   . Heart disease Paternal Uncle    Social History   Tobacco Use  . Smoking status: Never Smoker  . Smokeless tobacco: Never Used  Substance Use Topics  . Alcohol use:  No    Alcohol/week: 0.0 standard drinks  . Drug use: No   No Known Allergies Current Outpatient Medications on File Prior to Visit  Medication Sig Dispense Refill  . Prenat-FeAsp-Meth-FA-DHA w/o A (PRENATE PIXIE) 10-0.6-0.4-200 MG CAPS Take 1 tablet by mouth daily. (Patient not taking: Reported on 09/04/2018) 30 capsule 12   No current facility-administered medications on file prior to visit.      Exam   Vitals:   09/04/18 1542  BP: 108/64  Pulse: 87  Temp: 98.7 F (37.1 C)  Weight: 229 lb (103.9 kg)   Fetal Heart Rate (bpm): 156  Uterus:  Fundal Height: 20 cm  Pelvic Exam: Perineum: no hemorrhoids, normal perineum   Vulva: normal external genitalia, no lesions   Vagina:  normal mucosa, normal discharge   Cervix: no lesions and normal, pap smear done.    Adnexa: normal adnexa and no mass, fullness, tenderness   Bony Pelvis: average  System: General: well-developed, well-nourished female in no acute distress   Breast:  normal appearance, no masses or tenderness   Skin: normal coloration and turgor, no rashes   Neurologic: oriented, normal, negative, normal mood  Extremities: normal strength, tone, and muscle mass, ROM of all joints is normal   HEENT PERRLA, extraocular movement intact and sclera clear, anicteric   Mouth/Teeth mucous membranes moist, pharynx normal without lesions and dental hygiene good   Neck supple and no masses   Cardiovascular: regular rate and rhythm   Respiratory:  no respiratory distress, normal breath sounds   Abdomen: soft, non-tender; bowel sounds normal; no masses,  no organomegaly     Assessment:   Pregnancy: [id number] Patient Active Problem List   Diagnosis Date Noted  . Supervision of other normal pregnancy, antepartum 09/04/2018  . Anemia of mother in pregnancy 06/01/2017  . 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 Sickle cell trait (HCC) 02/26/2017  . Rubella non-immune status, antepartum 02/26/2017  .  Asthma 02/19/2017  . Obesity (BMI 30-39.9) 08/04/2013     Plan:  1. Supervision of other normal pregnancy, antepartum --Anticipatory guidance about next visits/weeks of pregnancy given. --Reviewed safety, visitor policy, reassurance about COVID-19 for pregnancy at this time. Discussed possible changes to visits, including televisits, that may occur due to COVID-19.  The office remains open if pt needs to be seen and MAU is open 24 hours/day for OB emergencies.   - Enroll Patient in Babyscripts - Babyscripts Schedule Optimization - Cytology - PAP( [place]) - Obstetric Panel, Including HIV - Culture, OB Urine - Genetic Screening - US MFM OB DETAIL +14 WK; Future  2. Late prenatal care affecting pregnancy, antepartum, second trimester --Pt sure of conception date as 04/25/18.  Fundal height c/w 20 weeks.  Outpatient anatomy US ordered.  3. [redacted] weeks gestation of pregnancy   4. Nausea and vomiting during pregnancy --Renewed Rx for Reglan and Phenergan as prescribed in MAU 5/21. Pt to pick up these medications.   5. Current singleton pregnancy with history of congenital heart disease in prior child, antepartum --Detailed anatomy US with MFM  6. Vaginal candidiasis --Frequent during pregnancy.  Discussed prevention including probiotics, breathable cotton underwear, decreased use of soaps. - terconazole (TERAZOL 7) 0.4 % vaginal cream; Place 1 applicator vaginally at bedtime.  Dispense: 45 g; Refill: 2   Initial labs drawn. Continue prenatal vitamins. Discussed and offered genetic screening options, including Quad screen/AFP, NIPS testing, and option to decline testing. Benefits/risks/alternatives reviewed. Pt aware that anatomy US is form of genetic screening with lower accuracy in detecting trisomies than blood work.  Pt chooses genetic screening today. NIPS: ordered. Ultrasound discussed; fetal anatomic survey: ordered. Problem list reviewed and updated. The nature of [place]  [facility] with multiple MDs and other Advanced Practice Providers was explained to patient; also emphasized that residents, students are part of our team. Routine obstetric precautions reviewed. Return in about 7 weeks (around 10/23/2018).   Sharen CounterLisa Leftwich-Kirby, CNM 09/04/18 4:46 PM

## 2018-09-04 NOTE — Patient Instructions (Signed)
  Some natural remedies/prevention to try for bacterial vaginosis and yeast infections: --Take a probiotic tablet/capsule every day for at least 1-2 months.   --Whenever you have symptoms, use boric acid suppositories vaginally every night for a week.   --Do not use scented soaps/perfumes in the vaginal area and wear breathable cotton underwear and do not wear tight restrictive clothing. --Use condoms during intercourse

## 2018-09-05 ENCOUNTER — Other Ambulatory Visit: Payer: Self-pay | Admitting: Advanced Practice Midwife

## 2018-09-05 LAB — OBSTETRIC PANEL, INCLUDING HIV
Antibody Screen: NEGATIVE
Basophils Absolute: 0.1 10*3/uL (ref 0.0–0.2)
Basos: 0 %
EOS (ABSOLUTE): 0.5 10*3/uL — ABNORMAL HIGH (ref 0.0–0.4)
Eos: 4 %
HIV Screen 4th Generation wRfx: NONREACTIVE
Hematocrit: 33.2 % — ABNORMAL LOW (ref 34.0–46.6)
Hemoglobin: 11.1 g/dL (ref 11.1–15.9)
Hepatitis B Surface Ag: NEGATIVE
Immature Grans (Abs): 0.1 10*3/uL (ref 0.0–0.1)
Immature Granulocytes: 1 %
Lymphocytes Absolute: 2.5 10*3/uL (ref 0.7–3.1)
Lymphs: 21 %
MCH: 24.8 pg — ABNORMAL LOW (ref 26.6–33.0)
MCHC: 33.4 g/dL (ref 31.5–35.7)
MCV: 74 fL — ABNORMAL LOW (ref 79–97)
Monocytes Absolute: 0.8 10*3/uL (ref 0.1–0.9)
Monocytes: 7 %
Neutrophils Absolute: 8 10*3/uL — ABNORMAL HIGH (ref 1.4–7.0)
Neutrophils: 67 %
Platelets: 277 10*3/uL (ref 150–450)
RBC: 4.47 x10E6/uL (ref 3.77–5.28)
RDW: 14.5 % (ref 11.7–15.4)
RPR Ser Ql: NONREACTIVE
Rh Factor: POSITIVE
Rubella Antibodies, IGG: <0.9 index — ABNORMAL LOW (ref >0.99)
WBC: 11.9 10*3/uL — ABNORMAL HIGH (ref 3.4–10.8)

## 2018-09-05 MED ORDER — BLOOD PRESSURE MONITOR KIT: 1.0000 | PACK | Freq: Every day | 0 refills | Status: DC

## 2018-09-06 LAB — CYTOLOGY - PAP
Diagnosis: NEGATIVE
HPV: NOT DETECTED

## 2018-09-06 LAB — URINE CULTURE, OB REFLEX

## 2018-09-06 LAB — CULTURE, OB URINE

## 2018-09-10 ENCOUNTER — Encounter: Payer: Self-pay | Admitting: Advanced Practice Midwife

## 2018-09-11 ENCOUNTER — Ambulatory Visit (HOSPITAL_COMMUNITY): Payer: Medicaid Other | Admitting: *Deleted

## 2018-09-11 ENCOUNTER — Other Ambulatory Visit (HOSPITAL_COMMUNITY): Payer: Self-pay | Admitting: *Deleted

## 2018-09-11 ENCOUNTER — Encounter (HOSPITAL_COMMUNITY): Payer: Self-pay

## 2018-09-11 ENCOUNTER — Ambulatory Visit (HOSPITAL_COMMUNITY)
Admission: RE | Admit: 2018-09-11 | Discharge: 2018-09-11 | Disposition: A | Discharge status: Home / Self Care | Payer: Medicaid Other | Source: Ambulatory Visit | Attending: Obstetrics and Gynecology | Admitting: Obstetrics and Gynecology

## 2018-09-11 ENCOUNTER — Other Ambulatory Visit: Payer: Self-pay

## 2018-09-11 VITALS — BP 109/68 | HR 89 | Temp 98.9°F

## 2018-09-11 DIAGNOSIS — Z363 Encounter for antenatal screening for malformations: Secondary | ICD-10-CM

## 2018-09-11 DIAGNOSIS — O99210 Obesity complicating pregnancy, unspecified trimester: Secondary | ICD-10-CM

## 2018-09-11 DIAGNOSIS — Z862 Personal history of diseases of the blood and blood-forming organs and certain disorders involving the immune mechanism: Secondary | ICD-10-CM

## 2018-09-11 DIAGNOSIS — Z3A20 20 weeks gestation of pregnancy: Secondary | ICD-10-CM

## 2018-09-11 DIAGNOSIS — O0990 Supervision of high risk pregnancy, unspecified, unspecified trimester: Secondary | ICD-10-CM

## 2018-09-11 DIAGNOSIS — Z3480 Encounter for supervision of other normal pregnancy, unspecified trimester: Secondary | ICD-10-CM

## 2018-09-11 DIAGNOSIS — O99212 Obesity complicating pregnancy, second trimester: Secondary | ICD-10-CM

## 2018-09-11 DIAGNOSIS — O09292 Supervision of pregnancy with other poor reproductive or obstetric history, second trimester: Secondary | ICD-10-CM | POA: Diagnosis not present

## 2018-09-17 ENCOUNTER — Encounter: Payer: Self-pay | Admitting: Advanced Practice Midwife

## 2018-09-18 ENCOUNTER — Encounter: Payer: Self-pay | Admitting: Obstetrics and Gynecology

## 2018-10-17 ENCOUNTER — Ambulatory Visit (HOSPITAL_COMMUNITY): Payer: Medicaid Other

## 2018-10-17 ENCOUNTER — Ambulatory Visit (HOSPITAL_COMMUNITY): Payer: Medicaid Other | Attending: Obstetrics and Gynecology

## 2018-10-17 ENCOUNTER — Encounter (HOSPITAL_COMMUNITY): Payer: Self-pay

## 2018-10-23 ENCOUNTER — Other Ambulatory Visit: Payer: Self-pay

## 2018-10-23 ENCOUNTER — Other Ambulatory Visit: Payer: Medicaid Other

## 2018-10-23 ENCOUNTER — Other Ambulatory Visit (HOSPITAL_COMMUNITY)
Admission: RE | Admit: 2018-10-23 | Discharge: 2018-10-23 | Disposition: A | Discharge status: Home / Self Care | Payer: Medicaid Other | Source: Ambulatory Visit | Attending: Obstetrics & Gynecology | Admitting: Obstetrics & Gynecology

## 2018-10-23 ENCOUNTER — Ambulatory Visit (INDEPENDENT_AMBULATORY_CARE_PROVIDER_SITE_OTHER): Payer: Medicaid Other | Admitting: Obstetrics & Gynecology

## 2018-10-23 VITALS — BP 100/67 | HR 86 | Temp 97.3°F | Wt 228.0 lb

## 2018-10-23 DIAGNOSIS — O26892 Other specified pregnancy related conditions, second trimester: Secondary | ICD-10-CM

## 2018-10-23 DIAGNOSIS — B373 Candidiasis of vulva and vagina: Secondary | ICD-10-CM | POA: Diagnosis not present

## 2018-10-23 DIAGNOSIS — O26899 Other specified pregnancy related conditions, unspecified trimester: Secondary | ICD-10-CM

## 2018-10-23 DIAGNOSIS — Z3480 Encounter for supervision of other normal pregnancy, unspecified trimester: Secondary | ICD-10-CM

## 2018-10-23 DIAGNOSIS — O99210 Obesity complicating pregnancy, unspecified trimester: Secondary | ICD-10-CM

## 2018-10-23 DIAGNOSIS — N898 Other specified noninflammatory disorders of vagina: Secondary | ICD-10-CM | POA: Insufficient documentation

## 2018-10-23 DIAGNOSIS — R300 Dysuria: Secondary | ICD-10-CM

## 2018-10-23 DIAGNOSIS — O99212 Obesity complicating pregnancy, second trimester: Secondary | ICD-10-CM | POA: Diagnosis not present

## 2018-10-23 DIAGNOSIS — Z3A27 27 weeks gestation of pregnancy: Secondary | ICD-10-CM

## 2018-10-23 DIAGNOSIS — B3731 Acute candidiasis of vulva and vagina: Secondary | ICD-10-CM

## 2018-10-23 MED ORDER — TERCONAZOLE 0.4 % VA CREA: 1.0000 | TOPICAL_CREAM | Freq: Every day | VAGINAL | 2 refills | Status: DC

## 2018-10-23 NOTE — Patient Instructions (Signed)

## 2018-10-23 NOTE — Progress Notes (Signed)
   PRENATAL VISIT NOTE  Subjective:  Erin Wilkinson is a 23 y.o. G3P2002 at [redacted]w[redacted]d being seen today for ongoing prenatal care.  She is currently monitored for the following issues for this high-risk pregnancy and has Obesity (BMI 30-39.9); Asthma; Sickle cell trait ([place]); Not immune to rubella; Supervision of other normal pregnancy, antepartum; and Current singleton pregnancy with history of congenital heart disease in prior child, antepartum on their problem list.  Patient reports vaginal irritation.  Contractions: Not present. Vag. Bleeding: None.  Movement: Present. Denies leaking of fluid.   The following portions of the patient's history were reviewed and updated as appropriate: allergies, current medications, past family history, past medical history, past social history, past surgical history and problem list.   Objective:   Vitals:   10/23/18 0831  BP: 100/67  Pulse: 86  Temp: (!) 97.3 F (36.3 C)  Weight: 228 lb (103.4 kg)    Fetal Status: Fetal Heart Rate (bpm): 148   Movement: Present     General:  Alert, oriented and cooperative. Patient is in no acute distress.  Skin: Skin is warm and dry. No rash noted.   Cardiovascular: Normal heart rate noted  Respiratory: Normal respiratory effort, no problems with respiration noted  Abdomen: Soft, gravid, appropriate for gestational age.  Pain/Pressure: Present     Pelvic: Cervical exam deferred        Extremities: Normal range of motion.  Edema: None  Mental Status: Normal mood and affect. Normal behavior. Normal judgment and thought content.   Assessment and Plan:  Pregnancy: G3P2002 at [redacted]w[redacted]d 1. Supervision of other normal pregnancy, antepartum Doing well, needs 2 hr GTT asap  2. Vaginal discharge Suspect vaginal yeast, Terazole ordered - Cervicovaginal ancillary only( [place])  3. Dysuria during pregnancy, antepartum  - Urine Culture  Preterm labor symptoms and general obstetric precautions including but not  limited to vaginal bleeding, contractions, leaking of fluid and fetal movement were reviewed in detail with the patient. Please refer to After Visit Summary for other counseling recommendations.   Return asap for 2 hr, may do virtual with provider 4 weeks.  No future appointments.  Emeterio Reeve, MD

## 2018-10-23 NOTE — Progress Notes (Signed)
Pt will not be able to do 2hr due to having an ice pop.  CC: vaginal discharge and odor pt wants swab.

## 2018-10-24 LAB — CERVICOVAGINAL ANCILLARY ONLY
Bacterial vaginitis: NEGATIVE
Candida vaginitis: POSITIVE — AB
Chlamydia: NEGATIVE
Neisseria Gonorrhea: NEGATIVE
Trichomonas: NEGATIVE

## 2018-10-25 LAB — URINE CULTURE

## 2018-11-08 ENCOUNTER — Other Ambulatory Visit: Payer: Medicaid Other

## 2018-11-20 ENCOUNTER — Other Ambulatory Visit: Payer: Self-pay

## 2018-11-20 ENCOUNTER — Encounter: Payer: Self-pay | Admitting: Obstetrics and Gynecology

## 2018-11-20 ENCOUNTER — Telehealth (INDEPENDENT_AMBULATORY_CARE_PROVIDER_SITE_OTHER): Payer: Medicaid Other | Admitting: Obstetrics and Gynecology

## 2018-11-20 DIAGNOSIS — Z3A31 31 weeks gestation of pregnancy: Secondary | ICD-10-CM | POA: Diagnosis not present

## 2018-11-20 DIAGNOSIS — O0993 Supervision of high risk pregnancy, unspecified, third trimester: Secondary | ICD-10-CM | POA: Diagnosis not present

## 2018-11-20 DIAGNOSIS — O09299 Supervision of pregnancy with other poor reproductive or obstetric history, unspecified trimester: Secondary | ICD-10-CM

## 2018-11-20 DIAGNOSIS — O0990 Supervision of high risk pregnancy, unspecified, unspecified trimester: Secondary | ICD-10-CM

## 2018-11-20 DIAGNOSIS — O09293 Supervision of pregnancy with other poor reproductive or obstetric history, third trimester: Secondary | ICD-10-CM

## 2018-11-20 NOTE — Progress Notes (Signed)
   TELEHEALTH OBSTETRICS PRENATAL VIRTUAL VIDEO VISIT ENCOUNTER NOTE  Provider location: [facility] [place] [facility] [place]   I connected with Erin Wilkinson on 11/20/18 at  4:00 PM EDT by MyChart Video Encounter at home and verified that I am speaking with the correct person using two identifiers.   I discussed the limitations, risks, security and privacy concerns of performing an evaluation and management service virtually and the availability of in person appointments. I also discussed with the patient that there may be a patient responsible charge related to this service. The patient expressed understanding and agreed to proceed. Subjective:  Erin Wilkinson is a 23 y.o. G3P2002 at [redacted]w[redacted]d being seen today for ongoing prenatal care.  She is currently monitored for the following issues for this high-risk pregnancy and has Obesity affecting pregnancy, antepartum; Asthma; Sickle cell trait ([place]); Not immune to rubella; Supervision of high risk pregnancy, antepartum; and Current singleton pregnancy with history of congenital heart disease in prior child, antepartum on their problem list.  Patient reports general discomforts of pregnancy.  Contractions: Irritability. Vag. Bleeding: None.  Movement: Present. Denies any leaking of fluid.   The following portions of the patient's history were reviewed and updated as appropriate: allergies, current medications, past family history, past medical history, past social history, past surgical history and problem list.   Objective:  There were no vitals filed for this visit.  Fetal Status:     Movement: Present     General:  Alert, oriented and cooperative. Patient is in no acute distress.  Respiratory: Normal respiratory effort, no problems with respiration noted  Mental Status: Normal mood and affect. Normal behavior. Normal judgment and thought content.  Rest of physical exam deferred due to type of encounter  Imaging: No results found.   Assessment and Plan:  Pregnancy: G3P2002 at [redacted]w[redacted]d 1. Supervision of high risk pregnancy, antepartum Stable Importance of Glucola reviewed with pt. Glucola this week or next - [place] MFM OB FOLLOW UP; Future  2. Current singleton pregnancy with history of congenital heart disease in prior child, antepartum  - US Fetal Echocardiography; Future  Preterm labor symptoms and general obstetric precautions including but not limited to vaginal bleeding, contractions, leaking of fluid and fetal movement were reviewed in detail with the patient. I discussed the assessment and treatment plan with the patient. The patient was provided an opportunity to ask questions and all were answered. The patient agreed with the plan and demonstrated an understanding of the instructions. The patient was advised to call back or seek an in-person office evaluation/go to MAU at [facility] for any urgent or concerning symptoms. Please refer to After Visit Summary for other counseling recommendations.   I provided 8 minutes of face-to-face time during this encounter.  Return for appt this week or next week for Glucola only, 2 week OB appt face to face.  No future appointments.  Chancy Milroy, MD [facility] for [place], [place]

## 2018-11-20 NOTE — Progress Notes (Signed)
Pt is not able to check BP today.  

## 2018-11-27 ENCOUNTER — Ambulatory Visit (HOSPITAL_COMMUNITY)
Admission: RE | Admit: 2018-11-27 | Discharge: 2018-11-27 | Disposition: A | Discharge status: Home / Self Care | Payer: Medicaid Other | Source: Ambulatory Visit | Attending: Maternal & Fetal Medicine | Admitting: Maternal & Fetal Medicine

## 2018-11-27 ENCOUNTER — Encounter (HOSPITAL_COMMUNITY): Payer: Self-pay

## 2018-11-27 ENCOUNTER — Ambulatory Visit (HOSPITAL_COMMUNITY): Payer: Medicaid Other | Admitting: *Deleted

## 2018-11-27 ENCOUNTER — Other Ambulatory Visit: Payer: Self-pay

## 2018-11-27 DIAGNOSIS — O99213 Obesity complicating pregnancy, third trimester: Secondary | ICD-10-CM | POA: Diagnosis not present

## 2018-11-27 DIAGNOSIS — O0990 Supervision of high risk pregnancy, unspecified, unspecified trimester: Secondary | ICD-10-CM | POA: Insufficient documentation

## 2018-11-27 DIAGNOSIS — Z862 Personal history of diseases of the blood and blood-forming organs and certain disorders involving the immune mechanism: Secondary | ICD-10-CM | POA: Diagnosis not present

## 2018-11-27 DIAGNOSIS — Z362 Encounter for other antenatal screening follow-up: Secondary | ICD-10-CM

## 2018-11-27 DIAGNOSIS — O09299 Supervision of pregnancy with other poor reproductive or obstetric history, unspecified trimester: Secondary | ICD-10-CM | POA: Insufficient documentation

## 2018-11-27 DIAGNOSIS — O09293 Supervision of pregnancy with other poor reproductive or obstetric history, third trimester: Secondary | ICD-10-CM

## 2018-11-27 DIAGNOSIS — Z3A32 32 weeks gestation of pregnancy: Secondary | ICD-10-CM

## 2018-11-27 DIAGNOSIS — O99210 Obesity complicating pregnancy, unspecified trimester: Secondary | ICD-10-CM | POA: Diagnosis not present

## 2018-11-28 ENCOUNTER — Other Ambulatory Visit (HOSPITAL_COMMUNITY): Payer: Self-pay | Admitting: *Deleted

## 2018-11-28 DIAGNOSIS — O09293 Supervision of pregnancy with other poor reproductive or obstetric history, third trimester: Secondary | ICD-10-CM

## 2018-12-04 ENCOUNTER — Other Ambulatory Visit: Payer: Self-pay

## 2018-12-04 ENCOUNTER — Encounter: Payer: Self-pay | Admitting: Obstetrics & Gynecology

## 2018-12-04 ENCOUNTER — Telehealth (INDEPENDENT_AMBULATORY_CARE_PROVIDER_SITE_OTHER): Payer: Medicaid Other | Admitting: Obstetrics & Gynecology

## 2018-12-04 DIAGNOSIS — O0993 Supervision of high risk pregnancy, unspecified, third trimester: Secondary | ICD-10-CM | POA: Diagnosis not present

## 2018-12-04 DIAGNOSIS — O99210 Obesity complicating pregnancy, unspecified trimester: Secondary | ICD-10-CM

## 2018-12-04 DIAGNOSIS — O99213 Obesity complicating pregnancy, third trimester: Secondary | ICD-10-CM | POA: Diagnosis not present

## 2018-12-04 DIAGNOSIS — Z3A33 33 weeks gestation of pregnancy: Secondary | ICD-10-CM | POA: Diagnosis not present

## 2018-12-04 DIAGNOSIS — O0990 Supervision of high risk pregnancy, unspecified, unspecified trimester: Secondary | ICD-10-CM

## 2018-12-04 DIAGNOSIS — O09299 Supervision of pregnancy with other poor reproductive or obstetric history, unspecified trimester: Secondary | ICD-10-CM

## 2018-12-04 DIAGNOSIS — O09293 Supervision of pregnancy with other poor reproductive or obstetric history, third trimester: Secondary | ICD-10-CM

## 2018-12-04 NOTE — Patient Instructions (Signed)

## 2018-12-04 NOTE — Progress Notes (Signed)
Pt  changed appt to MyChart from in office.   Pt states she has not been checking B/P at home pt has cuff at home   Pt Declines 2hr GTT.

## 2018-12-04 NOTE — Progress Notes (Signed)
   TELEHEALTH VIRTUAL OBSTETRICS VISIT ENCOUNTER NOTE  I connected with Erin Wilkinson on 12/04/18 at  1:30 PM EDT by telephone at home and verified that I am speaking with the correct person using two identifiers.   I discussed the limitations, risks, security and privacy concerns of performing an evaluation and management service by telephone and the availability of in person appointments. I also discussed with the patient that there may be a patient responsible charge related to this service. The patient expressed understanding and agreed to proceed.  Subjective:  Erin Wilkinson is a 23 y.o. G3P2002 at [redacted]w[redacted]d being followed for ongoing prenatal care.  She is currently monitored for the following issues for this high-risk pregnancy and has Obesity affecting pregnancy, antepartum; Asthma; Sickle cell trait ([place]); Not immune to rubella; Supervision of high risk pregnancy, antepartum; and Current singleton pregnancy with history of congenital heart disease in prior child, antepartum on their problem list.  Patient reports no complaints. Reports fetal movement. Denies any contractions, bleeding or leaking of fluid. She has not been taking BP.  The following portions of the patient's history were reviewed and updated as appropriate: allergies, current medications, past family history, past medical history, past social history, past surgical history and problem list.   Objective:   General:  Alert, oriented and cooperative.   Mental Status: Normal mood and affect perceived. Normal judgment and thought content.  Rest of physical exam deferred due to type of encounter  Assessment and Plan:  Pregnancy: G3P2002 at [redacted]w[redacted]d 1. Supervision of high risk pregnancy, antepartum - Fetal Echo ordered at last visit; scheduling working to get this scheduled for her today - Growth scan f/u next month - patient still needs Tdap when able to come in-person - RTC in 2 weeks for GBS  2. Obesity affecting  pregnancy, antepartum - appropriate weight gain previously discussed  3. Current singleton pregnancy with history of congenital heart disease in prior child, antepartum - fetal echo in process of being scheduled as discussed above   Preterm labor symptoms and general obstetric precautions including but not limited to vaginal bleeding, contractions, leaking of fluid and fetal movement were reviewed in detail with the patient.  I discussed the assessment and treatment plan with the patient. The patient was provided an opportunity to ask questions and all were answered. The patient agreed with the plan and demonstrated an understanding of the instructions. The patient was advised to call back or seek an in-person office evaluation/go to [facility] at [facility] for any urgent or concerning symptoms. Please refer to After Visit Summary for other counseling recommendations.   I provided 15 minutes of non-face-to-face time during this encounter.  Return in about 2 weeks (around 12/18/2018) for for GBS and Tdap.  Future Appointments  Date Time Provider [place]  12/25/2018  3:30 PM [place] [facility]  12/25/2018  3:30 PM [place] [facility]    Chauncey Mann, MD [facility] [employer], [place]

## 2018-12-25 ENCOUNTER — Other Ambulatory Visit: Payer: Self-pay

## 2018-12-25 ENCOUNTER — Encounter (HOSPITAL_COMMUNITY): Payer: Self-pay

## 2018-12-25 ENCOUNTER — Ambulatory Visit (HOSPITAL_COMMUNITY)
Admission: RE | Admit: 2018-12-25 | Discharge: 2018-12-25 | Disposition: A | Discharge status: Home / Self Care | Payer: Medicaid Other | Source: Ambulatory Visit | Attending: Obstetrics and Gynecology | Admitting: Obstetrics and Gynecology

## 2018-12-25 ENCOUNTER — Ambulatory Visit (HOSPITAL_COMMUNITY): Payer: Medicaid Other | Admitting: *Deleted

## 2018-12-25 DIAGNOSIS — O0990 Supervision of high risk pregnancy, unspecified, unspecified trimester: Secondary | ICD-10-CM | POA: Diagnosis present

## 2018-12-25 DIAGNOSIS — Z362 Encounter for other antenatal screening follow-up: Secondary | ICD-10-CM

## 2018-12-25 DIAGNOSIS — O09293 Supervision of pregnancy with other poor reproductive or obstetric history, third trimester: Secondary | ICD-10-CM | POA: Diagnosis not present

## 2018-12-25 DIAGNOSIS — O99213 Obesity complicating pregnancy, third trimester: Secondary | ICD-10-CM

## 2018-12-25 DIAGNOSIS — O09299 Supervision of pregnancy with other poor reproductive or obstetric history, unspecified trimester: Secondary | ICD-10-CM | POA: Insufficient documentation

## 2018-12-25 DIAGNOSIS — Z8632 Personal history of gestational diabetes: Secondary | ICD-10-CM | POA: Insufficient documentation

## 2018-12-25 DIAGNOSIS — Z862 Personal history of diseases of the blood and blood-forming organs and certain disorders involving the immune mechanism: Secondary | ICD-10-CM | POA: Diagnosis not present

## 2018-12-25 DIAGNOSIS — Z3A36 36 weeks gestation of pregnancy: Secondary | ICD-10-CM

## 2019-01-10 ENCOUNTER — Encounter (HOSPITAL_COMMUNITY): Payer: Self-pay

## 2019-01-10 ENCOUNTER — Inpatient Hospital Stay (HOSPITAL_COMMUNITY)
Admission: AD | Admit: 2019-01-10 | Discharge: 2019-01-11 | Disposition: A | Discharge status: Home / Self Care | Payer: Medicaid Other | Source: Home / Self Care | Attending: Obstetrics & Gynecology | Admitting: Obstetrics & Gynecology

## 2019-01-10 ENCOUNTER — Other Ambulatory Visit: Payer: Self-pay

## 2019-01-10 DIAGNOSIS — Z3A39 39 weeks gestation of pregnancy: Secondary | ICD-10-CM | POA: Insufficient documentation

## 2019-01-10 DIAGNOSIS — O09299 Supervision of pregnancy with other poor reproductive or obstetric history, unspecified trimester: Secondary | ICD-10-CM

## 2019-01-10 DIAGNOSIS — O09893 Supervision of other high risk pregnancies, third trimester: Secondary | ICD-10-CM | POA: Insufficient documentation

## 2019-01-10 DIAGNOSIS — O0990 Supervision of high risk pregnancy, unspecified, unspecified trimester: Secondary | ICD-10-CM

## 2019-01-10 NOTE — MAU Note (Signed)
Pt reports she lost her mucus plug a few days ago. Ctx started yesterday and are 10 min apart now. Denies any vag bleeding or leaking and reports fetal movement a little less than normal.

## 2019-01-11 ENCOUNTER — Inpatient Hospital Stay (HOSPITAL_COMMUNITY): Payer: Medicaid Other | Admitting: Anesthesiology

## 2019-01-11 ENCOUNTER — Inpatient Hospital Stay (HOSPITAL_COMMUNITY)
Admission: AD | Admit: 2019-01-11 | Discharge: 2019-01-13 | DRG: 807 | Disposition: A | Discharge status: Home / Self Care | Payer: Medicaid Other | Attending: Obstetrics and Gynecology | Admitting: Obstetrics and Gynecology

## 2019-01-11 ENCOUNTER — Encounter (HOSPITAL_COMMUNITY): Payer: Self-pay

## 2019-01-11 DIAGNOSIS — Z3A39 39 weeks gestation of pregnancy: Secondary | ICD-10-CM

## 2019-01-11 DIAGNOSIS — O9902 Anemia complicating childbirth: Principal | ICD-10-CM | POA: Diagnosis present

## 2019-01-11 DIAGNOSIS — Z20828 Contact with and (suspected) exposure to other viral communicable diseases: Secondary | ICD-10-CM | POA: Diagnosis present

## 2019-01-11 DIAGNOSIS — O09299 Supervision of pregnancy with other poor reproductive or obstetric history, unspecified trimester: Secondary | ICD-10-CM

## 2019-01-11 DIAGNOSIS — Z3689 Encounter for other specified antenatal screening: Secondary | ICD-10-CM | POA: Diagnosis not present

## 2019-01-11 DIAGNOSIS — O99214 Obesity complicating childbirth: Secondary | ICD-10-CM | POA: Diagnosis present

## 2019-01-11 DIAGNOSIS — O0990 Supervision of high risk pregnancy, unspecified, unspecified trimester: Secondary | ICD-10-CM

## 2019-01-11 DIAGNOSIS — D573 Sickle-cell trait: Secondary | ICD-10-CM | POA: Diagnosis present

## 2019-01-11 DIAGNOSIS — O26893 Other specified pregnancy related conditions, third trimester: Secondary | ICD-10-CM | POA: Diagnosis present

## 2019-01-11 LAB — CBC
HCT: 36.8 % (ref 36.0–46.0)
Hemoglobin: 12 g/dL (ref 12.0–15.0)
MCH: 24.4 pg — ABNORMAL LOW (ref 26.0–34.0)
MCHC: 32.6 g/dL (ref 30.0–36.0)
MCV: 74.8 fL — ABNORMAL LOW (ref 80.0–100.0)
Platelets: 266 10*3/uL (ref 150–400)
RBC: 4.92 MIL/uL (ref 3.87–5.11)
RDW: 15.9 % — ABNORMAL HIGH (ref 11.5–15.5)
WBC: 11.2 10*3/uL — ABNORMAL HIGH (ref 4.0–10.5)
nRBC: 0 % (ref 0.0–0.2)

## 2019-01-11 LAB — TYPE AND SCREEN
ABO/RH(D): O POS
Antibody Screen: NEGATIVE

## 2019-01-11 LAB — POCT FERN TEST: POCT Fern Test: POSITIVE

## 2019-01-11 LAB — SARS CORONAVIRUS 2 BY RT PCR (HOSPITAL ORDER, PERFORMED IN ~~LOC~~ HOSPITAL LAB): SARS Coronavirus 2: NEGATIVE

## 2019-01-11 LAB — ABO/RH: ABO/RH(D): O POS

## 2019-01-11 MED ORDER — LACTATED RINGERS IV SOLN: 500.0000 mL | INTRAVENOUS | Status: DC | PRN

## 2019-01-11 MED ORDER — ACETAMINOPHEN 325 MG PO TABS
650.0000 mg | ORAL_TABLET | ORAL | Status: DC | PRN
  Administered 2019-01-12: 650 mg via ORAL
  Filled 2019-01-11: qty 2

## 2019-01-11 MED ORDER — LACTATED RINGERS IV SOLN: 500.0000 mL | Freq: Once | INTRAVENOUS | Status: DC

## 2019-01-11 MED ORDER — OXYTOCIN 40 UNITS IN NORMAL SALINE INFUSION - SIMPLE MED
1.0000 m[IU]/min | INTRAVENOUS | Status: DC
  Administered 2019-01-11: 2 m[IU]/min via INTRAVENOUS

## 2019-01-11 MED ORDER — DIBUCAINE (PERIANAL) 1 % EX OINT
1.0000 "application " | TOPICAL_OINTMENT | CUTANEOUS | Status: DC | PRN
  Administered 2019-01-11: 1 via RECTAL
  Filled 2019-01-11: qty 28

## 2019-01-11 MED ORDER — SODIUM CHLORIDE (PF) 0.9 % IJ SOLN
INTRAMUSCULAR | Status: DC | PRN
  Administered 2019-01-11: 12 mL/h via EPIDURAL

## 2019-01-11 MED ORDER — FLEET ENEMA 7-19 GM/118ML RE ENEM: 1.0000 | ENEMA | RECTAL | Status: DC | PRN

## 2019-01-11 MED ORDER — ACETAMINOPHEN 325 MG PO TABS: 650.0000 mg | ORAL_TABLET | ORAL | Status: DC | PRN

## 2019-01-11 MED ORDER — DIPHENHYDRAMINE HCL 25 MG PO CAPS: 25.0000 mg | ORAL_CAPSULE | Freq: Four times a day (QID) | ORAL | Status: DC | PRN

## 2019-01-11 MED ORDER — LACTATED RINGERS IV SOLN
INTRAVENOUS | Status: DC
  Administered 2019-01-11 (×3): via INTRAVENOUS

## 2019-01-11 MED ORDER — LIDOCAINE HCL (PF) 1 % IJ SOLN: 30.0000 mL | INTRAMUSCULAR | Status: DC | PRN

## 2019-01-11 MED ORDER — SIMETHICONE 80 MG PO CHEW: 80.0000 mg | CHEWABLE_TABLET | ORAL | Status: DC | PRN

## 2019-01-11 MED ORDER — SOD CITRATE-CITRIC ACID 500-334 MG/5ML PO SOLN: 30.0000 mL | ORAL | Status: DC | PRN

## 2019-01-11 MED ORDER — DIPHENHYDRAMINE HCL 50 MG/ML IJ SOLN: 12.5000 mg | INTRAMUSCULAR | Status: DC | PRN

## 2019-01-11 MED ORDER — LIDOCAINE HCL (PF) 1 % IJ SOLN
INTRAMUSCULAR | Status: DC | PRN
  Administered 2019-01-11: 10 mL via EPIDURAL
  Administered 2019-01-11: 2 mL via EPIDURAL

## 2019-01-11 MED ORDER — IBUPROFEN 600 MG PO TABS
600.0000 mg | ORAL_TABLET | Freq: Four times a day (QID) | ORAL | Status: DC
  Administered 2019-01-11 – 2019-01-13 (×6): 600 mg via ORAL
  Filled 2019-01-11 (×6): qty 1

## 2019-01-11 MED ORDER — EPHEDRINE 5 MG/ML INJ: 10.0000 mg | INTRAVENOUS | Status: DC | PRN

## 2019-01-11 MED ORDER — TETANUS-DIPHTH-ACELL PERTUSSIS 5-2.5-18.5 LF-MCG/0.5 IM SUSP: 0.5000 mL | Freq: Once | INTRAMUSCULAR | Status: DC

## 2019-01-11 MED ORDER — BENZOCAINE-MENTHOL 20-0.5 % EX AERO
1.0000 "application " | INHALATION_SPRAY | CUTANEOUS | Status: DC | PRN
  Administered 2019-01-11 – 2019-01-13 (×2): 1 via TOPICAL
  Filled 2019-01-11 (×2): qty 56

## 2019-01-11 MED ORDER — ZOLPIDEM TARTRATE 5 MG PO TABS: 5.0000 mg | ORAL_TABLET | Freq: Every evening | ORAL | Status: DC | PRN

## 2019-01-11 MED ORDER — PHENYLEPHRINE 40 MCG/ML (10ML) SYRINGE FOR IV PUSH (FOR BLOOD PRESSURE SUPPORT): 80.0000 ug | PREFILLED_SYRINGE | INTRAVENOUS | Status: DC | PRN

## 2019-01-11 MED ORDER — FENTANYL-BUPIVACAINE-NACL 0.5-0.125-0.9 MG/250ML-% EP SOLN
12.0000 mL/h | EPIDURAL | Status: DC | PRN
  Filled 2019-01-11: qty 250

## 2019-01-11 MED ORDER — SENNOSIDES-DOCUSATE SODIUM 8.6-50 MG PO TABS
2.0000 | ORAL_TABLET | ORAL | Status: DC
  Administered 2019-01-11 – 2019-01-12 (×2): 2 via ORAL
  Filled 2019-01-11 (×2): qty 2

## 2019-01-11 MED ORDER — OXYCODONE-ACETAMINOPHEN 5-325 MG PO TABS: 2.0000 | ORAL_TABLET | ORAL | Status: DC | PRN

## 2019-01-11 MED ORDER — FENTANYL CITRATE (PF) 100 MCG/2ML IJ SOLN: 100.0000 ug | INTRAMUSCULAR | Status: DC | PRN

## 2019-01-11 MED ORDER — OXYCODONE-ACETAMINOPHEN 5-325 MG PO TABS: 1.0000 | ORAL_TABLET | ORAL | Status: DC | PRN

## 2019-01-11 MED ORDER — ONDANSETRON HCL 4 MG PO TABS: 4.0000 mg | ORAL_TABLET | ORAL | Status: DC | PRN

## 2019-01-11 MED ORDER — TERBUTALINE SULFATE 1 MG/ML IJ SOLN: 0.2500 mg | Freq: Once | INTRAMUSCULAR | Status: DC | PRN

## 2019-01-11 MED ORDER — OXYTOCIN 40 UNITS IN NORMAL SALINE INFUSION - SIMPLE MED
2.5000 [IU]/h | INTRAVENOUS | Status: DC
  Administered 2019-01-11: 2.5 [IU]/h via INTRAVENOUS
  Filled 2019-01-11: qty 1000

## 2019-01-11 MED ORDER — ONDANSETRON HCL 4 MG/2ML IJ SOLN: 4.0000 mg | INTRAMUSCULAR | Status: DC | PRN

## 2019-01-11 MED ORDER — PRENATAL MULTIVITAMIN CH
1.0000 | ORAL_TABLET | Freq: Every day | ORAL | Status: DC
  Administered 2019-01-12: 1 via ORAL
  Filled 2019-01-11: qty 1

## 2019-01-11 MED ORDER — ONDANSETRON HCL 4 MG/2ML IJ SOLN: 4.0000 mg | Freq: Four times a day (QID) | INTRAMUSCULAR | Status: DC | PRN

## 2019-01-11 MED ORDER — COCONUT OIL OIL
1.0000 "application " | TOPICAL_OIL | Status: DC | PRN
  Administered 2019-01-13: 1 via TOPICAL

## 2019-01-11 MED ORDER — WITCH HAZEL-GLYCERIN EX PADS
1.0000 "application " | MEDICATED_PAD | CUTANEOUS | Status: DC | PRN
  Administered 2019-01-11: 1 via TOPICAL

## 2019-01-11 MED ORDER — OXYTOCIN BOLUS FROM INFUSION: 500.0000 mL | Freq: Once | INTRAVENOUS | Status: DC

## 2019-01-11 NOTE — MAU Provider Note (Signed)
Pt informed that the ultrasound is considered a limited OB ultrasound and is not intended to be a complete ultrasound exam.  Patient also informed that the ultrasound is not being completed with the intent of assessing for fetal or placental anomalies or any pelvic abnormalities.  Explained that the purpose of today's ultrasound is to assess for  presentation (VTX).  Patient acknowledges the purpose of the exam and the limitations of the study.    Clarisa Fling, NP  12:40 PM 01/11/2019

## 2019-01-11 NOTE — Discharge Instructions (Signed)
Fetal Movement Counts Patient Name: ________________________________________________ Patient Due Date: ____________________ What is a fetal movement count?  A fetal movement count is the number of times that you feel your baby move during a certain amount of time. This may also be called a fetal kick count. A fetal movement count is recommended for every pregnant woman. You may be asked to start counting fetal movements as early as week 28 of your pregnancy. Pay attention to when your baby is most active. You may notice your baby's sleep and wake cycles. You may also notice things that make your baby move more. You should do a fetal movement count:  When your baby is normally most active.  At the same time each day. A good time to count movements is while you are resting, after having something to eat and drink. How do I count fetal movements? 1. Find a quiet, comfortable area. Sit, or lie down on your side. 2. Write down the date, the start time and stop time, and the number of movements that you felt between those two times. Take this information with you to your health care visits. 3. For 2 hours, count kicks, flutters, swishes, rolls, and jabs. You should feel at least 10 movements during 2 hours. 4. You may stop counting after you have felt 10 movements. 5. If you do not feel 10 movements in 2 hours, have something to eat and drink. Then, keep resting and counting for 1 hour. If you feel at least 4 movements during that hour, you may stop counting. Contact a health care provider if:  You feel fewer than 4 movements in 2 hours.  Your baby is not moving like he or she usually does. Date: ____________ Start time: ____________ Stop time: ____________ Movements: ____________ Date: ____________ Start time: ____________ Stop time: ____________ Movements: ____________ Date: ____________ Start time: ____________ Stop time: ____________ Movements: ____________ Date: ____________ Start time:  ____________ Stop time: ____________ Movements: ____________ Date: ____________ Start time: ____________ Stop time: ____________ Movements: ____________ Date: ____________ Start time: ____________ Stop time: ____________ Movements: ____________ Date: ____________ Start time: ____________ Stop time: ____________ Movements: ____________ Date: ____________ Start time: ____________ Stop time: ____________ Movements: ____________ Date: ____________ Start time: ____________ Stop time: ____________ Movements: ____________ This information is not intended to replace advice given to you by your health care provider. Make sure you discuss any questions you have with your health care provider. Document Released: 04/26/2006 Document Revised: 04/16/2018 Document Reviewed: 05/06/2015 Elsevier Patient Education  2020 Else[employer] Braxton Hicks Contractions Contractions of the uterus can occur throughout pregnancy, but they are not always a sign that you are in labor. You may have practice contractions called Braxton Hicks contractions. These false labor contractions are sometimes confused with true labor. What are Braxton Hicks contractions? Braxton Hicks contractions are tightening movements that occur in the muscles of the uterus before labor. Unlike true labor contractions, these contractions do not result in opening (dilation) and thinning of the cervix. Toward the end of pregnancy (32-34 weeks), Braxton Hicks contractions can happen more often and may become stronger. These contractions are sometimes difficult to tell apart from true labor because they can be very uncomfortable. You should not feel embarrassed if you go to the hospital with false labor. Sometimes, the only way to tell if you are in true labor is for your health care provider to look for changes in the cervix. The health care provider will do a physical exam and may monitor your contractions. If you   are not in true labor, the exam should show  that your cervix is not dilating and your water has not broken. If there are no other health problems associated with your pregnancy, it is completely safe for you to be sent home with false labor. You may continue to have Braxton Hicks contractions until you go into true labor. How to tell the difference between true labor and false labor True labor  Contractions last 30-70 seconds.  Contractions become very regular.  Discomfort is usually felt in the top of the uterus, and it spreads to the lower abdomen and low back.  Contractions do not go away with walking.  Contractions usually become more intense and increase in frequency.  The cervix dilates and gets thinner. False labor  Contractions are usually shorter and not as strong as true labor contractions.  Contractions are usually irregular.  Contractions are often felt in the front of the lower abdomen and in the groin.  Contractions may go away when you walk around or change positions while lying down.  Contractions get weaker and are shorter-lasting as time goes on.  The cervix usually does not dilate or become thin. Follow these instructions at home:   Take over-the-counter and prescription medicines only as told by your health care provider.  Keep up with your usual exercises and follow other instructions from your health care provider.  Eat and drink lightly if you think you are going into labor.  If Braxton Hicks contractions are making you uncomfortable: ? Change your position from lying down or resting to walking, or change from walking to resting. ? Sit and rest in a tub of warm water. ? Drink enough fluid to keep your urine pale yellow. Dehydration may cause these contractions. ? Do slow and deep breathing several times an hour.  Keep all follow-up prenatal visits as told by your health care provider. This is important. Contact a health care provider if:  You have a fever.  You have continuous pain in  your abdomen. Get help right away if:  Your contractions become stronger, more regular, and closer together.  You have fluid leaking or gushing from your vagina.  You pass blood-tinged mucus (bloody show).  You have bleeding from your vagina.  You have low back pain that you never had before.  You feel your baby's head pushing down and causing pelvic pressure.  Your baby is not moving inside you as much as it used to. Summary  Contractions that occur before labor are called Braxton Hicks contractions, false labor, or practice contractions.  Braxton Hicks contractions are usually shorter, weaker, farther apart, and less regular than true labor contractions. True labor contractions usually become progressively stronger and regular, and they become more frequent.  Manage discomfort from Braxton Hicks contractions by changing position, resting in a warm bath, drinking plenty of water, or practicing deep breathing. This information is not intended to replace advice given to you by your health care provider. Make sure you discuss any questions you have with your health care provider. Document Released: 08/10/2016 Document Revised: 03/09/2017 Document Reviewed: 08/10/2016 Elsevier Patient Education  2020 Elsevier Inc.  

## 2019-01-11 NOTE — Anesthesia Preprocedure Evaluation (Signed)
Anesthesia Evaluation  Patient identified by MRN, date of birth, ID band Patient awake    Reviewed: Allergy & Precautions, NPO status , Patient's Chart, lab work & pertinent test results  Airway Mallampati: II  TM Distance: >3 FB Neck ROM: Full    Dental no notable dental hx.    Pulmonary shortness of breath, asthma ,    Pulmonary exam normal breath sounds clear to auscultation       Cardiovascular negative cardio ROS Normal cardiovascular exam Rhythm:Regular Rate:Normal     Neuro/Psych  Headaches, PSYCHIATRIC DISORDERS Depression negative neurological ROS     GI/Hepatic negative GI ROS, Neg liver ROS,   Endo/Other  diabetes, GestationalMorbid obesityBMI 45  Renal/GU negative Renal ROS     Musculoskeletal negative musculoskeletal ROS (+)   Abdominal (+) + obese,   Peds  Hematology  (+) anemia ,   Anesthesia Other Findings   Reproductive/Obstetrics (+) Pregnancy                             Anesthesia Physical  Anesthesia Plan  ASA: III  Anesthesia Plan: Epidural   Post-op Pain Management:    Induction:   PONV Risk Score and Plan:   Airway Management Planned:   Additional Equipment: None  Intra-op Plan:   Post-operative Plan:   Informed Consent: I have reviewed the patients History and Physical, chart, labs and discussed the procedure including the risks, benefits and alternatives for the proposed anesthesia with the patient or authorized representative who has indicated his/her understanding and acceptance.       Plan Discussed with:   Anesthesia Plan Comments:         Anesthesia Quick Evaluation

## 2019-01-11 NOTE — Anesthesia Procedure Notes (Signed)
Epidural Patient location during procedure: OB Start time: 01/11/2019 1:26 PM End time: 01/11/2019 1:38 PM  Staffing Anesthesiologist: Pervis Hocking, DO Performed: anesthesiologist   Preanesthetic Checklist Completed: patient identified, pre-op evaluation, timeout performed, IV checked, risks and benefits discussed and monitors and equipment checked  Epidural Patient position: sitting Prep: site prepped and draped and DuraPrep Patient monitoring: continuous pulse ox, blood pressure, heart rate and cardiac monitor Approach: midline Location: L3-L4 Injection technique: LOR air  Needle:  Needle type: Tuohy  Needle gauge: 17 G Needle length: 9 cm Needle insertion depth: 5 cm Catheter type: closed end flexible Catheter size: 19 Gauge Catheter at skin depth: 11 cm Test dose: negative  Assessment Sensory level: T8 Events: blood not aspirated, injection not painful, no injection resistance, negative IV test and no paresthesia  Additional Notes Patient identified. Risks/Benefits/Options discussed with patient including but not limited to bleeding, infection, nerve damage, paralysis, failed block, incomplete pain control, headache, blood pressure changes, nausea, vomiting, reactions to medication both or allergic, itching and postpartum back pain. Confirmed with bedside nurse the patient's most recent platelet count. Confirmed with patient that they are not currently taking any anticoagulation, have any bleeding history or any family history of bleeding disorders. Patient expressed understanding and wished to proceed. All questions were answered. Sterile technique was used throughout the entire procedure. Please see nursing notes for vital signs. Test dose was given through epidural catheter and negative prior to continuing to dose epidural or start infusion. Warning signs of high block given to the patient including shortness of breath, tingling/numbness in hands, complete motor  block, or any concerning symptoms with instructions to call for help. Patient was given instructions on fall risk and not to get out of bed. All questions and concerns addressed with instructions to call with any issues or inadequate analgesia.  Reason for block:procedure for pain

## 2019-01-11 NOTE — H&P (Signed)
Erin Wilkinson is a 23 y.o. female G56P2002 with IUP at 22w2dby conception date presenting for spontaneous onset of labor. She reports +FMs, No LOF, no VB, no blurry vision, headaches or peripheral edema, and RUQ pain.  She plans on breast feeding. She request nothing for birth control. [place] consistent with anatomy scan --->  Estimated Date of Delivery: 01/16/19    Prenatal History/Complications:  Past Medical History: Past Medical History:  Diagnosis Date  . Asthma   . Dyspnea   . Gestational diabetes   . Gestational diabetes mellitus in pregnancy    new diagnosis 01/2011  . Headache(784.0)   . History of chronic bronchitis   . History of depression   . Syncope     Past Surgical History: Past Surgical History:  Procedure Laterality Date  . NO PAST SURGERIES      Obstetrical History: OB History    Gravida  3   Para  2   Term  2   Preterm      AB      Living  2     SAB      TAB      Ectopic      Multiple  0   Live Births  2           Social History Social History   Socioeconomic History  . Marital status: Single    Spouse name: Not on file  . Number of children: Not on file  . Years of education: Not on file  . Highest education level: Not on file  Occupational History  . Not on file  Social Needs  . Financial resource strain: Not on file  . Food insecurity    Worry: Not on file    Inability: Not on file  . Transportation needs    Medical: Not on file    Non-medical: Not on file  Tobacco Use  . Smoking status: Never Smoker  . Smokeless tobacco: Never Used  Substance and Sexual Activity  . Alcohol use: No    Alcohol/week: 0.0 standard drinks  . Drug use: No  . Sexual activity: Yes    Partners: Male    Birth control/protection: None  Lifestyle  . Physical activity    Days per week: Not on file    Minutes per session: Not  on file  . Stress: Not on file  Relationships  . Social [place] phone: Not on file    Gets together: Not on file    Attends religious service: Not on file    Active member of club or organization: Not on file    Attends meetings of clubs or organizations: Not on file    Relationship status: Not on file  Other Topics Concern  . Not on file  Social History Narrative  . Not on file    Family History: Family History  Problem Relation Age of Onset  . Asthma Mother   . Depression Mother   . Miscarriages / [place]   . Cancer Mother   . Cancer Maternal Uncle   . Diabetes Maternal Grandmother   . Hypertension Maternal Grandmother   . Stroke Paternal Grandmother   . Miscarriages / Stillbirths Sister   . Heart disease Paternal Aunt   . Heart disease Paternal Uncle     Allergies: No Known Allergies  Medications Prior  to Admission  Medication Sig Dispense Refill Last Dose  . Blood Pressure Monitor KIT 1 each by Does not apply route daily. (Patient not taking: Reported on 12/04/2018) 180 each 0   . metoCLOPramide (REGLAN) 10 MG tablet Take 1 tablet (10 mg total) by mouth 3 (three) times daily before meals. (Patient not taking: Reported on 09/11/2018) 30 tablet 0   . Prenat-FeAsp-Meth-FA-DHA w/o A (PRENATE PIXIE) 10-0.6-0.4-200 MG CAPS Take 1 tablet by mouth daily. 30 capsule 12   . promethazine (PHENERGAN) 12.5 MG tablet Take 1 tablet (12.5 mg total) by mouth at bedtime as needed for nausea or vomiting. (Patient not taking: Reported on 09/11/2018) 30 tablet 0   . terconazole (TERAZOL 7) 0.4 % vaginal cream Place 1 applicator vaginally at bedtime. (Patient not taking: Reported on 12/04/2018) 45 g 2      Review of Systems   All systems reviewed and negative except as stated in HPI  Blood pressure 118/76, pulse 92, temperature 98 F (36.7 C), temperature source Oral, resp. rate 18, last menstrual period 03/16/2018, SpO2 98 %, currently breastfeeding. General  appearance: alert, cooperative and no distress Lungs: clear to auscultation bilaterally Heart: regular rate and rhythm Abdomen: soft, non-tender; bowel sounds normal Pelvic: n/a Extremities: Homans sign is negative, no sign of DVT DTR's +2 Presentation: cephalic Fetal monitoringBaseline: 135 bpm, Variability: Good {> 6 bpm), Accelerations: Reactive and Decelerations: Absent Uterine activity: Frequency: Every 5-7 minutes Dilation: 5 Effacement (%): 90 Exam by:: Erin Hammed RN   Prenatal labs: ABO, Rh: O/Positive/-- (05/27 1640) Antibody: Negative (05/27 1640) Rubella: <0.90 (05/27 1640) RPR: Non Reactive (05/27 1640)  HBsAg: Negative (05/27 1640)  HIV: Non Reactive (05/27 1640)  GBS:     Prenatal Transfer Tool  Maternal Diabetes: No Genetic Screening: Declined Maternal Ultrasounds/Referrals: Normal Fetal Ultrasounds or other Referrals:  None Maternal Substance Abuse:  No Significant Maternal Medications:  None Significant Maternal Lab Results: None  Results for orders placed or performed during the hospital encounter of 01/11/19 (from the past 24 hour(s))  POCT fern test   Collection Time: 01/11/19 12:13 PM  Result Value Ref Range   POCT Fern Test Positive = ruptured amniotic membanes     Patient Active Problem List   Diagnosis Date Noted  . Supervision of high risk pregnancy, antepartum 09/04/2018  . 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 Sickle cell trait ([place]) 02/26/2017  . Not immune to rubella 02/26/2017  . Asthma 02/19/2017  . Obesity affecting pregnancy, antepartum 08/04/2013    Assessment/Plan:  Erin Wilkinson is a 23 y.o. G3P2002 at 72w2dhere for spontaneous onset of labor  #Labor: AROM with small amount of clear fluid. Will start pitocin 2x2 per patient request #Pain: epidural #FWB: Cat 1 #ID:  GBS unknown, no hx of GBS in previous pregnancies and term, will not treat at this  time, PCR pending #MOF: breast #MOC: none #Circ:  outpatient  CWende Mott CNM  01/11/2019, 12:15 PM

## 2019-01-11 NOTE — Discharge Summary (Addendum)
Postpartum Discharge Summary    Patient Name: Erin Wilkinson DOB: 04-14-1995 MRN: [id number]  Date of admission: 01/11/2019 Delivering Provider: Wende Mott   Date of discharge: 01/13/2019  Admitting diagnosis: CTX 5 min  Intrauterine pregnancy: [redacted]w[redacted]d    Secondary diagnosis:  Active Problems:   Normal labor  Additional problems: limited prenatal care     Discharge diagnosis: Term Pregnancy Delivered                                                                                                Post partum procedures:n/a  Augmentation: AROM and Pitocin  Complications: None  Hospital course:  Onset of Labor With Vaginal Delivery     23y.o. yo [id number] admitted in Active Labor on 01/11/2019. Patient had an uncomplicated labor course as follows:  AROM at 6cm with pitocin, delivered in less than one hour after.  Membrane Rupture Time/Date: 3:21 PM ,01/11/2019   Intrapartum Procedures: Episiotomy: None [1]                                         Lacerations:  None [1]  Patient had a delivery of a Viable infant. 01/11/2019  Information for the patient's newborn:  [place], [place][[id number]]Delivery Method: Vaginal, Spontaneous(Filed from Delivery Summary)     Pateint had an uncomplicated postpartum course.  She is ambulating, tolerating a regular diet, passing flatus, and urinating well. Patient is discharged home in stable condition on 01/13/19.  Delivery time: 4:22 PM    Magnesium Sulfate received: No BMZ received: No Rhophylac:N/A MMR:Yes Transfusion:No  Physical exam  Vitals:   01/12/19 0730 01/12/19 1409 01/12/19 2114 01/13/19 0458  BP: 105/71 (!) 98/54 97/76 (!) 101/53  Pulse: 67 74 87 70  Resp: _0 Temp: 98.1 F (36.7 C) 98.4 F (36.9 C) 98.7 F (37.1 C) 98.3 F (36.8 C)  TempSrc: Oral Oral Oral Oral  SpO2: 99% 100% 100% 100%   General: alert, cooperative and no distress Lochia: appropriate Uterine Fundus: firm Incision:  N/A DVT Evaluation: No evidence of DVT seen on physical exam. Labs: Lab Results  Component Value Date   WBC 12.8 (H) 01/12/2019   HGB 10.7 (L) 01/12/2019   HCT 33.2 (L) 01/12/2019   MCV 75.6 (L) 01/12/2019   PLT 221 01/12/2019   CMP Latest Ref Rng & Units 08/29/2018  Glucose 70 - 99 mg/dL 78  BUN 6 - 20 mg/dL 5(L)  Creatinine 0.44 - 1.00 mg/dL 0.62  Sodium 135 - 145 mmol/L 135  Potassium 3.5 - 5.1 mmol/L 3.6  Chloride 98 - 111 mmol/L 106  CO2 22 - 32 mmol/L 20(L)  Calcium 8.9 - 10.3 mg/dL 9.0  Total Protein 6.5 - 8.1 g/dL 5.8(L)  Total Bilirubin 0.3 - 1.2 mg/dL 0.7  Alkaline Phos 38 - 126 U/L 78  AST 15 - 41 U/L 12(L)  ALT 0 - 44 U/L 10    Discharge instruction: per After Visit  Summary and "Baby and Me Booklet".  After visit meds:  Allergies as of 01/13/2019   No Known Allergies     Medication List    STOP taking these medications   Blood Pressure Monitor Kit   metoCLOPramide 10 MG tablet Commonly known as: REGLAN   promethazine 12.5 MG tablet Commonly known as: PHENERGAN   terconazole 0.4 % vaginal cream Commonly known as: TERAZOL 7     TAKE these medications   ibuprofen 600 MG tablet Commonly known as: ADVIL Take 1 tablet (600 mg total) by mouth every 6 (six) hours.   measles, mumps & rubella vaccine injection Commonly known as: MMR Inject 0.5 mLs into the skin once for 1 dose.   Prenate Pixie 10-0.6-0.4-200 MG Caps Take 1 tablet by mouth daily.   senna-docusate 8.6-50 MG tablet Commonly known as: Senokot-S Take 2 tablets by mouth daily. Start taking on: January 14, 2019   Tdap 5-2.5-18.5 LF-MCG/0.5 injection Commonly known as: BOOSTRIX Inject 0.5 mLs into the muscle once for 1 dose.       Diet: routine diet  Activity: Advance as tolerated. Pelvic rest for 6 weeks.   Outpatient follow up:4 weeks Follow up Appt:No future appointments. Follow up Visit: Kimberly. Schedule an appointment as soon as  possible for a visit in 4 week(s).   Contact information: [place] [id number] [phone]           Please schedule this patient for PP visit in: 4 weeks Low risk pregnancy complicated by: limited prenatal care Delivery mode:  SVD Anticipated Birth Control:  None. Discussed options including depo vs Nexplanon vs POPs. Patient declined. Her significant other is away traveling for next 9 months. PP Procedures needed: n/a  Schedule Integrated BH visit: no Provider: Any provider   Newborn Data: Live born female  Birth Weight:   APGAR: 51, 9  Newborn Delivery   Birth date/time: 01/11/2019 16:22:00 Delivery type:       Baby Feeding: Breast Disposition:home with mother  Mina Marble, [address], PGY2 01/13/2019  I confirm that I have verified the information documented in the resident's note and that I have also personally reperformed the history, physical exam and all medical decision making activities of this service and have verified that all service and findings are accurately documented in this student's note.   Wende Mott, [place] 01/13/2019 8:46 AM

## 2019-01-11 NOTE — MAU Note (Signed)
SANDAR KRINKE is a 23 y.o. at [redacted]w[redacted]d here in MAU reporting: ongoing contractions and LOF. Was here last night for contractions and was sent home, was 2.5 cm. States contractions are worse, they are every 5 min or less. States about an hour ago she started trickling, fluid is clear, states she is not wearing a pad but her pants feel wet.   Onset of complaint: ongoing, LOF within the past hour

## 2019-01-12 LAB — CBC
HCT: 33.2 % — ABNORMAL LOW (ref 36.0–46.0)
Hemoglobin: 10.7 g/dL — ABNORMAL LOW (ref 12.0–15.0)
MCH: 24.4 pg — ABNORMAL LOW (ref 26.0–34.0)
MCHC: 32.2 g/dL (ref 30.0–36.0)
MCV: 75.6 fL — ABNORMAL LOW (ref 80.0–100.0)
Platelets: 221 10*3/uL (ref 150–400)
RBC: 4.39 MIL/uL (ref 3.87–5.11)
RDW: 15.8 % — ABNORMAL HIGH (ref 11.5–15.5)
WBC: 12.8 10*3/uL — ABNORMAL HIGH (ref 4.0–10.5)
nRBC: 0 % (ref 0.0–0.2)

## 2019-01-12 LAB — RPR: RPR Ser Ql: NONREACTIVE

## 2019-01-12 NOTE — Lactation Note (Signed)
This note was copied from a baby's chart. Lactation Consultation Note  Patient Name: Erin Wilkinson RDEYC'X Date: 01/12/2019 Reason for consult: Other (Comment);Follow-up assessment;Term;Infant weight loss(early D/C - 3 % weight loss)  As LC entered the room per mom baby just fed for 20 mins, mom reports swallows and comfort.  LC changed a wet diaper and handed the baby back to mom and he was content.  Sore nipple and engorgement prevention and tx reviewed. LC instructed mom on the use of the hand pump and increasing the flange due to mom mentioning with her 1st baby she increased to a #36 .  LC sized the #24 F - comfortable fit , the #27 F will be comfortable for when the milk comes in and has plenty of room. #30 - if mom needs it.  Per mom I think the #36 was to large and it effected mom milk supply .  LC stressed the importance of STS feedings and watching the baby for being nutritive when latched and watch for hanging out latched.  Storage of breast milk reviewed.  Mom has the [facility] pamphlet with phone number and [place] resources.    Maternal Data Has patient been taught Hand Expression?: Yes(per mom feels comfortable with technique)  Feeding Feeding Type: (per mom baby recently fed at 1120 for 20 mins)  LATCH Score                   Interventions Interventions: Breast feeding basics reviewed;Shells;Hand pump  Lactation Tools Discussed/Used Tools: Shells;Pump;Flanges Flange Size: 24;27;30(the #67F fits well today -per mom increased when her milk came in - #27 and #30 provided for when milk came in) Shell Type: Inverted Breast pump type: Manual Pump Review: Setup, frequency, and cleaning;Milk Storage Initiated by:: MAI Date initiated:: 01/12/19   Consult Status Consult Status: Complete Date: 01/12/19    Jerlyn Ly Karol Liendo 01/12/2019, 12:15 PM

## 2019-01-12 NOTE — Progress Notes (Signed)
POSTPARTUM PROGRESS NOTE  Post Partum Day 1  Subjective:  Erin Wilkinson is a 23 y.o. G3P3003 s/p 1 at [redacted]w[redacted]d.  She reports she is doing well. No acute events overnight. She denies any problems with ambulating, voiding or po intake. Denies nausea or vomiting.  Pain is well controlled.  Lochia is appropriate.  Objective: Blood pressure 105/71, pulse 67, temperature 98.1 F (36.7 C), temperature source Oral, resp. rate 17, last menstrual period 03/16/2018, SpO2 99 %, unknown if currently breastfeeding.  Physical Exam:  General: alert, cooperative and no distress Chest: no respiratory distress Heart:regular rate, distal pulses intact Abdomen: soft, nontender,  Uterine Fundus: firm, appropriately tender DVT Evaluation: No calf swelling or tenderness Extremities: No LE edema Skin: warm, dry  Recent Labs    01/11/19 1246 01/12/19 0419  HGB 12.0 10.7*  HCT 36.8 33.2*    Assessment/Plan: Erin Wilkinson is a 23 y.o. [phone] s/p SVD at [redacted]w[redacted]d   PPD#1 - Doing well  Routine postpartum care Contraception: Declines  Feeding: Breast  Dispo: Plan for discharge PPD#2 or later today if baby able to be discharged.   LOS: 1 day   Phill Myron, D.O. OB Fellow  01/12/2019, 1:41 PM

## 2019-01-12 NOTE — Anesthesia Postprocedure Evaluation (Signed)
Anesthesia Post Note  Patient: Erin Wilkinson  Procedure(s) Performed: AN AD [place]     Patient location during evaluation: Mother Baby Anesthesia Type: Epidural Level of consciousness: awake and alert and oriented Pain management: satisfactory to patient Vital Signs Assessment: post-procedure vital signs reviewed and stable Respiratory status: respiratory function stable Cardiovascular status: stable Postop Assessment: no headache, no backache, epidural receding, patient able to bend at knees, no signs of nausea or vomiting and adequate PO intake Anesthetic complications: no    Last Vitals:  Vitals:   01/12/19 0330 01/12/19 0730  BP: 97/63 105/71  Pulse: 65 67  Resp: 16 17  Temp: 36.6 C 36.7 C  SpO2: 100% 99%    Last Pain:  Vitals:   01/12/19 0730  TempSrc: Oral  PainSc: 0-No pain   Pain Goal: Patients Stated Pain Goal: 0 (01/11/19 1209)                 Esli Clements

## 2019-01-12 NOTE — Lactation Note (Signed)
This note was copied from a baby's chart. Lactation Consultation Note Baby 33 hrs old. Mom's 3rd child. Mom BF her 25 yr old for 2 months, BF her 25 yr old for 9 months. Mom got pregnant while BF her 53 yr old at 76 months old.  Mom states this baby is latching well, will feed for short time then hold nipple in his mouth and sleep. Newborn feeding habits, STS, I&O, breast massage, supply and demand discussed. Mom states she can hand express colostrum.  Mom has no questions at this time. Lactation brochure given.  Patient Name: Erin Wilkinson Date: 01/12/2019 Reason for consult: Initial assessment;Term   Maternal Data Has patient been taught Hand Expression?: Yes Does the patient have breastfeeding experience prior to this delivery?: Yes  Feeding Feeding Type: Breast Fed  LATCH Score                   Interventions Interventions: Breast feeding basics reviewed  Lactation Tools Discussed/Used WIC Program: No   Consult Status Consult Status: Follow-up Date: 01/13/19 Follow-up type: In-patient    Quaniya Damas, [place] 01/12/2019, 1:02 AM

## 2019-01-13 LAB — CULTURE, BETA STREP (GROUP B ONLY)

## 2019-01-13 MED ORDER — IBUPROFEN 600 MG PO TABS: 600.0000 mg | ORAL_TABLET | Freq: Four times a day (QID) | ORAL | 0 refills | Status: DC

## 2019-01-13 MED ORDER — MEASLES, MUMPS & RUBELLA VAC IJ SOLR: 0.5000 mL | Freq: Once | INTRAMUSCULAR | 0 refills | Status: AC

## 2019-01-13 MED ORDER — TETANUS-DIPHTH-ACELL PERTUSSIS 5-2.5-18.5 LF-MCG/0.5 IM SUSP: 0.5000 mL | Freq: Once | INTRAMUSCULAR | 0 refills | Status: AC

## 2019-01-13 MED ORDER — MEASLES, MUMPS & RUBELLA VAC IJ SOLR: 0.5000 mL | Freq: Once | INTRAMUSCULAR | Status: DC

## 2019-01-13 MED ORDER — SENNOSIDES-DOCUSATE SODIUM 8.6-50 MG PO TABS: 2.0000 | ORAL_TABLET | ORAL | 0 refills | Status: DC

## 2019-01-13 NOTE — Discharge Instructions (Signed)

## 2019-01-14 LAB — GC/CHLAMYDIA PROBE AMP (~~LOC~~) NOT AT ARMC
Chlamydia: NEGATIVE
Neisseria Gonorrhea: NEGATIVE

## 2019-02-10 ENCOUNTER — Ambulatory Visit: Payer: Medicaid Other | Admitting: Obstetrics and Gynecology

## 2019-02-11 ENCOUNTER — Telehealth: Payer: Self-pay | Admitting: Obstetrics and Gynecology

## 2020-04-10 NOTE — L&D Delivery Note (Signed)
OB/GYN Faculty Practice Delivery Note  Erin Wilkinson is a 25 y.o. [id number] s/p NSVD at [redacted]w[redacted]d. She was admitted for early labor, augmented due to postterm.   ROM: 0h 46m with moderate meconium stained fluid GBS Status:  Negative/-- (08/24 0000) Maximum Maternal Temperature: 51F  Labor Progress: Initial SVE: 3/50/-3. She then progressed to complete.   Delivery Date/Time: 12/07/2020 at 19:33 Delivery: Called to room and patient was complete and pushing. Head delivered LOA. No nuchal cord present. Shoulder and body delivered in usual fashion. Infant with spontaneous cry, placed on mother's abdomen, dried and stimulated. Cord clamped x 2 after 1-minute delay, and cut by FOB. Cord blood drawn. Placenta delivered spontaneously with gentle cord traction. Fundus firm with massage and Pitocin. Labia, perineum, vagina, and cervix inspected inspected with no lacerations.  Baby Weight: pending  Placenta: Sent to L&D Complications: None Lacerations: None QBL: 52 mL Analgesia: Epidural   Infant:  APGAR (1 MIN):  9 APGAR (5 MINS):  9 APGAR (10 MINS):     Jen Mow, DO OB [facility] for [employer], [facility] 12/07/2020, 7:50 PM

## 2020-06-30 ENCOUNTER — Ambulatory Visit: Payer: Medicaid Other

## 2020-06-30 ENCOUNTER — Telehealth: Payer: Self-pay | Admitting: Obstetrics

## 2020-06-30 DIAGNOSIS — O0990 Supervision of high risk pregnancy, unspecified, unspecified trimester: Secondary | ICD-10-CM

## 2020-06-30 NOTE — Progress Notes (Signed)
Call patient to start intake assignment. Patient was under the impression this visit would be in person and not the intake nurse. I apologize to patient for the miscommunication and patient got mad and hung up the phone.

## 2020-07-07 ENCOUNTER — Encounter: Payer: Medicaid Other | Admitting: Obstetrics & Gynecology

## 2020-07-08 ENCOUNTER — Other Ambulatory Visit (HOSPITAL_COMMUNITY)
Admission: RE | Admit: 2020-07-08 | Discharge: 2020-07-08 | Disposition: A | Discharge status: Home / Self Care | Payer: Medicaid Other | Source: Ambulatory Visit | Attending: Obstetrics & Gynecology | Admitting: Obstetrics & Gynecology

## 2020-07-08 ENCOUNTER — Ambulatory Visit (INDEPENDENT_AMBULATORY_CARE_PROVIDER_SITE_OTHER): Payer: Medicaid Other

## 2020-07-08 ENCOUNTER — Other Ambulatory Visit: Payer: Self-pay

## 2020-07-08 VITALS — BP 99/64 | HR 87 | Wt 231.0 lb

## 2020-07-08 DIAGNOSIS — Z3490 Encounter for supervision of normal pregnancy, unspecified, unspecified trimester: Secondary | ICD-10-CM | POA: Diagnosis not present

## 2020-07-08 DIAGNOSIS — O99210 Obesity complicating pregnancy, unspecified trimester: Secondary | ICD-10-CM

## 2020-07-08 DIAGNOSIS — Z3A19 19 weeks gestation of pregnancy: Secondary | ICD-10-CM | POA: Insufficient documentation

## 2020-07-08 DIAGNOSIS — Z3492 Encounter for supervision of normal pregnancy, unspecified, second trimester: Secondary | ICD-10-CM

## 2020-07-08 DIAGNOSIS — O09299 Supervision of pregnancy with other poor reproductive or obstetric history, unspecified trimester: Secondary | ICD-10-CM | POA: Diagnosis not present

## 2020-07-08 DIAGNOSIS — Z202 Contact with and (suspected) exposure to infections with a predominantly sexual mode of transmission: Secondary | ICD-10-CM | POA: Insufficient documentation

## 2020-07-08 DIAGNOSIS — Z8632 Personal history of gestational diabetes: Secondary | ICD-10-CM

## 2020-07-08 NOTE — Progress Notes (Signed)
Subjective:   Erin Wilkinson is a 25 y.o. [id number] at [redacted]w[redacted]d by Definite LMP of February 23, 2020 being seen today for her first obstetrical visit.    Gynecological/Obstetrical History: Her obstetrical history is significant for obesity, diet controlled gestational diabetes in 1st pregnancy.  They also suspected macrosomia with baby of ~ 10lbs, but he was only 7lbs 8oz.   Patient does intend to breast feed. Pregnancy history fully reviewed. Patient reports pelvic pain. She reports it "feels like my whole undercarriage is about to fall out."    Sexual Activity and Vaginal Concerns: She reports "I keep getting UTI's and reports having at least 4 with this pregnancy."  She denies current pain or discomfort.  She reports drinking lots of "soda..mostly ginger ale." She denies vaginal discharge, itching, or burning as well as pain or discomfort with sex.  She denies constipation or diarrhea.   Medical History/ROS: She endorses a history of anxiety.  She states it feels it has gotten worse with this pregnancy.  She declines BH referral and anti-anxiety medications.  She denies cardiovascular, respiratory, or hematological disorders. Patient history reveals [place] trait, but is unsure of partner status.   Social History: Patient denies history or cuTommyrrent usage of tobacco, alcohol, or drugs.  Patient reports the FOB is  who is involved and supportive.  Patient reports that she lives with her boys and endorses safety at home.  Patient denies DV/A. Patient is not currently employed.  HISTORY: OB History  Gravida Para Term Preterm AB Living  4 3 3  0 0 3  SAB IAB Ectopic Multiple Live Births  0 0 0 0 3    # Outcome Date GA Lbr Len/2nd Weight Sex Delivery Anes PTL Lv  4 Current           3 Term 01/11/19 [redacted]w[redacted]d 00:49 / 00:12 7 lb 1.6 oz (3.22 kg) M Vag-Spont EPI  LIV     Name: Erin Wilkinson     Apgar1: 9  Apgar5: 9  2 Term 08/15/17 [redacted]w[redacted]d / 00:42 7 lb 8.3 oz (3.41 kg) M Vag-Spont EPI  LIV      Name: Erin Wilkinson     Apgar1: 8  Apgar5: 9  1 Term 02/20/11 [redacted]w[redacted]d 09:45 / 00:28 7 lb 3.7 oz (3.28 kg) M Vag-Spont EPI  LIV     Name: Erin Wilkinson     Apgar1: 9  Apgar5: 9    Last pap smear was done May 2020  and was normal  Past Medical History:  Diagnosis Date  . Asthma   . Dyspnea   . Gestational diabetes   . Gestational diabetes mellitus in pregnancy    new diagnosis 01/2011  . Headache(784.0)   . History of chronic bronchitis   . History of depression   . Syncope    Past Surgical History:  Procedure Laterality Date  . NO PAST SURGERIES     Family History  Problem Relation Age of Onset  . Asthma Mother   . Depression Mother   . Miscarriages / 02/2011 Mother   . Cancer Mother   . Cancer Maternal Uncle   . Diabetes Maternal Grandmother   . Hypertension Maternal Grandmother   . Stroke Paternal Grandmother   . Miscarriages / Stillbirths Sister   . Heart disease Paternal Aunt   . Heart disease Paternal Uncle    Social History   Tobacco Use  . Smoking status: Never Smoker  . Smokeless tobacco: Never Used  Vaping  Use  . Vaping Use: Never used  Substance Use Topics  . Alcohol use: No    Alcohol/week: 0.0 standard drinks  . Drug use: No   No Known Allergies Current Outpatient Medications on File Prior to Visit  Medication Sig Dispense Refill  . ibuprofen (ADVIL) 600 MG tablet Take 1 tablet (600 mg total) by mouth every 6 (six) hours. (Patient not taking: No sig reported) 30 tablet 0  . Prenat-FeAsp-Meth-FA-DHA w/o A (PRENATE PIXIE) 10-0.6-0.4-200 MG CAPS Take 1 tablet by mouth daily. (Patient not taking: Reported on 07/08/2020) 30 capsule 12  . senna-docusate (SENOKOT-S) 8.6-50 MG tablet Take 2 tablets by mouth daily. (Patient not taking: No sig reported) 30 tablet 0   No current facility-administered medications on file prior to visit.    Review of Systems Pertinent items noted in HPI and remainder of comprehensive ROS otherwise  negative.  Exam   Vitals:   07/08/20 1428  BP: 99/64  Pulse: 87  Weight: 231 lb (104.8 kg)   Fetal Heart Rate (bpm): 154  Physical Exam Genitourinary:     Right Labia: No tenderness or lesions.    Left Labia: No tenderness or lesions.    Vaginal exam comments: CV collected blindly.     No cervical motion tenderness.     Uterus is enlarged.     Uterus exam comments: Uterine S=D.  HENT:     Head: Normocephalic and atraumatic.  Eyes:     Conjunctiva/sclera: Conjunctivae normal.  Cardiovascular:     Rate and Rhythm: Normal rate and regular rhythm.     Heart sounds: Normal heart sounds.  Pulmonary:     Effort: Pulmonary effort is normal. No respiratory distress.     Breath sounds: Normal breath sounds.  Abdominal:     General: Bowel sounds are normal.  Musculoskeletal:        General: Normal range of motion.     Cervical back: Normal range of motion.  Neurological:     Mental Status: She is alert and oriented to person, place, and time.  Skin:    General: Skin is warm and dry.  Psychiatric:        Mood and Affect: Mood normal.        Behavior: Behavior normal.        Thought Content: Thought content normal.  Vitals reviewed. Exam conducted with a chaperone present.     Assessment:   26 y.o. year old G59P3003 Patient Active Problem List   Diagnosis Date Noted  . Supervision of normal pregnancy, antepartum 07/08/2020  . Normal labor 01/11/2019  . 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 Sickle cell trait (HCC) 02/26/2017  . Not immune to rubella 02/26/2017  . Asthma 02/19/2017  . Obesity affecting pregnancy, antepartum 08/04/2013     Plan:  1. Encounter for supervision of normal pregnancy, antepartum, unspecified gravidity -Congratulations given and patient welcomed back to practice. -Reviewed modified prenatal visit schedule and platforms used for virtual visits.  -Anticipatory guidance for  prenatal visits including labs, ultrasounds, and testing; Initial labs drawn. -Genetic Screening discussed, NIPS: ordered. -Encouraged to complete MyChart Registration for her ability to review results, send requests, and have questions addressed.  -Discussed estimated due date of November 29, 2020. -Ultrasound discussed; fetal anatomic survey: ordered for next available. -Initiate prenatal vitamins; Rx sent to pharmacy on file.  -Encouraged to seek out care at office or emergency room for urgent and/or emergent concerns. -Educated  on the nature of [facility] with multiple MDs and other Advanced Practice Providers was explained to patient; also emphasized that residents, students are part of our team. Informed of her right to refuse care as she deems appropriate.  -No questions or concerns.    2. [redacted] weeks gestation of pregnancy -Doing well overall. -Patient expresses concern with breast cancer risks.  Reports her mother was diagnosed and treated x 2. -Informed that risk is increased with first degree relative being diagnosed. -Informed that she should probably start mammograms around 27 years of age or depending upon when mother was first diagnosed. -Reassured that it would be addressed more thoroughly during yearly exams.   3. Obesity affecting pregnancy, antepartum -Rx for bASA sent. -BMI 43.6 today -Discussed weight gain of 11-20lbs during pregnancy.  4. History of gestational diabetes in prior pregnancy, currently pregnant -Discussed initiation of bASA for reducing risk of PreEclampsia in pregnancy and PPp. -INformed that HgB A1C would be included in today's labs.  5. 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 -Patient denies history of lesions. -Provider recommends FOB start daily suppressive therapy and that he should follow up with PCP. -Patient offered and agrees to blood testing today.  Informed that results will only inform  us of infection present and not when it was obtained.  Problem list reviewed and updated. Routine obstetric precautions reviewed.  No orders of the defined types were placed in this encounter.   No follow-ups on file.     Cherre Robins, CNM 07/08/2020 2:44 PM

## 2020-07-08 NOTE — Progress Notes (Signed)
NOB  LMP: 02/23/2020 EDD : 11/29/20 Last pap: 09/04/2018 Genetic Screening: YES and wants to know gender.  Pt needs Rx for PNV's  PHQ-9= 0   CC: None

## 2020-07-09 LAB — CERVICOVAGINAL ANCILLARY ONLY
Chlamydia: NEGATIVE
Comment: NEGATIVE
Comment: NEGATIVE
Comment: NORMAL
Neisseria Gonorrhea: NEGATIVE
Trichomonas: NEGATIVE

## 2020-07-09 MED ORDER — ASPIRIN EC 81 MG PO TBEC: 81.0000 mg | DELAYED_RELEASE_TABLET | Freq: Every day | ORAL | 2 refills | Status: DC

## 2020-07-10 LAB — CBC/D/PLT+RPR+RH+ABO+RUB AB...
Antibody Screen: NEGATIVE
Basophils Absolute: 0.1 10*3/uL (ref 0.0–0.2)
Basos: 1 %
EOS (ABSOLUTE): 0.6 10*3/uL — ABNORMAL HIGH (ref 0.0–0.4)
Eos: 5 %
HCV Ab: <0.1 s/co ratio (ref 0.0–0.9)
HIV Screen 4th Generation wRfx: NONREACTIVE
Hematocrit: 35.5 % (ref 34.0–46.6)
Hemoglobin: 11.6 g/dL (ref 11.1–15.9)
Hepatitis B Surface Ag: NEGATIVE
Immature Grans (Abs): 0.1 10*3/uL (ref 0.0–0.1)
Immature Granulocytes: 1 %
Lymphocytes Absolute: 2.7 10*3/uL (ref 0.7–3.1)
Lymphs: 21 %
MCH: 24.8 pg — ABNORMAL LOW (ref 26.6–33.0)
MCHC: 32.7 g/dL (ref 31.5–35.7)
MCV: 76 fL — ABNORMAL LOW (ref 79–97)
Monocytes Absolute: 1 10*3/uL — ABNORMAL HIGH (ref 0.1–0.9)
Monocytes: 8 %
Neutrophils Absolute: 8.3 10*3/uL — ABNORMAL HIGH (ref 1.4–7.0)
Neutrophils: 64 %
Platelets: 286 10*3/uL (ref 150–450)
RBC: 4.67 x10E6/uL (ref 3.77–5.28)
RDW: 15.2 % (ref 11.7–15.4)
RPR Ser Ql: NONREACTIVE
Rh Factor: POSITIVE
Rubella Antibodies, IGG: <0.9 index — ABNORMAL LOW (ref >0.99)
WBC: 12.7 10*3/uL — ABNORMAL HIGH (ref 3.4–10.8)

## 2020-07-10 LAB — HSV(HERPES SMPLX)ABS-I+II(IGG+IGM)-BLD
HSV 1 Glycoprotein G Ab, IgG: 37.8 index — ABNORMAL HIGH (ref 0.00–0.90)
HSV 2 IgG, Type Spec: <0.91 index (ref 0.00–0.90)
HSVI/II Comb IgM: <0.91 Ratio (ref 0.00–0.90)

## 2020-07-10 LAB — COMPREHENSIVE METABOLIC PANEL
ALT: 17 IU/L (ref 0–32)
AST: 17 IU/L (ref 0–40)
Albumin/Globulin Ratio: 1.5 (ref 1.2–2.2)
Albumin: 3.8 g/dL — ABNORMAL LOW (ref 3.9–5.0)
Alkaline Phosphatase: 96 IU/L (ref 44–121)
BUN/Creatinine Ratio: 9 (ref 9–23)
BUN: 5 mg/dL — ABNORMAL LOW (ref 6–20)
Bilirubin Total: 0.2 mg/dL (ref 0.0–1.2)
CO2: 20 mmol/L (ref 20–29)
Calcium: 9.3 mg/dL (ref 8.7–10.2)
Chloride: 103 mmol/L (ref 96–106)
Creatinine, Ser: 0.57 mg/dL (ref 0.57–1.00)
Globulin, Total: 2.5 g/dL (ref 1.5–4.5)
Glucose: 80 mg/dL (ref 65–99)
Potassium: 4.2 mmol/L (ref 3.5–5.2)
Sodium: 136 mmol/L (ref 134–144)
Total Protein: 6.3 g/dL (ref 6.0–8.5)
eGFR: 128 mL/min/{1.73_m2} (ref >59)

## 2020-07-10 LAB — HEMOGLOBIN A1C
Est. average glucose Bld gHb Est-mCnc: 114 mg/dL
Hgb A1c MFr Bld: 5.6 % (ref 4.8–5.6)

## 2020-07-10 LAB — HCV INTERPRETATION

## 2020-07-11 LAB — CULTURE, OB URINE

## 2020-07-11 LAB — URINE CULTURE, OB REFLEX

## 2020-07-22 ENCOUNTER — Encounter: Payer: Medicaid Other | Admitting: Obstetrics

## 2020-08-05 ENCOUNTER — Ambulatory Visit: Payer: Medicaid Other

## 2020-08-05 ENCOUNTER — Encounter: Payer: Self-pay | Admitting: *Deleted

## 2020-08-05 ENCOUNTER — Other Ambulatory Visit: Payer: Self-pay

## 2020-08-05 ENCOUNTER — Ambulatory Visit: Payer: Medicaid Other | Admitting: *Deleted

## 2020-08-05 VITALS — BP 106/58 | HR 82

## 2020-08-05 DIAGNOSIS — Z3A19 19 weeks gestation of pregnancy: Secondary | ICD-10-CM | POA: Diagnosis not present

## 2020-08-05 DIAGNOSIS — Z6835 Body mass index (BMI) 35.0-35.9, adult: Secondary | ICD-10-CM

## 2020-08-05 DIAGNOSIS — Z3490 Encounter for supervision of normal pregnancy, unspecified, unspecified trimester: Secondary | ICD-10-CM | POA: Diagnosis not present

## 2020-08-06 ENCOUNTER — Other Ambulatory Visit: Payer: Self-pay | Admitting: *Deleted

## 2020-08-06 ENCOUNTER — Telehealth: Payer: Medicaid Other

## 2020-08-06 DIAGNOSIS — Z362 Encounter for other antenatal screening follow-up: Secondary | ICD-10-CM

## 2020-08-06 DIAGNOSIS — Z3490 Encounter for supervision of normal pregnancy, unspecified, unspecified trimester: Secondary | ICD-10-CM

## 2020-08-06 NOTE — Progress Notes (Signed)
Erin Wilkinson, CNM reports attempts to call x 2 with no answer.  Patient will be contacted to reschedule.

## 2020-10-07 ENCOUNTER — Ambulatory Visit: Payer: Medicaid Other | Attending: Obstetrics

## 2020-10-07 ENCOUNTER — Ambulatory Visit: Payer: Medicaid Other

## 2020-11-03 ENCOUNTER — Ambulatory Visit: Payer: Medicaid Other | Attending: Obstetrics | Admitting: *Deleted

## 2020-11-03 ENCOUNTER — Ambulatory Visit (HOSPITAL_BASED_OUTPATIENT_CLINIC_OR_DEPARTMENT_OTHER): Payer: Medicaid Other

## 2020-11-03 ENCOUNTER — Other Ambulatory Visit: Payer: Self-pay

## 2020-11-03 VITALS — BP 110/65 | HR 96

## 2020-11-03 DIAGNOSIS — O09293 Supervision of pregnancy with other poor reproductive or obstetric history, third trimester: Secondary | ICD-10-CM | POA: Insufficient documentation

## 2020-11-03 DIAGNOSIS — O0933 Supervision of pregnancy with insufficient antenatal care, third trimester: Secondary | ICD-10-CM | POA: Insufficient documentation

## 2020-11-03 DIAGNOSIS — J45909 Unspecified asthma, uncomplicated: Secondary | ICD-10-CM

## 2020-11-03 DIAGNOSIS — O99513 Diseases of the respiratory system complicating pregnancy, third trimester: Secondary | ICD-10-CM

## 2020-11-03 DIAGNOSIS — Z362 Encounter for other antenatal screening follow-up: Secondary | ICD-10-CM | POA: Insufficient documentation

## 2020-11-03 DIAGNOSIS — Z3A36 36 weeks gestation of pregnancy: Secondary | ICD-10-CM | POA: Insufficient documentation

## 2020-11-03 DIAGNOSIS — O99213 Obesity complicating pregnancy, third trimester: Secondary | ICD-10-CM | POA: Diagnosis not present

## 2020-11-03 DIAGNOSIS — Z148 Genetic carrier of other disease: Secondary | ICD-10-CM | POA: Diagnosis not present

## 2020-11-03 DIAGNOSIS — E669 Obesity, unspecified: Secondary | ICD-10-CM

## 2020-11-03 DIAGNOSIS — Z6835 Body mass index (BMI) 35.0-35.9, adult: Secondary | ICD-10-CM

## 2020-11-03 DIAGNOSIS — Z862 Personal history of diseases of the blood and blood-forming organs and certain disorders involving the immune mechanism: Secondary | ICD-10-CM

## 2020-11-09 ENCOUNTER — Other Ambulatory Visit: Payer: Medicaid Other

## 2020-11-09 ENCOUNTER — Encounter: Payer: Medicaid Other | Admitting: Obstetrics and Gynecology

## 2020-11-09 DIAGNOSIS — Z3490 Encounter for supervision of normal pregnancy, unspecified, unspecified trimester: Secondary | ICD-10-CM

## 2020-11-17 ENCOUNTER — Encounter: Payer: Medicaid Other | Admitting: Obstetrics and Gynecology

## 2020-12-01 ENCOUNTER — Other Ambulatory Visit: Payer: Self-pay

## 2020-12-01 ENCOUNTER — Inpatient Hospital Stay (HOSPITAL_COMMUNITY)
Admission: AD | Admit: 2020-12-01 | Discharge: 2020-12-01 | Disposition: A | Discharge status: Home / Self Care | Payer: Medicaid Other | Attending: Obstetrics & Gynecology | Admitting: Obstetrics & Gynecology

## 2020-12-01 ENCOUNTER — Encounter: Payer: Self-pay | Admitting: Advanced Practice Midwife

## 2020-12-01 ENCOUNTER — Encounter (HOSPITAL_COMMUNITY): Payer: Self-pay | Admitting: Obstetrics & Gynecology

## 2020-12-01 DIAGNOSIS — O480 Post-term pregnancy: Secondary | ICD-10-CM | POA: Diagnosis not present

## 2020-12-01 DIAGNOSIS — O479 False labor, unspecified: Secondary | ICD-10-CM

## 2020-12-01 DIAGNOSIS — R7303 Prediabetes: Secondary | ICD-10-CM | POA: Insufficient documentation

## 2020-12-01 DIAGNOSIS — Z3A40 40 weeks gestation of pregnancy: Secondary | ICD-10-CM | POA: Diagnosis not present

## 2020-12-01 DIAGNOSIS — O471 False labor at or after 37 completed weeks of gestation: Secondary | ICD-10-CM

## 2020-12-01 DIAGNOSIS — O0930 Supervision of pregnancy with insufficient antenatal care, unspecified trimester: Secondary | ICD-10-CM

## 2020-12-01 LAB — COMPREHENSIVE METABOLIC PANEL
ALT: 12 U/L (ref 0–44)
AST: 18 U/L (ref 15–41)
Albumin: 2.6 g/dL — ABNORMAL LOW (ref 3.5–5.0)
Alkaline Phosphatase: 137 U/L — ABNORMAL HIGH (ref 38–126)
Anion gap: 9 (ref 5–15)
BUN: 6 mg/dL (ref 6–20)
CO2: 20 mmol/L — ABNORMAL LOW (ref 22–32)
Calcium: 8.8 mg/dL — ABNORMAL LOW (ref 8.9–10.3)
Chloride: 106 mmol/L (ref 98–111)
Creatinine, Ser: 0.76 mg/dL (ref 0.44–1.00)
GFR, Estimated: >60 mL/min (ref >60)
Glucose, Bld: 112 mg/dL — ABNORMAL HIGH (ref 70–99)
Potassium: 3.6 mmol/L (ref 3.5–5.1)
Sodium: 135 mmol/L (ref 135–145)
Total Bilirubin: 0.7 mg/dL (ref 0.3–1.2)
Total Protein: 5.9 g/dL — ABNORMAL LOW (ref 6.5–8.1)

## 2020-12-01 LAB — CBC
HCT: 34 % — ABNORMAL LOW (ref 36.0–46.0)
Hemoglobin: 11 g/dL — ABNORMAL LOW (ref 12.0–15.0)
MCH: 23.8 pg — ABNORMAL LOW (ref 26.0–34.0)
MCHC: 32.4 g/dL (ref 30.0–36.0)
MCV: 73.4 fL — ABNORMAL LOW (ref 80.0–100.0)
Platelets: 292 10*3/uL (ref 150–400)
RBC: 4.63 MIL/uL (ref 3.87–5.11)
RDW: 15.3 % (ref 11.5–15.5)
WBC: 11.8 10*3/uL — ABNORMAL HIGH (ref 4.0–10.5)
nRBC: 0 % (ref 0.0–0.2)

## 2020-12-01 LAB — HEMOGLOBIN A1C
Hgb A1c MFr Bld: 6.1 % — ABNORMAL HIGH (ref 4.8–5.6)
Mean Plasma Glucose: 128.37 mg/dL

## 2020-12-01 LAB — OB RESULTS CONSOLE GBS: GBS: NEGATIVE

## 2020-12-01 LAB — HIV ANTIBODY (ROUTINE TESTING W REFLEX): HIV Screen 4th Generation wRfx: NONREACTIVE

## 2020-12-01 NOTE — MAU Note (Signed)
Ctx's 10 minutes apart  Denies vaginal bleeding, Denies Lof.   Reports +FM

## 2020-12-01 NOTE — MAU Provider Note (Signed)
  S: [name] is a 25 y.o. [phone] at [redacted]w[redacted]d  who presents to MAU today complaining contractions q 10 minutes . She denies vaginal bleeding. She denies LOF. She reports normal fetal movement.    O: BP 123/74 (BP Location: Left Arm)   Pulse 92   Temp 98.6 F (37 C)   Resp 15   LMP 02/23/2020   SpO2 96%    GENERAL: Well-developed, well-nourished female in no acute distress.  HEAD: Normocephalic, atraumatic.  CHEST: Normal effort of breathing, regular heart rate ABDOMEN: Soft, nontender, gravid  Cervical exam:  Dilation: 1.5 Effacement (%): Thick Cervical Position: Posterior Station: Ballotable Presentation: Undeterminable Exam by:: Thalia Bloodgood, CNM   Fetal Monitoring: Baseline: 150 Variability: Mod Accelerations: 15 x 15 Decelerations: none Contractions: occasional   A: SIUP at [redacted]w[redacted]d  Cat I tracing No cervical change in 90 min of monitoring in MAU Interrupted prenatal care, not seen for regular care since 04/29 Third trimester labs including GBS collected in MAU Patient requested membrane sweep, risks and benefits explained Patient unable to tolerate, membrane sweep attempt discontinued  P: Discharge home in stable condition Patient scheduled for PDIOL Sunday 08/28  Clayton Bibles, CNM 12/01/2020 5:07 PM

## 2020-12-02 LAB — RPR: RPR Ser Ql: NONREACTIVE

## 2020-12-02 LAB — GC/CHLAMYDIA PROBE AMP (~~LOC~~) NOT AT ARMC
Chlamydia: NEGATIVE
Comment: NEGATIVE
Comment: NORMAL
Neisseria Gonorrhea: NEGATIVE

## 2020-12-04 LAB — CULTURE, BETA STREP (GROUP B ONLY)

## 2020-12-05 ENCOUNTER — Inpatient Hospital Stay (HOSPITAL_COMMUNITY)
Admission: AD | Admit: 2020-12-05 | Payer: Medicaid Other | Source: Home / Self Care | Admitting: Obstetrics & Gynecology

## 2020-12-05 ENCOUNTER — Inpatient Hospital Stay (HOSPITAL_COMMUNITY): Payer: Medicaid Other | Attending: Obstetrics & Gynecology

## 2020-12-07 ENCOUNTER — Inpatient Hospital Stay (HOSPITAL_COMMUNITY)
Admission: AD | Admit: 2020-12-07 | Discharge: 2020-12-09 | DRG: 807 | Disposition: A | Discharge status: Home / Self Care | Payer: Medicaid Other | Attending: Family Medicine | Admitting: Family Medicine

## 2020-12-07 ENCOUNTER — Encounter (HOSPITAL_COMMUNITY): Payer: Self-pay | Admitting: Family Medicine

## 2020-12-07 ENCOUNTER — Inpatient Hospital Stay (HOSPITAL_COMMUNITY): Payer: Medicaid Other | Admitting: Anesthesiology

## 2020-12-07 ENCOUNTER — Other Ambulatory Visit: Payer: Self-pay

## 2020-12-07 DIAGNOSIS — O0930 Supervision of pregnancy with insufficient antenatal care, unspecified trimester: Secondary | ICD-10-CM

## 2020-12-07 DIAGNOSIS — Z789 Other specified health status: Secondary | ICD-10-CM | POA: Diagnosis present

## 2020-12-07 DIAGNOSIS — R7303 Prediabetes: Secondary | ICD-10-CM | POA: Diagnosis present

## 2020-12-07 DIAGNOSIS — Z20822 Contact with and (suspected) exposure to covid-19: Secondary | ICD-10-CM | POA: Diagnosis present

## 2020-12-07 DIAGNOSIS — Z3490 Encounter for supervision of normal pregnancy, unspecified, unspecified trimester: Secondary | ICD-10-CM

## 2020-12-07 DIAGNOSIS — O99210 Obesity complicating pregnancy, unspecified trimester: Secondary | ICD-10-CM | POA: Diagnosis present

## 2020-12-07 DIAGNOSIS — Z3A41 41 weeks gestation of pregnancy: Secondary | ICD-10-CM

## 2020-12-07 DIAGNOSIS — O99214 Obesity complicating childbirth: Secondary | ICD-10-CM | POA: Diagnosis present

## 2020-12-07 DIAGNOSIS — O480 Post-term pregnancy: Secondary | ICD-10-CM | POA: Diagnosis present

## 2020-12-07 DIAGNOSIS — Z5900 Homelessness unspecified: Secondary | ICD-10-CM

## 2020-12-07 DIAGNOSIS — O9902 Anemia complicating childbirth: Secondary | ICD-10-CM | POA: Diagnosis present

## 2020-12-07 DIAGNOSIS — D573 Sickle-cell trait: Secondary | ICD-10-CM | POA: Diagnosis present

## 2020-12-07 DIAGNOSIS — O09299 Supervision of pregnancy with other poor reproductive or obstetric history, unspecified trimester: Secondary | ICD-10-CM

## 2020-12-07 LAB — BASIC METABOLIC PANEL
Anion gap: 9 (ref 5–15)
BUN: 6 mg/dL (ref 6–20)
CO2: 20 mmol/L — ABNORMAL LOW (ref 22–32)
Calcium: 8.7 mg/dL — ABNORMAL LOW (ref 8.9–10.3)
Chloride: 105 mmol/L (ref 98–111)
Creatinine, Ser: 0.66 mg/dL (ref 0.44–1.00)
GFR, Estimated: >60 mL/min (ref >60)
Glucose, Bld: 113 mg/dL — ABNORMAL HIGH (ref 70–99)
Potassium: 4.7 mmol/L (ref 3.5–5.1)
Sodium: 134 mmol/L — ABNORMAL LOW (ref 135–145)

## 2020-12-07 LAB — TYPE AND SCREEN
ABO/RH(D): O POS
Antibody Screen: NEGATIVE

## 2020-12-07 LAB — CBC
HCT: 35.2 % — ABNORMAL LOW (ref 36.0–46.0)
Hemoglobin: 11.3 g/dL — ABNORMAL LOW (ref 12.0–15.0)
MCH: 23.9 pg — ABNORMAL LOW (ref 26.0–34.0)
MCHC: 32.1 g/dL (ref 30.0–36.0)
MCV: 74.4 fL — ABNORMAL LOW (ref 80.0–100.0)
Platelets: 294 10*3/uL (ref 150–400)
RBC: 4.73 MIL/uL (ref 3.87–5.11)
RDW: 15.7 % — ABNORMAL HIGH (ref 11.5–15.5)
WBC: 11.2 10*3/uL — ABNORMAL HIGH (ref 4.0–10.5)
nRBC: 0 % (ref 0.0–0.2)

## 2020-12-07 LAB — GLUCOSE, CAPILLARY: Glucose-Capillary: 94 mg/dL (ref 70–99)

## 2020-12-07 LAB — RAPID URINE DRUG SCREEN, HOSP PERFORMED
Amphetamines: NOT DETECTED
Barbiturates: NOT DETECTED
Benzodiazepines: NOT DETECTED
Cocaine: NOT DETECTED
Opiates: NOT DETECTED
Tetrahydrocannabinol: POSITIVE — AB

## 2020-12-07 LAB — RESP PANEL BY RT-PCR (FLU A&B, COVID) ARPGX2
Influenza A by PCR: NEGATIVE
Influenza B by PCR: NEGATIVE
SARS Coronavirus 2 by RT PCR: NEGATIVE

## 2020-12-07 MED ORDER — OXYCODONE-ACETAMINOPHEN 5-325 MG PO TABS: 1.0000 | ORAL_TABLET | ORAL | Status: DC | PRN

## 2020-12-07 MED ORDER — BENZOCAINE-MENTHOL 20-0.5 % EX AERO
1.0000 "application " | INHALATION_SPRAY | CUTANEOUS | Status: DC | PRN
  Administered 2020-12-07: 1 via TOPICAL
  Filled 2020-12-07: qty 56

## 2020-12-07 MED ORDER — LIDOCAINE HCL (PF) 1 % IJ SOLN
30.0000 mL | INTRAMUSCULAR | Status: DC | PRN
Start: 2020-12-07 — End: 2020-12-07

## 2020-12-07 MED ORDER — FENTANYL-BUPIVACAINE-NACL 0.5-0.125-0.9 MG/250ML-% EP SOLN
12.0000 mL/h | EPIDURAL | Status: DC | PRN
  Administered 2020-12-07: 12 mL/h via EPIDURAL

## 2020-12-07 MED ORDER — LACTATED RINGERS IV SOLN: 500.0000 mL | Freq: Once | INTRAVENOUS | Status: DC

## 2020-12-07 MED ORDER — SENNOSIDES-DOCUSATE SODIUM 8.6-50 MG PO TABS
2.0000 | ORAL_TABLET | ORAL | Status: DC
  Administered 2020-12-07 – 2020-12-08 (×2): 2 via ORAL
  Filled 2020-12-07 (×2): qty 2

## 2020-12-07 MED ORDER — OXYTOCIN-SODIUM CHLORIDE 30-0.9 UT/500ML-% IV SOLN
2.5000 [IU]/h | INTRAVENOUS | Status: DC
  Administered 2020-12-07: 2.5 [IU]/h via INTRAVENOUS
  Filled 2020-12-07: qty 500

## 2020-12-07 MED ORDER — ACETAMINOPHEN 500 MG PO TABS
1000.0000 mg | ORAL_TABLET | Freq: Three times a day (TID) | ORAL | Status: DC
  Administered 2020-12-07 – 2020-12-09 (×4): 1000 mg via ORAL
  Filled 2020-12-07 (×4): qty 2

## 2020-12-07 MED ORDER — SIMETHICONE 80 MG PO CHEW: 80.0000 mg | CHEWABLE_TABLET | ORAL | Status: DC | PRN

## 2020-12-07 MED ORDER — ONDANSETRON HCL 4 MG/2ML IJ SOLN: 4.0000 mg | INTRAMUSCULAR | Status: DC | PRN

## 2020-12-07 MED ORDER — PHENYLEPHRINE 40 MCG/ML (10ML) SYRINGE FOR IV PUSH (FOR BLOOD PRESSURE SUPPORT): 80.0000 ug | PREFILLED_SYRINGE | INTRAVENOUS | Status: DC | PRN

## 2020-12-07 MED ORDER — LIDOCAINE HCL (PF) 1 % IJ SOLN
INTRAMUSCULAR | Status: DC | PRN
  Administered 2020-12-07: 8 mL via EPIDURAL

## 2020-12-07 MED ORDER — LACTATED RINGERS IV SOLN: 500.0000 mL | INTRAVENOUS | Status: DC | PRN

## 2020-12-07 MED ORDER — LACTATED RINGERS IV SOLN: INTRAVENOUS | Status: DC

## 2020-12-07 MED ORDER — TERBUTALINE SULFATE 1 MG/ML IJ SOLN: 0.2500 mg | Freq: Once | INTRAMUSCULAR | Status: DC | PRN

## 2020-12-07 MED ORDER — IBUPROFEN 800 MG PO TABS
800.0000 mg | ORAL_TABLET | Freq: Three times a day (TID) | ORAL | Status: DC
  Administered 2020-12-07 – 2020-12-09 (×5): 800 mg via ORAL
  Filled 2020-12-07 (×5): qty 1

## 2020-12-07 MED ORDER — COCONUT OIL OIL: 1.0000 "application " | TOPICAL_OIL | Status: DC | PRN

## 2020-12-07 MED ORDER — DIPHENHYDRAMINE HCL 25 MG PO CAPS: 25.0000 mg | ORAL_CAPSULE | Freq: Four times a day (QID) | ORAL | Status: DC | PRN

## 2020-12-07 MED ORDER — WITCH HAZEL-GLYCERIN EX PADS: 1.0000 "application " | MEDICATED_PAD | CUTANEOUS | Status: DC | PRN

## 2020-12-07 MED ORDER — OXYTOCIN BOLUS FROM INFUSION
333.0000 mL | Freq: Once | INTRAVENOUS | Status: AC
  Administered 2020-12-07: 333 mL via INTRAVENOUS

## 2020-12-07 MED ORDER — DOCUSATE SODIUM 100 MG PO CAPS
100.0000 mg | ORAL_CAPSULE | Freq: Two times a day (BID) | ORAL | Status: DC
  Administered 2020-12-07 – 2020-12-09 (×4): 100 mg via ORAL
  Filled 2020-12-07 (×4): qty 1

## 2020-12-07 MED ORDER — TETANUS-DIPHTH-ACELL PERTUSSIS 5-2.5-18.5 LF-MCG/0.5 IM SUSY: 0.5000 mL | PREFILLED_SYRINGE | Freq: Once | INTRAMUSCULAR | Status: DC

## 2020-12-07 MED ORDER — ZOLPIDEM TARTRATE 5 MG PO TABS: 5.0000 mg | ORAL_TABLET | Freq: Every evening | ORAL | Status: DC | PRN

## 2020-12-07 MED ORDER — ONDANSETRON HCL 4 MG PO TABS: 4.0000 mg | ORAL_TABLET | ORAL | Status: DC | PRN

## 2020-12-07 MED ORDER — MEASLES, MUMPS & RUBELLA VAC IJ SOLR: 0.5000 mL | Freq: Once | INTRAMUSCULAR | Status: DC

## 2020-12-07 MED ORDER — SOD CITRATE-CITRIC ACID 500-334 MG/5ML PO SOLN: 30.0000 mL | ORAL | Status: DC | PRN

## 2020-12-07 MED ORDER — OXYTOCIN-SODIUM CHLORIDE 30-0.9 UT/500ML-% IV SOLN
1.0000 m[IU]/min | INTRAVENOUS | Status: DC
  Administered 2020-12-07: 2 m[IU]/min via INTRAVENOUS

## 2020-12-07 MED ORDER — EPHEDRINE 5 MG/ML INJ: 10.0000 mg | INTRAVENOUS | Status: DC | PRN

## 2020-12-07 MED ORDER — ONDANSETRON HCL 4 MG/2ML IJ SOLN: 4.0000 mg | Freq: Four times a day (QID) | INTRAMUSCULAR | Status: DC | PRN

## 2020-12-07 MED ORDER — OXYCODONE-ACETAMINOPHEN 5-325 MG PO TABS: 2.0000 | ORAL_TABLET | ORAL | Status: DC | PRN

## 2020-12-07 MED ORDER — FENTANYL-BUPIVACAINE-NACL 0.5-0.125-0.9 MG/250ML-% EP SOLN
EPIDURAL | Status: AC
  Filled 2020-12-07: qty 250

## 2020-12-07 MED ORDER — ACETAMINOPHEN 325 MG PO TABS
650.0000 mg | ORAL_TABLET | ORAL | Status: DC | PRN
Start: 2020-12-07 — End: 2020-12-07

## 2020-12-07 MED ORDER — DIBUCAINE (PERIANAL) 1 % EX OINT: 1.0000 "application " | TOPICAL_OINTMENT | CUTANEOUS | Status: DC | PRN

## 2020-12-07 MED ORDER — LACTATED RINGERS IV SOLN
INTRAVENOUS | Status: DC
Start: 2020-12-07 — End: 2020-12-07

## 2020-12-07 MED ORDER — DIPHENHYDRAMINE HCL 50 MG/ML IJ SOLN: 12.5000 mg | INTRAMUSCULAR | Status: DC | PRN

## 2020-12-07 MED ORDER — FENTANYL CITRATE (PF) 100 MCG/2ML IJ SOLN: 100.0000 ug | INTRAMUSCULAR | Status: DC | PRN

## 2020-12-07 MED ORDER — PRENATAL MULTIVITAMIN CH
1.0000 | ORAL_TABLET | Freq: Every day | ORAL | Status: DC
  Administered 2020-12-08 – 2020-12-09 (×2): 1 via ORAL
  Filled 2020-12-07 (×2): qty 1

## 2020-12-07 NOTE — MAU Note (Signed)
Pt reports she started having ctx  this morning . Pt reports feeling ctx q 15 min. Denies any vag bleeding or leaking. Pt stated she had not felt baby move much today.  Had induction scheduled but it was moved to different date

## 2020-12-07 NOTE — H&P (Signed)
Erin Wilkinson is a 26 y.o. female presenting for latent labor, augmentation for postterm.  Patient has had a single prenatal visit at [redacted]w[redacted]d by LMP c/w 23 wk [place]. She has not had sufficient prenatal care, patient states due to homelessness. She is currently living with her sister in the area, her mother is taking care of her other children (two weeks ago were sent with her mother in [place]). Her FOB has been participating during this pregnancy and will be part of the labor and delivery.  +contractions today, no LOF, -VB. +FM. Largest baby born was 7 1/2 lbs.    OB History     Gravida  4   Para  3   Term  3   Preterm      AB      Living  3      SAB      IAB      Ectopic      Multiple  0   Live Births  3          Past Medical History:  Diagnosis Date   Dyspnea    Headache(784.0)    History of chronic bronchitis    History of depression    Syncope    Past Surgical History:  Procedure Laterality Date   NO PAST SURGERIES     Family History: family history includes Asthma in her mother; Cancer in her maternal uncle and mother; Depression in her mother; Diabetes in her maternal grandmother; Heart disease in her paternal aunt and paternal uncle; Hypertension in her maternal grandmother; Miscarriages / [place] in her mother and sister; Stroke in her paternal grandmother. Social History:  reports that she has never smoked. She has never used smokeless tobacco. She reports that she does not drink alcohol and does not use drugs.     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  Review of Systems  Constitutional:  Negative for chills and fever.  Respiratory:  Negative for cough and shortness of breath.   Cardiovascular:  Negative for chest pain.  Gastrointestinal:  Negative for abdominal pain.  Skin:  Negative for rash.  Neurological:  Negative for headaches.  History Dilation: 3 Effacement (%): Thick Station: -3 Exam by:: Johnathan Hausen RN Blood pressure 109/67, pulse 78, temperature 99 F (37.2 C), temperature source Oral, resp. rate 18, height 5\' 1"  (1.549 m), weight 105.2 kg, last menstrual period 02/23/2020, unknown if currently breastfeeding. Exam Physical Exam Constitutional:      General: She is not in acute distress.    Appearance: She is well-developed.  HENT:     Head: Normocephalic and atraumatic.  Cardiovascular:     Rate and Rhythm: Normal rate.  Pulmonary:     Effort: Pulmonary effort is normal. No respiratory distress.  Musculoskeletal:        General: No tenderness.  Skin:    General: Skin is warm and dry.     Findings: No erythema or rash.  Neurological:     Mental Status: She is alert and oriented to person, place, and time.    FHT: 140 bpm, mod var, +accel, no decels TOCO: q3-8 min, irregular Cat I tracing Prenatal labs: ABO, Rh: --/--/O POS (08/30 1458) Antibody: NEG (08/30 1458) Rubella: <0.90 (03/31 1523) RPR: NON REACTIVE (08/24 1634)  HBsAg: Negative (03/31 1523)  HIV: Non Reactive (08/24 1634)  GBS: Negative/-- (08/24 0000)   Assessment/Plan: Erin Wilkinson is a 25 y.o. G4P3003 at [redacted]w[redacted]d by LMP c/w 23 wk [place], here for latent labor to be augmented for postterm.  #Labor:  Latent labor, augmenting with pitocin #Pain: May have epidural if she desires. #FWB:  Cat I #ID:  GBS Neg  #MOF: breast #MOC: IUD at postpartum visit #Limited to no  prenatal care: Only 1 prenatal visit at 19 weeks. Homelessness during pregnancy. Social work will be consulted. UDS on admission. CBG q4h due to no GTT and A1C <1 week ago was 6.1%, with history of GDM in G1 pregnancy.  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    Jen Mow, DO 12/07/2020, 4:36 PM

## 2020-12-07 NOTE — Discharge Summary (Signed)
Postpartum Discharge Summary      Patient Name: Erin Wilkinson DOB: 10/28/1995 MRN: [id number]  Date of admission: 12/07/2020 Delivery date:12/07/2020  Delivering provider: Katherine Basset [facility]  Date of discharge: 12/09/2020  Admitting diagnosis: Post term pregnancy at [redacted] weeks gestation [O48.0, Z3A.41] Intrauterine pregnancy: [redacted]w[redacted]d    Secondary diagnosis:  Principal Problem:   Post term pregnancy at 431weeks gestation Active Problems:   Obesity affecting pregnancy, antepartum   Sickle cell trait ([facility] [place]   Not immune to rubella   Current singleton pregnancy with history of congenital heart disease in prior child, antepartum   Supervision of normal pregnancy, antepartum   Prediabetes   Insufficient prenatal care, delivered, current hospitalization   Homelessness  Additional problems: None    Discharge diagnosis: Term Pregnancy Delivered                                              Post partum procedures: None Augmentation: Pitocin Complications: None  Hospital course: Onset of Labor With Vaginal Delivery      25y.o. yo [id number] [id number] admitted in Latent Labor on 12/07/2020. Patient had an uncomplicated labor course as follows:  Membrane Rupture Time/Date: 7:26 PM ,12/07/2020   Delivery Method:Vaginal, Spontaneous  Episiotomy: None  Lacerations:  None  Patient had an uncomplicated postpartum course.  She is ambulating, tolerating a regular diet, passing flatus, and urinating well. Patient is discharged home in stable condition on 12/09/20.  Newborn Data: Birth date:12/07/2020  Birth time:7:33 PM  Gender:Female  Living status:Living  Apgars:9 ,9  Weight:3096 g   Magnesium Sulfate received: No BMZ received: No Rhophylac:N/A MMR:Yes T-DaP:Given postpartum Flu: N/A Transfusion:No  Physical exam  Vitals:   12/08/20 1115 12/08/20 1430 12/08/20 2105 12/09/20 0535  BP: 109/70 119/78 113/72 (!) 97/58  Pulse: 82 76 64 67  Resp: '18 17 18 18  ' Temp: 98 F (36.7  C) 98 F (36.7 C) 98.5 F (36.9 C) 98.6 F (37 C)  TempSrc: Oral Oral Oral Oral  SpO2: 97% 98%  100%  Weight:      Height:       General: alert, cooperative, and no distress Lochia: appropriate Uterine Fundus: firm Incision: N/A DVT Evaluation: No evidence of DVT seen on physical exam. Negative Homan's sign. No cords or calf tenderness. No significant calf/ankle edema. Labs: Lab Results  Component Value Date   WBC 11.2 (H) 12/07/2020   HGB 11.3 (L) 12/07/2020   HCT 35.2 (L) 12/07/2020   MCV 74.4 (L) 12/07/2020   PLT 294 12/07/2020   CMP Latest Ref Rng & Units 12/07/2020  Glucose 70 - 99 mg/dL 113(H)  BUN 6 - 20 mg/dL 6  Creatinine 0.44 - 1.00 mg/dL 0.66  Sodium 135 - 145 mmol/L 134(L)  Potassium 3.5 - 5.1 mmol/L 4.7  Chloride 98 - 111 mmol/L 105  CO2 22 - 32 mmol/L 20(L)  Calcium 8.9 - 10.3 mg/dL 8.7(L)  Total Protein 6.5 - 8.1 g/dL -  Total Bilirubin 0.3 - 1.2 mg/dL -  Alkaline Phos 38 - 126 U/L -  AST 15 - 41 U/L -  ALT 0 - 44 U/L -   Edinburgh Score: Edinburgh Postnatal Depression Scale Screening Tool 01/13/2019  I have been able to laugh and see the funny side of things. 0  I have looked forward with enjoyment to things. 0  I have blamed myself unnecessarily when things went wrong. 0  I have been anxious or worried for no good reason. 1  I have felt scared or panicky for no good reason. 1  Things have been getting on top of me. 0  I have been so unhappy that I have had difficulty sleeping. 0  I have felt sad or miserable. 0  I have been so unhappy that I have been crying. 0  The thought of harming myself has occurred to me. 0  Edinburgh Postnatal Depression Scale Total 2     After visit meds:  Allergies as of 12/09/2020   No Known Allergies      Medication List     STOP taking these medications    aspirin EC 81 MG tablet       TAKE these medications    Prenate Pixie 10-0.6-0.4-200 MG Caps Take 1 tablet by mouth daily.   senna-docusate  8.6-50 MG tablet Commonly known as: Senokot-S Take 2 tablets by mouth daily.         Discharge home in stable condition Infant Feeding: Breast Infant Disposition:home with mother Discharge instruction: per After Visit Summary and Postpartum booklet. Activity: Advance as tolerated. Pelvic rest for 6 weeks.  Diet: routine diet Future Appointments:No future appointments.  Follow up Visit: sent 12/09/2020   Please schedule this patient for a In person postpartum visit in 4 weeks with the following provider: Any provider. Additional Postpartum F/U:Postpartum Depression checkup in 1 week. Low risk pregnancy complicated by:  insufficient prenatal care, homelessness Delivery mode:  Vaginal, Spontaneous  Anticipated Birth Control:  IUD   12/09/2020 Gifford Shave, MD

## 2020-12-07 NOTE — Progress Notes (Signed)
Pt expressed to RN that she is thinking about giving baby to her cousin and aunt to raise.  She stated that she is unsure if she wants to do this but feels like it will be best for baby.  No adoption paperwork has been started.  Pt stated that father of baby is not on board with baby going to her cousin and aunt.  Pt expressed that she is stressed because she is in between housing and currently staying with her sister.  Pt stated she does not have anything set up to bring the baby home.  Md aware and social work consult in.

## 2020-12-07 NOTE — Anesthesia Preprocedure Evaluation (Signed)
Anesthesia Evaluation  Patient identified by MRN, date of birth, ID band  Reviewed: Allergy & Precautions, NPO status , Patient's Chart, lab work & pertinent test results  Airway Mallampati: III  TM Distance: >3 FB Neck ROM: Full    Dental no notable dental hx.    Pulmonary asthma ,    Pulmonary exam normal breath sounds clear to auscultation       Cardiovascular negative cardio ROS Normal cardiovascular exam Rhythm:Regular Rate:Normal     Neuro/Psych  Headaches, negative psych ROS   GI/Hepatic negative GI ROS, Neg liver ROS,   Endo/Other  Morbid obesity  Renal/GU negative Renal ROS  negative genitourinary   Musculoskeletal negative musculoskeletal ROS (+)   Abdominal   Peds negative pediatric ROS (+)  Hematology negative hematology ROS (+)   Anesthesia Other Findings   Reproductive/Obstetrics negative OB ROS                             Anesthesia Physical Anesthesia Plan  ASA: 3  Anesthesia Plan: Epidural   Post-op Pain Management:    Induction:   PONV Risk Score and Plan: 2  Airway Management Planned: Natural Airway  Additional Equipment:   Intra-op Plan:   Post-operative Plan:   Informed Consent: I have reviewed the patients History and Physical, chart, labs and discussed the procedure including the risks, benefits and alternatives for the proposed anesthesia with the patient or authorized representative who has indicated his/her understanding and acceptance.       Plan Discussed with: Anesthesiologist  Anesthesia Plan Comments:         Anesthesia Quick Evaluation

## 2020-12-07 NOTE — Anesthesia Procedure Notes (Signed)
Epidural Patient location during procedure: OB Start time: 12/07/2020 4:45 PM End time: 12/07/2020 4:55 PM  Staffing Anesthesiologist: Mellody Dance, MD Performed: anesthesiologist   Preanesthetic Checklist Completed: patient identified, IV checked, site marked, risks and benefits discussed, monitors and equipment checked, pre-op evaluation and timeout performed  Epidural Patient position: sitting Prep: DuraPrep Patient monitoring: heart rate, cardiac monitor, continuous pulse ox and blood pressure Approach: midline Location: L2-L3 Injection technique: LOR saline  Needle:  Needle type: Tuohy  Needle gauge: 17 G Needle length: 9 cm Needle insertion depth: 7 cm Catheter type: closed end flexible Catheter size: 20 Guage Catheter at skin depth: 12 cm Test dose: negative and Other  Assessment Events: blood not aspirated, injection not painful, no injection resistance and negative IV test  Additional Notes Informed consent obtained prior to proceeding including risk of failure, 1% risk of PDPH, risk of minor discomfort and bruising.  Discussed rare but serious complications including epidural abscess, permanent nerve injury, epidural hematoma.  Discussed alternatives to epidural analgesia and patient desires to proceed.  Timeout performed pre-procedure verifying patient name, procedure, and platelet count.  Patient tolerated procedure well.

## 2020-12-08 LAB — RPR: RPR Ser Ql: NONREACTIVE

## 2020-12-08 MED ORDER — MEDROXYPROGESTERONE ACETATE 150 MG/ML IM SUSP: 150.0000 mg | Freq: Once | INTRAMUSCULAR | Status: DC

## 2020-12-08 NOTE — Progress Notes (Signed)
Post Partum Day 1 Subjective: Erin Wilkinson  is a 24 y.o. [id number] s/p SVD at [redacted]w[redacted]d.  She reports she is doing well. No acute events overnight. She denies any problems with ambulating, voiding or po intake. Denies nausea or vomiting.  Pain is well controlled on ibuprofen.  Lochia is moderate and improving.  Objective: Blood pressure (!) 88/54, pulse 73, temperature 98.2 F (36.8 C), temperature source Oral, resp. rate 18, height 5\' 1"  (1.549 m), weight 105.2 kg, last menstrual period 02/23/2020, SpO2 100 %, currently breastfeeding.  Physical Exam:  General: alert, cooperative, and no distress Lochia: appropriate Uterine Fundus: firm, midline U-1 Incision: n/a DVT Evaluation: No evidence of DVT seen on physical exam. Negative Homan's sign. No cords or calf tenderness. No significant calf/ankle edema.  Recent Labs    12/07/20 1458  HGB 11.3*  HCT 35.2*    Assessment/Plan: Plan for discharge tomorrow, Social Work consult, Circumcision prior to discharge, and Contraception outpatient IUD   LOS: 1 day   12/09/20, CNM 12/08/2020, 7:01 AM

## 2020-12-08 NOTE — Anesthesia Postprocedure Evaluation (Signed)
Anesthesia Post Note  Patient: Erin Wilkinson  Procedure(s) Performed: AN AD HOC LABOR EPIDURAL     Patient location during evaluation: Mother Baby Anesthesia Type: Epidural Level of consciousness: awake and alert and oriented Pain management: satisfactory to patient Vital Signs Assessment: post-procedure vital signs reviewed and stable Respiratory status: respiratory function stable Cardiovascular status: stable Postop Assessment: no headache, no backache, epidural receding, patient able to bend at knees, no signs of nausea or vomiting, adequate PO intake and able to ambulate Anesthetic complications: no   No notable events documented.  Last Vitals:  Vitals:   12/08/20 0230 12/08/20 0628  BP: (!) 95/53 (!) 88/54  Pulse: 74 73  Resp: 18 18  Temp: 36.9 C 36.8 C  SpO2: 100% 100%    Last Pain:  Vitals:   12/08/20 0832  TempSrc:   PainSc: 0-No pain   Pain Goal:                   Robby Bulkley

## 2020-12-08 NOTE — Clinical Social Work Maternal (Signed)
CLINICAL SOCIAL WORK MATERNAL/CHILD NOTE  Patient Details  Name: Erin Wilkinson MRN: [id number] Date of Birth: 05/11/1995  Date:  12/08/2020  Clinical Social Worker Initiating Note:  Darra Lis, [place] Date/Time: Initiated:  12/08/20/0930     Child's Name:  Undecided   Biological Parents:  Mother, Father Erin Wilkinson 27)   Need for Interpreter:  None   Reason for Referral:  Late or No Prenatal Care  , Homelessness, Behavioral Health Concerns   Address:  [address] [place] [address]    Phone number:  [phone] (home)     Additional phone number:   Household Members/Support Persons (HM/SP):   Household Member/Support Person 1, Household Member/Support Person 2, Household Member/Support Person 3, Household Member/Support Person 4   HM/SP Name Relationship DOB or Age  HM/SP -1 Erin Wilkinson 08/19/1989  HM/SP -2 Erin Wilkinson  (currently with [employer] in [facility]) Son 02/20/2011  HM/SP -[place] (currently with [facility] in [facility] [place]) Son 08/15/2017  HM/SP -[place]  (currently with [facility] in [facility]) Son 01/11/2019  HM/SP -5  Four of sister's children reside in the home, MOB declined to provide their information.      HM/SP -6        HM/SP -7        HM/SP -8          Natural Supports (not living in the home):  Parent, Immediate Family   Professional Supports: None   Employment: Unemployed   Type of Work:     Education:  [place] arranged:    Museum/gallery curator Resources:  [employer]   Other Resources:  Arts development officer Considerations Which May Impact Care:   Strengths:  Ability to meet basic needs  , Home prepared for child     Psychotropic Medications:         Pediatrician:       Pediatrician List:   [place]      Pediatrician Fax Number:    Risk Factors/Current Problems:  Other (Comment) (Unstable Housing)    Cognitive State:  Alert  , Linear Thinking  , Insightful     Mood/Affect:  Interested  , Calm     CSW Assessment: CSW consulted for [facility], anxiety/depression, and possible homelessness. CSW met with MOB to assess and offer support. CSW introduced self and role. CSW observed infant sleeping in bassinet. MOB was engaged and receptive of visit. CSW informed MOB of reason for consult. MOB was understanding and reported she had limited prenatal care due to unstable housing during pregnancy. MOB stated she was back and forth between her mother's Erin Wilkinson) home in [place] and her sister's home in [place]. MOB reported she is currently living with her sister in [place] and she plans to discharge to her sister's home with infant. MOB disclosed her three children have been living with her mother the last week and the oldest will stay there for the school year. MOB stated her two younger children will return home with her postpartum. CSW expressed understanding and informed MOB of the hospital drug screen policy. MOB aware an UDS/CDS is performed on infant and a CPS report will be made if infant test positive for substances. MOB was understanding and denies substance use or CPS history.  CSW inquired on MOB possibly giving  infant up for adoption. MOB reported she is considering having infant live with her cousin in [place], [place] for the first year of life until she is stable. CSW asked MOB when she plans to make the transition for infant. MOB stated within the first week postpartum. MOB reported she is not giving up parental rights or planning to have infant legally adopted. CSW expressed understanding and informed MOB that infant will have to be seen by a provider within the first 48 hours. MOB was understanding and stated she has looked into the documentation necessary to ensure her cousin Wilkinson make medical decisions for infant when in [place]. CSW asked MOB about FOB's involvement. MOB stated  FOB is involved and he will be signing the birth certificate. MOB reported FOB is not in agreement with infant living with anyone else at the moment. FOB has stable housing, according to MOB.   CSW discussed MOB mental health history and inquired on current emotions. MOB disclosed she was diagnosed with anxiety and depression, however she is unsure of when exactly. MOB shared she had a difficult pregnancy due to the social challenges and is currently feeling a lot better. MOB expressed some anxiousness, however stated it is not unmanageable. MOB stated she is feeling well bonded with infant. MOB reported she attended therapy in middle school, which was helpful. MOB has never been on medication and expressed she is open to therapy if needs arise postpartum. MOB identified her sister and mother as primary supports. MOB denies any current SI, HI or being involved in DV.   CSW provided education regarding the baby blues period versus PPD and provided resources. CSW provided the New Mom Checklist and encouraged MOB to self evaluate and contact a medical professional if symptoms are noted at any time.   CSW provided review of Sudden Infant Death Syndrome (SIDS) precautions. MOB reported she has a crib for infant. MOB stated she is in need of a car seat, which CSW will provide. MOB receptive to a referral for [employer] for additional resources. MOB denies any transportation barriers to infant's follow-up care. MOB denies any additional needs at this time.   CSW will follow UDS/CDS and make a CPS report if warranted. CSW identifies no further need for intervention and no barriers to discharge at this time.  CSW Plan/Description:  No Further Intervention Required/No Barriers to Discharge, CSW Will Continue to Monitor Umbilical Cord Tissue Drug Screen Results and Make Report if Warranted, Child Protective Service Report  , [place], CSW Awaiting CPS Disposition Plan, Sudden Infant  Death Syndrome (SIDS) Education, Perinatal Mood and Anxiety Disorder (PMADs) Education, Other Information/Referral to [employer], [place] 12/08/2020, 10:43 AM

## 2020-12-09 MED ORDER — SUCROSE 24% NICU/PEDS ORAL SOLUTION: 0.5000 mL | OROMUCOSAL | Status: DC | PRN

## 2020-12-09 MED ORDER — EPINEPHRINE TOPICAL FOR CIRCUMCISION 0.1 MG/ML
1.0000 [drp] | TOPICAL | Status: DC | PRN
  Filled 2020-12-09: qty 1

## 2020-12-09 MED ORDER — ACETAMINOPHEN FOR CIRCUMCISION 160 MG/5 ML
40.0000 mg | Freq: Once | ORAL | Status: DC
  Filled 2020-12-09: qty 1.25

## 2020-12-09 MED ORDER — WHITE PETROLATUM EX OINT: 1.0000 "application " | TOPICAL_OINTMENT | CUTANEOUS | Status: DC | PRN

## 2020-12-09 MED ORDER — ACETAMINOPHEN FOR CIRCUMCISION 160 MG/5 ML
40.0000 mg | ORAL | Status: DC | PRN
  Filled 2020-12-09: qty 1.25

## 2020-12-09 MED ORDER — LIDOCAINE 1% INJECTION FOR CIRCUMCISION
0.8000 mL | INJECTION | Freq: Once | INTRAVENOUS | Status: DC
  Filled 2020-12-09 (×2): qty 1

## 2020-12-21 ENCOUNTER — Telehealth (HOSPITAL_COMMUNITY): Payer: Self-pay

## 2020-12-21 NOTE — Telephone Encounter (Signed)
"  I'm doing good." Patient has no questions or concerns about her healing.  "He's good. He is eating a lot, gaining weight. We have been to the pediatrician. He sleeps in a crib."RN reviewed ABC's of safe sleep with patient. Patient declines any questions or concerns about baby.  EPDS score is 4.  Marcelino Duster [facility] 12/21/2020,1842

## 2021-01-18 ENCOUNTER — Ambulatory Visit: Payer: Medicaid Other

## 2021-02-22 ENCOUNTER — Ambulatory Visit: Payer: Medicaid Other | Admitting: Obstetrics and Gynecology

## 2021-11-11 ENCOUNTER — Ambulatory Visit
Admission: EM | Admit: 2021-11-11 | Discharge: 2021-11-11 | Disposition: A | Discharge status: Home / Self Care | Payer: Medicaid Other | Attending: Emergency Medicine | Admitting: Emergency Medicine

## 2021-11-11 DIAGNOSIS — B9689 Other specified bacterial agents as the cause of diseases classified elsewhere: Secondary | ICD-10-CM | POA: Insufficient documentation

## 2021-11-11 DIAGNOSIS — N760 Acute vaginitis: Secondary | ICD-10-CM | POA: Insufficient documentation

## 2021-11-11 DIAGNOSIS — Z113 Encounter for screening for infections with a predominantly sexual mode of transmission: Secondary | ICD-10-CM

## 2021-11-11 DIAGNOSIS — B3731 Acute candidiasis of vulva and vagina: Secondary | ICD-10-CM | POA: Insufficient documentation

## 2021-11-11 LAB — PREGNANCY, URINE: Preg Test, Ur: NEGATIVE

## 2021-11-11 LAB — URINALYSIS, ROUTINE W REFLEX MICROSCOPIC
Glucose, UA: NEGATIVE mg/dL
Ketones, ur: NEGATIVE mg/dL
Nitrite: NEGATIVE
Specific Gravity, Urine: >1.03 — ABNORMAL HIGH (ref 1.005–1.030)
pH: 5 (ref 5.0–8.0)

## 2021-11-11 LAB — WET PREP, GENITAL
Sperm: NONE SEEN
Trich, Wet Prep: NONE SEEN
WBC, Wet Prep HPF POC: <10 — AB (ref <10)

## 2021-11-11 LAB — URINALYSIS, MICROSCOPIC (REFLEX)

## 2021-11-11 MED ORDER — FLUCONAZOLE 150 MG PO TABS: 150.0000 mg | ORAL_TABLET | Freq: Once | ORAL | 1 refills | Status: AC

## 2021-11-11 MED ORDER — METRONIDAZOLE 500 MG PO TABS: 500.0000 mg | ORAL_TABLET | Freq: Two times a day (BID) | ORAL | 0 refills | Status: AC

## 2021-11-11 NOTE — Discharge Instructions (Addendum)
Your wet prep was positive for BV and yeast.  Finish the Flagyl, which is for BV, and the Diflucan, which is for yeast.  Take 1 pill the Diflucan today, and if you are still having symptoms in 72 hours, you may repeat another dose.  I have given you a refill in case you get another yeast infection.  The rest of your labs will be back in several days.  We will base further treatment off of these labs.  Refrain from intercourse until all of your labs have been resulted, and you and your partner/partners have been treated appropriately.  Give [place] a working phone number so that we can contact you if needed.  Please follow-up with an OB/GYN of your choice regarding that bump on your labia.

## 2021-11-11 NOTE — ED Provider Notes (Signed)
HPI  SUBJECTIVE:  Erin Wilkinson is a 26 y.o. female who presents with 1 week of odorous vaginal discharge, itching, irritation.  She states that she "feels raw".  She denies rash or blisters.  She has had a "skin bump" between her inner and external labia that has been there for months.  It has not changed recently.  She reports cloudy, odorous urine, and states that she had 2 episodes where she noted pink on the toilet paper after wiping.  She denies vaginal bleeding.  She reports low abdominal midline pain earlier this week, but it has since resolved.  No vomiting, fevers, back pain.  She states that her boyfriend has been cheating on her, and he has herpes.  He has had herpes for some time.  She is not sure if he is having any symptoms.  Patient does not have any other sexual partners.  No recent antibiotics.  No alleviating factors.  Patient has not tried anything for symptoms.  Symptoms are worse with showering. Patient has a past medical history of chlamydia, trichomonas, BV, vaginal yeast infections, gestational diabetes, UTI.  No history of gonorrhea, HIV, HSV, syphilis, HPV, pyelonephritis.  LMP: 6/28.  Unsure if she could be pregnant.  PCP: [facility].  Past Medical History:  Diagnosis Date   Dyspnea    Headache(784.0)    History of chronic bronchitis    History of depression    Syncope     Past Surgical History:  Procedure Laterality Date   NO PAST SURGERIES      Family History  Problem Relation Age of Onset   Asthma Mother    Depression Mother    Miscarriages / Stillbirths Mother    Cancer Mother    Cancer Maternal Uncle    Diabetes Maternal Grandmother    Hypertension Maternal Grandmother    Stroke Paternal Grandmother    Miscarriages / Stillbirths Sister    Heart disease Paternal Aunt    Heart disease Paternal Uncle     Social History   Tobacco Use   Smoking status: Never   Smokeless tobacco: Never  Vaping Use   Vaping Use: Never used  Substance Use  Topics   Alcohol use: No    Alcohol/week: 0.0 standard drinks of alcohol   Drug use: No    No current facility-administered medications for this encounter.  Current Outpatient Medications:    fluconazole (DIFLUCAN) 150 MG tablet, Take 1 tablet (150 mg total) by mouth once for 1 dose. 1 tab po x 1. May repeat in 72 hours if no improvement, Disp: 2 tablet, Rfl: 1   metroNIDAZOLE (FLAGYL) 500 MG tablet, Take 1 tablet (500 mg total) by mouth 2 (two) times daily for 7 days., Disp: 14 tablet, Rfl: 0  No Known Allergies   ROS  As noted in HPI.   Physical Exam  BP (!) 118/92 (BP Location: Left Arm)   Pulse 95   Temp 98.9 F (37.2 C) (Oral)   Resp 18   Ht 5\' 1"  (1.549 m)   Wt 99.8 kg   LMP 10/01/2021   SpO2 98%   BMI 41.57 kg/m   Constitutional: Well developed, well nourished, no acute distress Eyes:  EOMI, conjunctiva normal bilaterally HENT: Normocephalic, atraumatic,mucus membranes moist Respiratory: Normal inspiratory effort Cardiovascular: Normal rate GI: nondistended soft, nontender GU: Erythematous, irritated, tender spot left inner labia.  Positive mass in the 1 o'clock position along external aspect of inner labia suggestive of a wart.  Skin appears intact.  No obvious blisters or ulcers.  Normal vaginal mucosa.  Parous os with clear discharge.  No appreciable odor.  No vaginal bleeding.  No CMT. no adnexal tenderness or masses.  Uterus smooth, nontender.  Chaperone present during exam. skin: No rash, skin intact Musculoskeletal: no deformities Neurologic: Alert & oriented x 3, no focal neuro deficits Psychiatric: Speech and behavior appropriate   ED Course   Medications - No data to display  Orders Placed This Encounter  Procedures   Pelvic exam    Standing Status:   Standing    Number of Occurrences:   1   Wet prep, genital    Standing Status:   Standing    Number of Occurrences:   1   Hsv Culture And Typing    Standing Status:   Standing    Number of  Occurrences:   1    Order Specific Question:   Patient immune status    Answer:   Normal   Urinalysis, Routine w reflex microscopic Urine, Clean Catch    Standing Status:   Standing    Number of Occurrences:   1   Pregnancy, urine    Standing Status:   Standing    Number of Occurrences:   1   HIV Antibody (routine testing w rflx)    Standing Status:   Standing    Number of Occurrences:   1   RPR    Standing Status:   Standing    Number of Occurrences:   1   Urinalysis, Microscopic (reflex)    Standing Status:   Standing    Number of Occurrences:   1    Results for orders placed or performed during the hospital encounter of 11/11/21 (from the past 24 hour(s))  Pregnancy, urine     Status: None   Collection Time: 11/11/21  7:35 PM  Result Value Ref Range   Preg Test, Ur NEGATIVE NEGATIVE  Wet prep, genital     Status: Abnormal   Collection Time: 11/11/21  7:37 PM   Specimen: Vaginal  Result Value Ref Range   Yeast Wet Prep HPF POC PRESENT (A) NONE SEEN   Trich, Wet Prep NONE SEEN NONE SEEN   Clue Cells Wet Prep HPF POC PRESENT (A) NONE SEEN   WBC, Wet Prep HPF POC <10 (A) <10   Sperm NONE SEEN   Urinalysis, Routine w reflex microscopic Urine, Clean Catch     Status: Abnormal   Collection Time: 11/11/21  7:37 PM  Result Value Ref Range   Color, Urine YELLOW YELLOW   APPearance HAZY (A) CLEAR   Specific Gravity, Urine >1.030 (H) 1.005 - 1.030   pH 5.0 5.0 - 8.0   Glucose, UA NEGATIVE NEGATIVE mg/dL   Hgb urine dipstick TRACE (A) NEGATIVE   Bilirubin Urine SMALL (A) NEGATIVE   Ketones, ur NEGATIVE NEGATIVE mg/dL   Protein, ur TRACE (A) NEGATIVE mg/dL   Nitrite NEGATIVE NEGATIVE   Leukocytes,Ua TRACE (A) NEGATIVE  Urinalysis, Microscopic (reflex)     Status: Abnormal   Collection Time: 11/11/21  7:37 PM  Result Value Ref Range   RBC / HPF 6-10 0 - 5 RBC/hpf   WBC, UA 21-50 0 - 5 WBC/hpf   Bacteria, UA MANY (A) NONE SEEN   Squamous Epithelial / LPF 11-20 0 - 5   No  results found.  ED Clinical Impression  1. BV (bacterial vaginosis)   2. Yeast vaginitis   3. Screening for STDs (sexually transmitted diseases)  ED Assessment/Plan  Previous records, labs reviewed.  As noted in HPI.  Urine pregnancy negative.  Patient has BV and yeast.  Sending off HIV, HSV, RPR, gonorrhea and chlamydia.  We will treat based on lab results.  While she does have an irritated area on her inner left labia, does not appear to be obviously herpetic.  Will send home with Flagyl, Diflucan.  Patient has trace hematuria, leukocytes.  She has many bacteria, but it is a contaminated specimen.  We will send this off for culture prior to initiating antibiotic treatment for urinary tract infection. Follow-up with PCP as needed.  Advised follow-up with GYN about the labial mass, which could be a genital wart versus skin tag.  Discussed labs,  MDM, treatment plan, and plan for follow-up with patient.patient agrees with plan.   Meds ordered this encounter  Medications   metroNIDAZOLE (FLAGYL) 500 MG tablet    Sig: Take 1 tablet (500 mg total) by mouth 2 (two) times daily for 7 days.    Dispense:  14 tablet    Refill:  0   fluconazole (DIFLUCAN) 150 MG tablet    Sig: Take 1 tablet (150 mg total) by mouth once for 1 dose. 1 tab po x 1. May repeat in 72 hours if no improvement    Dispense:  2 tablet    Refill:  1      *This clinic note was created using Scientist, clinical (histocompatibility and immunogenetics). Therefore, there may be occasional mistakes despite careful proofreading.  ?    Domenick Gong, MD 11/11/21 2049

## 2021-11-11 NOTE — ED Triage Notes (Signed)
Pt c/o vaginal odor and vaginal itching x1week  Pt states that her boyfriend cheated and that he had genital herpes.

## 2021-11-12 LAB — HIV ANTIBODY (ROUTINE TESTING W REFLEX): HIV Screen 4th Generation wRfx: NONREACTIVE

## 2021-11-13 LAB — RPR: RPR Ser Ql: NONREACTIVE

## 2021-11-14 LAB — CERVICOVAGINAL ANCILLARY ONLY
Chlamydia: NEGATIVE
Comment: NEGATIVE
Comment: NEGATIVE
Comment: NORMAL
Neisseria Gonorrhea: NEGATIVE
Trichomonas: NEGATIVE

## 2021-11-17 LAB — HSV CULTURE AND TYPING

## 2022-08-22 ENCOUNTER — Ambulatory Visit
Admission: RE | Admit: 2022-08-22 | Discharge: 2022-08-22 | Disposition: A | Discharge status: Home / Self Care | Payer: Self-pay | Source: Ambulatory Visit | Attending: Emergency Medicine | Admitting: Emergency Medicine

## 2022-08-22 ENCOUNTER — Ambulatory Visit (INDEPENDENT_AMBULATORY_CARE_PROVIDER_SITE_OTHER): Payer: Self-pay

## 2022-08-22 DIAGNOSIS — S63501A Unspecified sprain of right wrist, initial encounter: Secondary | ICD-10-CM

## 2022-08-22 DIAGNOSIS — M67431 Ganglion, right wrist: Secondary | ICD-10-CM
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
## 2022-08-28 ENCOUNTER — Ambulatory Visit
Admission: EM | Admit: 2022-08-28 | Discharge: 2022-08-28 | Disposition: A | Discharge status: Home / Self Care | Payer: Self-pay | Attending: Internal Medicine | Admitting: Internal Medicine

## 2022-08-28 ENCOUNTER — Other Ambulatory Visit: Payer: Self-pay

## 2022-08-28 DIAGNOSIS — R079 Chest pain, unspecified: Secondary | ICD-10-CM

## 2022-08-28 DIAGNOSIS — J209 Acute bronchitis, unspecified: Secondary | ICD-10-CM

## 2022-08-28 NOTE — ED Triage Notes (Signed)
Pt reports CP starting from R shoulder shooting toward sternum.  Started approx 30 minutes ago.  Started while at rest and has happened twice in the 30 minutes, is not in pain right now. No other s/s when having the CP but does feel nauseated now. (States everyone in her house has a GI bug.)

## 2023-01-05 ENCOUNTER — Ambulatory Visit
Admission: EM | Admit: 2023-01-05 | Discharge: 2023-01-05 | Disposition: A | Discharge status: Home / Self Care | Payer: Self-pay | Attending: Internal Medicine | Admitting: Internal Medicine

## 2023-01-05 DIAGNOSIS — R051 Acute cough: Secondary | ICD-10-CM

## 2023-01-05 DIAGNOSIS — R0789 Other chest pain: Secondary | ICD-10-CM

## 2023-01-05 MED ORDER — NAPROXEN 500 MG PO TABS: 500.0000 mg | ORAL_TABLET | Freq: Two times a day (BID) | ORAL | 0 refills | Status: DC

## 2023-01-05 MED ORDER — METHOCARBAMOL 500 MG PO TABS: 500.0000 mg | ORAL_TABLET | Freq: Three times a day (TID) | ORAL | 0 refills | Status: DC | PRN

## 2023-01-05 NOTE — Discharge Instructions (Signed)
You were seen today for left-sided chest wall pain and cough.  Your ECG was normal.  Your exam is most consistent with musculoskeletal chest wall pain or possibly costochondritis.  We are treating you with anti-inflammatories twice daily for 1 week, take this with food.  I have also sent in a muscle relaxer for you to take every 8 hours as needed.  Please be aware that this can cause sedation.  Avoid heavy lifting especially on your left side.  Please follow-up with your PCP if symptoms persist or worsen.

## 2023-01-05 NOTE — ED Triage Notes (Addendum)
Left sided chest pain since yesterday. Pain is off and on and feels sore. Hx of anxiety

## 2023-01-05 NOTE — ED Provider Notes (Signed)
[facility]    CSN: [id number] Arrival date & time: 01/05/23  1610      History   Chief Complaint Chief Complaint  Patient presents with   Chest Pain    HPI Erin Wilkinson is a 27 y.o. female with a history of prediabetes and asthma presents to urgent care today with 1 day history of left-sided chest pain and 2 to 3-day history of cough.  The cough has been productive of yellow mucus at times.  She describes the chest pain as soreness worse when you touch in that area.  She denies headache, runny nose, nasal congestion, ear pain, sore throat, shortness of breath, nausea or vomiting.  She has not noticed any rash in the area of concern on her chest.  She denies fever, chills or bodyaches.  She has tried drinking hot tea with minimal relief of symptoms.  She does have a history of anxiety.  She reports her son has been sick and is not sure if she caught something from him.  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 Authorizing Provider  methocarbamol (ROBAXIN) 500 MG tablet Take 1 tablet (500 mg total) by mouth every 8 (eight) hours as needed for muscle spasms. 01/05/23  Yes Lorre Munroe, NP  naproxen (NAPROSYN) 500 MG tablet Take 1 tablet (500 mg total) by mouth 2 (two) times daily. 01/05/23  Yes Shauntell Iglesia, Salvadore Oxford, NP    Family History Family History  Problem Relation Age of Onset   Asthma Mother    Depression Mother    Miscarriages / [place] Mother    Cancer Mother    Cancer Maternal Uncle    Diabetes Maternal Grandmother    Hypertension Maternal Grandmother    Stroke Paternal Grandmother    Miscarriages / Stillbirths Sister    Heart disease Paternal Aunt    Heart disease Paternal Uncle     Social History Social History   Tobacco Use   Smoking status: Never   Smokeless tobacco: Never  Vaping Use   Vaping status: Never Used  Substance Use Topics   Alcohol use: No    Alcohol/week: 0.0 standard drinks of alcohol   Drug use: No     Allergies   Patient has no known allergies.   Review of Systems Review of Systems  Constitutional:  Negative for chills, fatigue and fever.  HENT:  Negative for congestion, ear pain, rhinorrhea, sinus pressure, sinus pain and sore throat.   Respiratory:  Positive for cough. Negative for shortness of breath.   Cardiovascular:  Positive for chest pain.  Gastrointestinal:  Negative for diarrhea, nausea and vomiting.  Musculoskeletal:  Positive for myalgias. Negative for arthralgias.  Skin:  Negative for rash.  Neurological:  Negative for weakness, light-headedness and headaches.     Physical Exam Triage Vital Signs ED Triage Vitals [01/05/23 1618]  Encounter Vitals Group     BP      Systolic BP Percentile      Diastolic BP Percentile       Pulse      Resp      Temp      Temp src      SpO2      Weight 195 lb (88.5 kg)     Height      Head Circumference      Peak Flow      Pain Score 5     Pain Loc      Pain Education      Exclude from Growth Chart    No data found.  Updated Vital Signs BP 118/78 (BP Location: Right Arm)   Pulse 88   Temp 98.4 F (36.9 C) (Oral)   Resp 17   Wt 195 lb (88.5 kg)   SpO2 97%   BMI 36.84 kg/m      Physical Exam Constitutional:      General: She is not in acute distress.    Appearance: She is obese.  HENT:     Head: Normocephalic.  Cardiovascular:     Rate and Rhythm: Normal rate and regular rhythm.     Heart sounds: Normal heart sounds. No murmur heard. Pulmonary:     Effort: Pulmonary effort is normal. No respiratory distress.     Breath sounds: Normal breath sounds.  Chest:     Chest wall: Tenderness present.  Skin:    General: Skin is warm and dry.     Findings: No rash.  Neurological:     General: No focal deficit present.     Mental Status: She is alert and oriented to person, place, and time.      UC Treatments / Results  Labs   EKG   Radiology No results found.  Procedures ED ECG REPORT   Date: 01/05/2023  EKG Time: 4:41 PM  Rate: 80  Rhythm: normal sinus rhythm,  unchanged from previous tracings, nonspecific T wave abnormality  Axis: Normal  Intervals:none  ST&T Change: None  Narrative Interpretation: Regular rate and rhythm, no acute findings   Medications Ordered in UC Medications - No data to display  Initial Impression / Assessment and Plan / UC Course  I have reviewed the triage vital signs and the nursing notes.  Pertinent labs & imaging results that were available during my care of the patient were reviewed by me and considered in my medical decision making (see chart for details).     27 year old female with 3-day history of cough and left-sided chest pain.  DDx include asthma exacerbation, acute  viral bronchitis, pneumonia,  chest wall pain, GERD, acute coronary syndrome.  ECG shows regular rate and rhythm with no acute findings.  On exam, her left chest wall is tender to palpation and her lungs are clear.  Discussed chest x-ray to rule out a pneumonia however her lung exam is clear and she is afebrile so after discussion we decided to hold off on this.  No indication to check labs at this time.  Will treat for musculoskeletal chest wall pain with naproxen 500 mg twice daily and methocarbamol 500 mg 3 times daily as needed-Tatian caution given.  Advised her to follow-up if symptoms persist or worsen.  Final Clinical Impressions(s) / UC Diagnoses   Final diagnoses:  Left-sided chest wall pain  Acute cough     Discharge Instructions      You were seen today for left-sided chest wall pain and cough.  Your ECG was normal.  Your exam is most consistent with musculoskeletal chest wall pain or possibly costochondritis.  We are treating you with anti-inflammatories twice daily for 1 week, take this with food.  I have also sent in a muscle relaxer for you to take every 8 hours as needed.  Please be aware that this can cause sedation.  Avoid heavy lifting especially on your left side.  Please follow-up with your PCP if symptoms persist or worsen.     ED Prescriptions     Medication Sig Dispense Auth. Provider   naproxen (NAPROSYN) 500 MG tablet Take 1 tablet (500 mg total) by mouth 2 (two) times daily. 14 tablet Lorre Munroe, NP   methocarbamol (ROBAXIN) 500 MG tablet Take 1 tablet (500 mg total) by mouth every 8 (eight) hours as needed for muscle spasms. 15 tablet Lorre Munroe, NP      PDMP not reviewed this encounter.   Lorre Munroe, NP 01/05/23 1642

## 2023-01-12 ENCOUNTER — Other Ambulatory Visit: Payer: Medicaid Other

## 2023-01-19 ENCOUNTER — Other Ambulatory Visit: Payer: Medicaid Other

## 2023-02-12 ENCOUNTER — Ambulatory Visit
Admission: EM | Admit: 2023-02-12 | Discharge: 2023-02-12 | Disposition: A | Discharge status: Home / Self Care | Payer: Medicaid Other | Attending: Physician Assistant | Admitting: Physician Assistant

## 2023-02-12 DIAGNOSIS — G44209 Tension-type headache, unspecified, not intractable: Secondary | ICD-10-CM

## 2023-02-12 MED ORDER — NAPROXEN 500 MG PO TABS
500.0000 mg | ORAL_TABLET | Freq: Two times a day (BID) | ORAL | 0 refills | Status: AC | PRN
Start: 2023-02-12 — End: ?

## 2023-02-12 NOTE — Discharge Instructions (Signed)
-  Likely tension type headache. - Increase rest and fluids.  I sent anti-inflammatory medicine to the pharmacy.  You can also try Tylenol, heat, muscle rubs on your neck and stretches. - If your headaches are persistent or you ever develop worsening/severe headache, dizziness, vision changes, vomiting, numbness/tingling, feel faint or fall, have unwanted/unexpected weight changes, etc. or you does have concerns about persistent headaches please follow-up with your primary care provider.  For any acute worsening of symptoms please go to the ER.

## 2023-02-12 NOTE — ED Provider Notes (Signed)
[facility]    CSN: [id number] Arrival date & time: 02/12/23  1935      History   Chief Complaint No chief complaint on file.   HPI Erin Wilkinson is a 27 y.o. female presenting for 1.5 month history of intermittent headaches. Currently she is having a headaches of the left frontal region. Denies dizziness, nausea, photophobia, vomiting, numbness/tingling. No recent illness or fever. OTC meds have not been helpful. History of headaches.  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 Authorizing Provider  methocarbamol (ROBAXIN) 500 MG tablet Take 1 tablet (500 mg total) by mouth every 8 (eight) hours as needed for muscle spasms. 01/05/23   Lorre Munroe, NP  naproxen (NAPROSYN) 500 MG tablet Take 1 tablet (500 mg  total) by mouth 2 (two) times daily. 01/05/23   Lorre Munroe, NP    Family History Family History  Problem Relation Age of Onset   Asthma Mother    Depression Mother    Miscarriages / [place] Mother    Cancer Mother    Cancer Maternal Uncle    Diabetes Maternal Grandmother    Hypertension Maternal Grandmother    Stroke Paternal Grandmother    Miscarriages / Stillbirths Sister    Heart disease Paternal Aunt    Heart disease Paternal Uncle     Social History Social History   Tobacco Use   Smoking status: Never   Smokeless tobacco: Never  Vaping Use   Vaping status: Never Used  Substance Use Topics   Alcohol use: No    Alcohol/week: 0.0 standard drinks of alcohol   Drug use: Not Currently    Types: Marijuana    Comment: quit September 2024     Allergies   Patient has no known allergies.   Review of Systems Review of Systems  Constitutional:  Negative for chills, diaphoresis, fatigue and fever.  HENT:  Negative for congestion, rhinorrhea, sinus pain and sore throat.   Eyes:  Negative for photophobia and visual disturbance.  Respiratory:  Negative for cough.   Gastrointestinal:  Negative for nausea and vomiting.  Skin:  Negative for rash.  Neurological:  Positive for headaches. Negative for dizziness, syncope, weakness, light-headedness and numbness.  Hematological:  Negative for adenopathy.  Psychiatric/Behavioral:  Negative for confusion.      Physical Exam Triage Vital Signs ED Triage Vitals  Encounter Vitals Group     BP      Systolic BP Percentile      Diastolic BP Percentile      Pulse      Resp      Temp      Temp src      SpO2      Weight      Height      Head Circumference      Peak Flow      Pain Score      Pain Loc      Pain Education      Exclude from Growth Chart    No data found.  Updated Vital Signs Ht 5\' 2"  (1.575 m)   Wt 190 lb (86.2 kg)   LMP 01/18/2023   BMI 34.75 kg/m     Physical Exam Vitals and nursing note  reviewed.  Constitutional:      General: She is not in acute distress.    Appearance: Normal appearance. She is not ill-appearing or toxic-appearing.  HENT:     Head: Normocephalic and atraumatic.     Nose: Nose normal.     Mouth/Throat:     Mouth: Mucous membranes are moist.     Pharynx: Oropharynx is clear.  Eyes:     General: No scleral icterus.       Right eye: No discharge.        Left eye: No discharge.     Conjunctiva/sclera: Conjunctivae normal.  Cardiovascular:     Rate and Rhythm: Normal rate and regular rhythm.     Heart sounds: Normal heart sounds.  Pulmonary:     Effort: Pulmonary effort is normal. No respiratory distress.     Breath sounds: Normal breath sounds.  Musculoskeletal:     Cervical back: Neck supple.  Skin:    General: Skin is dry.  Neurological:     General: No focal deficit present.     Mental Status: She is alert. Mental status is at baseline.     Motor: No weakness.     Gait: Gait normal.  Psychiatric:        Mood and Affect: Mood normal.        Behavior: Behavior normal.        Thought Content: Thought content normal.      UC Treatments / Results  Labs (all labs ordered are listed, but only abnormal results are displayed) Labs Reviewed - No data to display  EKG   Radiology No results found.  Procedures Procedures (including critical care time)  Medications Ordered in UC Medications - No data to display  Initial Impression / Assessment and Plan / UC Course  I have reviewed the triage vital signs and the nursing notes.  Pertinent labs & imaging results that were available during my care of the patient were reviewed by me and considered in my medical decision making (see chart for details).     *** Final Clinical Impressions(s) / UC Diagnoses   Final diagnoses:  None   Discharge Instructions   None    ED Prescriptions   None    PDMP not reviewed  this encounter.

## 2023-02-12 NOTE — ED Triage Notes (Signed)
Patient states she have been having head pains x 1.5 months. Varies locations, top of neck,left side tonight. No treatment used, no injury that she is aware of.
